# Patient Record
Sex: Female | Born: 1970 | Race: White | Hispanic: No | Marital: Married | Smoking: Former smoker
Health system: Southern US, Community
[De-identification: ages and names within clinical notes are randomized; demographics above are authoritative.]

## PROBLEM LIST (undated history)

## (undated) DIAGNOSIS — G119 Hereditary ataxia, unspecified: Secondary | ICD-10-CM

## (undated) DIAGNOSIS — F909 Attention-deficit hyperactivity disorder, unspecified type: Secondary | ICD-10-CM

## (undated) DIAGNOSIS — B009 Herpesviral infection, unspecified: Secondary | ICD-10-CM

## (undated) DIAGNOSIS — N920 Excessive and frequent menstruation with regular cycle: Secondary | ICD-10-CM

## (undated) DIAGNOSIS — C8599 Non-Hodgkin lymphoma, unspecified, extranodal and solid organ sites: Secondary | ICD-10-CM

## (undated) DIAGNOSIS — F329 Major depressive disorder, single episode, unspecified: Secondary | ICD-10-CM

## (undated) DIAGNOSIS — F17201 Nicotine dependence, unspecified, in remission: Secondary | ICD-10-CM

## (undated) DIAGNOSIS — F419 Anxiety disorder, unspecified: Secondary | ICD-10-CM

## (undated) HISTORY — PX: COLONOSCOPY: SHX174

## (undated) HISTORY — DX: Excessive and frequent menstruation with regular cycle: N92.0

## (undated) HISTORY — DX: Herpesviral infection, unspecified: B00.9

## (undated) HISTORY — DX: Non-Hodgkin lymphoma, unspecified, extranodal and solid organ sites: C85.99

## (undated) HISTORY — DX: Anxiety disorder, unspecified: F41.9

## (undated) HISTORY — DX: Nicotine dependence, unspecified, in remission: F17.201

## (undated) HISTORY — DX: Major depressive disorder, single episode, unspecified: F32.9

## (undated) HISTORY — PX: BASAL CELL CARCINOMA EXCISION: SHX1214

## (undated) HISTORY — PX: TUBAL LIGATION: SHX77

## (undated) HISTORY — DX: Attention-deficit hyperactivity disorder, unspecified type: F90.9

## (undated) HISTORY — DX: Hereditary ataxia, unspecified: G11.9

---

## 1997-12-25 ENCOUNTER — Inpatient Hospital Stay (HOSPITAL_COMMUNITY): Admission: AD | Admit: 1997-12-25 | Discharge: 1997-12-27 | Payer: Self-pay | Admitting: Obstetrics & Gynecology

## 1998-06-22 ENCOUNTER — Emergency Department (HOSPITAL_COMMUNITY): Admission: EM | Admit: 1998-06-22 | Discharge: 1998-06-22 | Payer: Self-pay | Admitting: Emergency Medicine

## 1999-02-15 ENCOUNTER — Other Ambulatory Visit: Admission: RE | Admit: 1999-02-15 | Discharge: 1999-02-15 | Payer: Self-pay | Admitting: Obstetrics and Gynecology

## 1999-09-08 ENCOUNTER — Encounter (INDEPENDENT_AMBULATORY_CARE_PROVIDER_SITE_OTHER): Payer: Self-pay

## 1999-09-08 ENCOUNTER — Ambulatory Visit (HOSPITAL_COMMUNITY): Admission: AD | Admit: 1999-09-08 | Discharge: 1999-09-08 | Payer: Self-pay | Admitting: Obstetrics and Gynecology

## 1999-09-08 ENCOUNTER — Encounter: Payer: Self-pay | Admitting: Obstetrics and Gynecology

## 2000-05-07 ENCOUNTER — Other Ambulatory Visit: Admission: RE | Admit: 2000-05-07 | Discharge: 2000-05-07 | Payer: Self-pay | Admitting: Obstetrics and Gynecology

## 2001-09-09 ENCOUNTER — Inpatient Hospital Stay (HOSPITAL_COMMUNITY): Admission: AD | Admit: 2001-09-09 | Discharge: 2001-09-12 | Payer: Self-pay | Admitting: Obstetrics and Gynecology

## 2001-10-18 ENCOUNTER — Other Ambulatory Visit: Admission: RE | Admit: 2001-10-18 | Discharge: 2001-10-18 | Payer: Self-pay | Admitting: Obstetrics and Gynecology

## 2002-04-28 ENCOUNTER — Ambulatory Visit: Admission: RE | Admit: 2002-04-28 | Discharge: 2002-05-12 | Payer: Self-pay | Admitting: Radiation Oncology

## 2002-09-01 ENCOUNTER — Encounter: Admission: RE | Admit: 2002-09-01 | Discharge: 2002-09-01 | Payer: Self-pay | Admitting: Oncology

## 2002-09-01 ENCOUNTER — Encounter: Payer: Self-pay | Admitting: Oncology

## 2002-12-23 ENCOUNTER — Other Ambulatory Visit: Admission: RE | Admit: 2002-12-23 | Discharge: 2002-12-23 | Payer: Self-pay | Admitting: Obstetrics and Gynecology

## 2004-08-16 ENCOUNTER — Other Ambulatory Visit: Admission: RE | Admit: 2004-08-16 | Discharge: 2004-08-16 | Payer: Self-pay | Admitting: Obstetrics and Gynecology

## 2004-09-08 ENCOUNTER — Ambulatory Visit (HOSPITAL_COMMUNITY): Admission: RE | Admit: 2004-09-08 | Discharge: 2004-09-08 | Payer: Self-pay | Admitting: Obstetrics and Gynecology

## 2005-03-16 ENCOUNTER — Ambulatory Visit: Payer: Self-pay | Admitting: Oncology

## 2005-09-08 ENCOUNTER — Ambulatory Visit: Payer: Self-pay | Admitting: Oncology

## 2006-06-25 ENCOUNTER — Other Ambulatory Visit: Admission: RE | Admit: 2006-06-25 | Discharge: 2006-06-25 | Payer: Self-pay | Admitting: Gynecology

## 2007-07-22 ENCOUNTER — Other Ambulatory Visit: Admission: RE | Admit: 2007-07-22 | Discharge: 2007-07-22 | Payer: Self-pay | Admitting: Gynecology

## 2007-08-28 HISTORY — PX: HYSTEROSCOPY: SHX211

## 2007-09-12 ENCOUNTER — Ambulatory Visit (HOSPITAL_BASED_OUTPATIENT_CLINIC_OR_DEPARTMENT_OTHER): Admission: RE | Admit: 2007-09-12 | Discharge: 2007-09-12 | Payer: Self-pay | Admitting: Gynecology

## 2007-09-12 ENCOUNTER — Encounter: Payer: Self-pay | Admitting: Gynecology

## 2008-03-10 ENCOUNTER — Ambulatory Visit: Payer: Self-pay | Admitting: Gynecology

## 2008-06-04 ENCOUNTER — Ambulatory Visit: Payer: Self-pay | Admitting: Gynecology

## 2008-06-29 HISTORY — PX: ENDOMETRIAL ABLATION: SHX621

## 2008-07-14 ENCOUNTER — Ambulatory Visit: Payer: Self-pay | Admitting: Gynecology

## 2008-07-17 ENCOUNTER — Ambulatory Visit: Payer: Self-pay | Admitting: Gynecology

## 2008-08-06 ENCOUNTER — Ambulatory Visit: Payer: Self-pay | Admitting: Gynecology

## 2008-11-11 ENCOUNTER — Other Ambulatory Visit: Admission: RE | Admit: 2008-11-11 | Discharge: 2008-11-11 | Payer: Self-pay | Admitting: Gynecology

## 2008-11-11 ENCOUNTER — Ambulatory Visit: Payer: Self-pay | Admitting: Gynecology

## 2008-11-11 ENCOUNTER — Encounter: Payer: Self-pay | Admitting: Gynecology

## 2010-07-07 ENCOUNTER — Other Ambulatory Visit (HOSPITAL_COMMUNITY)
Admission: RE | Admit: 2010-07-07 | Discharge: 2010-07-07 | Disposition: A | Discharge status: Home / Self Care | Payer: BC Managed Care – PPO | Source: Ambulatory Visit | Attending: Gynecology | Admitting: Gynecology

## 2010-07-07 ENCOUNTER — Other Ambulatory Visit: Payer: Self-pay | Admitting: Gynecology

## 2010-07-07 ENCOUNTER — Encounter: Payer: BC Managed Care – PPO | Admitting: Gynecology

## 2010-07-07 DIAGNOSIS — Z124 Encounter for screening for malignant neoplasm of cervix: Secondary | ICD-10-CM | POA: Insufficient documentation

## 2010-07-07 DIAGNOSIS — Z833 Family history of diabetes mellitus: Secondary | ICD-10-CM

## 2010-07-07 DIAGNOSIS — R82998 Other abnormal findings in urine: Secondary | ICD-10-CM

## 2010-07-07 DIAGNOSIS — Z01419 Encounter for gynecological examination (general) (routine) without abnormal findings: Secondary | ICD-10-CM

## 2010-07-07 DIAGNOSIS — Z13220 Encounter for screening for lipoid disorders: Secondary | ICD-10-CM

## 2010-07-11 ENCOUNTER — Other Ambulatory Visit: Payer: Self-pay | Admitting: Dermatology

## 2010-10-11 NOTE — Op Note (Signed)
NAMEBRADLEE, [name]               ACCOUNT NO.:  [id number]   MEDICAL RECORD NO.:  [id number]          PATIENT TYPE:  AMB   LOCATION:  [facility]                         FACILITY:  [facility]   PHYSICIAN:  Timothy P. Fontaine, M.D.DATE OF BIRTH:  15-Oct-1970   DATE OF PROCEDURE:  09/12/2007  DATE OF DISCHARGE:                               OPERATIVE REPORT   PREOPERATIVE DIAGNOSES:  1. Post coital bleeding.  2. Menorrhagia.  3. Endometrial polyp.   POSTOPERATIVE DIAGNOSES:  1. Post coital bleeding.  2. Menorrhagia.  3. Endometrial polyp.   PROCEDURE:  1. Hysteroscopic resection of endometrial polyp.  2. D&C.   SURGEON:  Timothy P. Fontaine, M.D.   ANESTHETIC:  General with 1% lidocaine paracervical block.   ESTIMATED BLOOD LOSS:  Minimal sorbitol discrepancy, 35 mL.   COMPLICATIONS:  None.   SPECIMEN:  1. Endometrial curetting.  2. Endometrial polyp.   FINDINGS:  EUA, external BUS and vagina normal.  Cervix normal.  Bimanual uterus normal size, midline mobile.  Adnexa without masses.  Hysteroscopic with a classic endometrial polyp, left upper lateral  cavity; resected in its entirety.  Hysteroscopy otherwise was normal.  Adequate; noting fundus anterior-posterior uterine surfaces, lower  uterine segment, endocervical canal, right and left tubal ostia all  visualized.   PROCEDURE:  The patient was taken to the operating room, underwent  general anesthesia and was placed in the low dorsal lithotomy position.  She received a perineal vaginal preparation with Betadine solution.  The  bladder was emptied with in-and-out Foley catheterization.  EUA  performed.  The patient was draped in the usual fashion.  The cervix was  visualized with a surgical speculum.  The anterior lip was grasped with  a single-tooth tenaculum and a paracervical block using 1% lidocaine  total of 8 mL was placed.  Cervix was then gently and gradually dilated  to admit the operative hysteroscope.   Hysteroscopy was performed with  findings noted above.  Using the right-angle resectoscopic loop, the  endometrial polyp was resected at its base.  Subsequently, a sharp  curettage was performed  without difficulty.  Re-hysteroscopy showed an empty cavity, good  distention, no evidence of perforation.  The instruments were removed.  Hemostasis visualized.  Speculum removed.  The patient placed in the  supine position and awakened without difficulty; taken to the recovery  room in good condition, having tolerated the procedure well.      Timothy P. Fontaine, M.D.  Electronically Signed     TPF/MEDQ  D:  09/12/2007  T:  09/12/2007  Job:  [id number]

## 2010-10-11 NOTE — H&P (Signed)
[name], [name]               ACCOUNT NO.:  [id number]   MEDICAL RECORD NO.:  [id number]          PATIENT TYPE:  AMB   LOCATION:  [facility]                         FACILITY:  [facility]   PHYSICIAN:  Timothy P. Fontaine, M.D.DATE OF BIRTH:  01-Oct-1970   DATE OF ADMISSION:  09/12/2007  DATE OF DISCHARGE:                              HISTORY & PHYSICAL   CHIEF COMPLAINT:  Menorrhagia, postcoital bleeding.   HISTORY OF PRESENT ILLNESS:  A 40 year old G46, P2, AB two female status  post tubal sterilization presents complaining of heavier menses and  postcoital bleeding.  Outpatient evaluation included a sonohysterogram  which showed an endometrial defect consistent with polyp versus small  myomas. She is admitted for hysteroscopy, resection of intracavitary  abnormalities, dilatation and curettage.   PAST MEDICAL HISTORY:  Significant for non-Hodgkin's lymphoma of the  skin.   PAST SURGICAL HISTORY:  Includes laparoscopic bilateral tubal ligation.   ALLERGIES:  No medications.   CURRENT MEDICATIONS:  None.   REVIEW OF SYSTEMS:  Noncontributory.   FAMILY HISTORY:  Noncontributory.   SOCIAL HISTORY:  Noncontributory.   PHYSICAL EXAMINATION:  VITAL SIGNS:  Afebrile.  Vital signs stable.  HEENT: Normal.  LUNGS:  Clear.  CARDIAC:  Regular rate.  No rubs, murmurs or gallops.  ABDOMEN:  Exam benign.  PELVIC:  External BUS, vagina normal.  Cervix normal.  Uterus normal  size, midline mobile, nontender.  Adnexa without masses or tenderness.   ASSESSMENT:  A 40 year old with increasing menorrhagia, postcoital  bleeding.  Sonohysterogram shows an endometrial defect consistent with a  small polyp versus myoma.  The patient is admitted for hysteroscopy,  resection, dilatation and curettage.  I reviewed with the patient the  proposed surgery, expected intraoperative, postoperative courses,  instrumentation, use of the resectoscope and dilatation and curettage.  She understands there are no  guarantees as far as menorrhagia or  postcoital bleeding, that her bleeding may continue irregular heavy.  The risks of bleeding, transfusion, infection, uterine perforation,  damage to internal organs including bowel, bladder, ureters,  vessels and nerves necessitating major exploratory reparative surgeries,  future reparative surgeries, bowel resection, ostomy formation all  discussed, understood and accepted.  The patient's questions were  answered to her satisfaction.  She is ready to proceed with surgery.      Timothy P. Fontaine, M.D.  Electronically Signed     TPF/MEDQ  D:  09/09/2007  T:  09/09/2007  Job:  [id number]

## 2010-10-14 NOTE — Op Note (Signed)
[facility]  Patient:    [name], [name]                      MRN: [id number] Proc. Date: 09/09/99 Adm. Date:  [id number] Attending:  Conley Simmonds A                           Operative Report  PREOPERATIVE DIAGNOSIS:           Missed abortion at [redacted] weeks gestation.  POSTOPERATIVE DIAGNOSIS:          Missed abortion at [redacted] weeks gestation.  OPERATION:                        Examination under anesthesia, dilatation and evacuation.  SURGEON:                          Brook A. Edward Jolly, M.D.  ANESTHESIA:                       MAC, paracervical block with 1% lidocaine.  INTRAVENOUS FLUIDS:               850 cc Ringers lactate.  ESTIMATED BLOOD LOSS:             50 cc.  URINE OUTPUT:                     75 cc prior to the procedure.  COMPLICATIONS:                    None.  INDICATIONS:                      The patient is a 40 year old gravida 3, para 1-0-1-1 Caucasian female at [redacted] weeks gestation by a six-week ultrasound, who presented to the emergency department complaining of brown vaginal spotting, which became red in nature and was accompanied by pressure the evening prior.  The patient had an ultrasound in the office approximately 3 weeks prior, which documented an intrauterine pregnancy with positive fetal cardiac activity. Examination in the emergency department demonstrated clot in the vagina and a closed cervix.  The uterus was noted to be approximately 9 weeks size and was retroverted and nontender.  There was fullness appreciated in the right adnexa region.  The patient had a pelvic ultrasound, which documented an 8+ [redacted] weeks gestation with no fetal cardiac activity appreciated.  No adnexal masses were noted.  The patients hematocrit was 37.8% and her blood type was confirmed to be A+.  The patient was educated to her diagnosis, and a discussion was held with the patient regarding expectant management for ______ dilation and evacuation.   The  patient chose to proceed with a dilation and evacuation after the risks and benefits were reviewed.  FINDINGS:                         Examination under anesthesia revealed an 8-9 eek size retroverted uterus.  No adnexal masses were appreciated.  A moderate amount of products of conception were obtained.  SPECIMENS:                        Products of conception were sent to pathology.  PROCEDURE:  With an IV in place, the patient was taken to the operating room, after she was properly identified.  The patient received ceftazidime 1 g intravenously prior to her surgery.  The patient received MAC anesthesia, and she was then placed in the dorsal lithotomy position.  Her perineum and vagina were sterilely prepped and draped, and the in and out catheterization of the bladder was performed.  Examination under  anesthesia was then performed.  The findings were as noted above.  A speculum was placed inside the vagina, and a single-tooth tenaculum was placed on the anterior cervical lip.  The paracervical block was performed with 10 cc of % lidocaine in standard fashion.  The cervix was serially dilated to a #25 Pratt dilator.  A suction tip curet was then placed into the uterine cavity to the level of the fundus and was slowly withdrawn in a clockwise fashion after suction was  applied.  This was repeated a second time.  The cervix was then sharply curetted and the remaining products of conception were removed.  At the end of this curettage, the endometrium had a characteristically gritty texture to it.  The suction tip curet was placed one final time to remove any remaining clot inside the uterine cavity.  Hemostasis was noted to be excellent at the end of the procedure. The single-tooth tenaculum was removed from the cervix, and the speculum was taken out of the vagina.  The patient was taken out of the dorsal lithotomy position.  She was  escorted to the recovery room in stable and awake condition.  There were no complications to the procedure.  All needle, instrument, and lap counts were correct. DD:  09/09/99 TD:  09/09/99 Job: [id number]

## 2010-10-14 NOTE — Op Note (Signed)
NAMEKRYSTEN, [name]               ACCOUNT NO.:  [id number]   MEDICAL RECORD NO.:  [id number]          PATIENT TYPE:  AMB   LOCATION:  [facility]                           FACILITY:  [facility]   PHYSICIAN:  Malva Limes, M.D.    DATE OF BIRTH:  [birth year]   DATE OF PROCEDURE:  09/08/2004  DATE OF DISCHARGE:                                 OPERATIVE REPORT   PREOPERATIVE DIAGNOSIS:  The patient desires permanent sterilization.   POSTOPERATIVE DIAGNOSIS:  The patient desires permanent sterilization.   PROCEDURE:  Laparoscopic bilateral tubal ligation, application of Hulka  clips.   SURGEON:  Dr. Dareen Piano   ANESTHESIA:  General with local.   SPECIMENS:  None.   ANTIBIOTICS:  Ancef 1 g.   DRAINS:  Red rubber catheter to bladder.   COMPLICATIONS:  None.   ESTIMATED BLOOD LOSS:  Minimal.   DESCRIPTION OF PROCEDURE:  The patient was taken to the operating room,  where a general anesthetic was administered without complications.  She was  then placed in dorsolithotomy position.  She was prepped with Hibiclens then  draped in the usual fashion for this procedure.  A Hulka tenaculum was  applied to the anterior cervical lip.  The umbilicus was injected with 1%  lidocaine.  A vertical skin incision was made.  The Veress needle was placed  in the peritoneal cavity.  Carbon dioxide, 3 L, was then insufflated.  A  trocar was then placed into the peritoneal cavity.  The scope was placed.  The liver and gallbladder appeared to be normal.  The appendix was not  visualized.  The patient had no evidence of any adhesions, endometriosis.  The fallopian tubes and ovaries were normal.  At this point, the Hulka clip  applicator was set up.  A Hulka clip was placed on the left fallopian tube  in the isthmic portion.  The clip was applied perpendicular to the tube.  The entire tube appeared to be within the clasp.  The clasp appeared to be  tightly closed.  A similar procedure was performed on the opposite  side.  At  this point, the procedure was concluded.  The instruments were removed;  pneumoperitoneum was released, and the fascia was  closed with interrupted 0 Vicryl suture and the skin with 4-0 Vicryl suture.  The patient tolerated the procedure well.  She was taken to the recovery  room in stable condition.  Instrument and lap counts correct x 1.  The  patient will be discharged to home with Tylox to take p.r.n.  She will be  instructed to follow up in the office in 4 weeks.      MA/MEDQ  D:  09/08/2004  T:  09/08/2004  Job:  [id number]

## 2011-10-09 ENCOUNTER — Encounter: Payer: Self-pay | Admitting: *Deleted

## 2011-10-12 ENCOUNTER — Encounter: Payer: BC Managed Care – PPO | Admitting: Gynecology

## 2013-01-17 ENCOUNTER — Ambulatory Visit (INDEPENDENT_AMBULATORY_CARE_PROVIDER_SITE_OTHER): Payer: PRIVATE HEALTH INSURANCE | Admitting: Family Medicine

## 2013-01-17 ENCOUNTER — Encounter: Payer: Self-pay | Admitting: Family Medicine

## 2013-01-17 VITALS — BP 116/69 | HR 65 | Temp 99.2°F | Resp 16 | Ht 62.5 in | Wt 133.0 lb

## 2013-01-17 DIAGNOSIS — F330 Major depressive disorder, recurrent, mild: Secondary | ICD-10-CM

## 2013-01-17 DIAGNOSIS — F320 Major depressive disorder, single episode, mild: Secondary | ICD-10-CM | POA: Insufficient documentation

## 2013-01-17 DIAGNOSIS — R5381 Other malaise: Secondary | ICD-10-CM

## 2013-01-17 LAB — CBC WITH DIFFERENTIAL/PLATELET
Basophils Absolute: 0 10*3/uL (ref 0.0–0.1)
Basophils Relative: 0.5 % (ref 0.0–3.0)
Eosinophils Absolute: 0.1 10*3/uL (ref 0.0–0.7)
Eosinophils Relative: 1.7 % (ref 0.0–5.0)
HCT: 38.7 % (ref 36.0–46.0)
Hemoglobin: 13.1 g/dL (ref 12.0–15.0)
Lymphocytes Relative: 29.2 % (ref 12.0–46.0)
Lymphs Abs: 2.1 10*3/uL (ref 0.7–4.0)
MCHC: 33.8 g/dL (ref 30.0–36.0)
MCV: 88.1 fl (ref 78.0–100.0)
Monocytes Absolute: 0.5 10*3/uL (ref 0.1–1.0)
Monocytes Relative: 7.6 % (ref 3.0–12.0)
Neutro Abs: 4.4 10*3/uL (ref 1.4–7.7)
Neutrophils Relative %: 61 % (ref 43.0–77.0)
Platelets: 297 10*3/uL (ref 150.0–400.0)
RBC: 4.39 Mil/uL (ref 3.87–5.11)
RDW: 14.1 % (ref 11.5–14.6)
WBC: 7.2 10*3/uL (ref 4.5–10.5)

## 2013-01-17 LAB — COMPREHENSIVE METABOLIC PANEL
ALT: 16 U/L (ref 0–35)
Alkaline Phosphatase: 89 U/L (ref 39–117)
Creatinine, Ser: 0.6 mg/dL (ref 0.4–1.2)
Glucose, Bld: 85 mg/dL (ref 70–99)
Sodium: 137 mEq/L (ref 135–145)
Total Bilirubin: 0.6 mg/dL (ref 0.3–1.2)
Total Protein: 7 g/dL (ref 6.0–8.3)

## 2013-01-17 LAB — TSH: TSH: 1.34 u[IU]/mL (ref 0.35–5.50)

## 2013-01-17 MED ORDER — CITALOPRAM HYDROBROMIDE 20 MG PO TABS: 20.0000 mg | ORAL_TABLET | Freq: Every day | ORAL | Status: DC

## 2013-01-17 NOTE — Assessment & Plan Note (Addendum)
Start citalopram 20mg  qd trial.  Therapeutic expectations and side effect profile of medication discussed today.  Patient's questions answered. I mentioned the possibility of counseling as add on therapy but will bring this up more at next f/u visit. Other dx to consider: GAD and adult ADHD.

## 2013-01-17 NOTE — Progress Notes (Signed)
Office Note 01/17/2013  CC:  Chief Complaint  Patient presents with  . Establish Care  . Fatigue    HPI:  [name] is a 42 y.o. White female who is here to establish care. Patient's most recent primary MD:  [facility] and Dr. Gita Kudo (GYN)--but has not had medical care for a couple of years per her estimate. Old records in EPIC/HL were reviewed prior to or during today's visit.  Pt reports approx 18 mo hx of feeling sad and depressed.  Says the statement "I don't care" sums up her attitude. Irritable to the point of yelling at husband and children at times, can't concentrate, gets very frustrated b/c can't finish tasks, feels overwhelmed at the end of each day b/c nothing is done, isolates herself and just wants to sleep, appetite is down some, energy level down.  Denies crying spells, SI or HI, or FH of depression. No situation caused her to feel this way.  However, these feelings did lead her to quit a good paying job--"over a small argument/reason".  She has since started working again.   Says she has never sought treatment for this type of thing in the past.  Past Medical History  Diagnosis Date  . HSV-2 (herpes simplex virus 2) infection   . Non-Hodgkin's lymphoma of skin     Initial dx age 80--treated with chemo.  Recurrence x 2 after the birth of each of her children--different chemo regimen used.  No recurrence since age 12 or so.  . Menorrhagia     Regular: 7 days of bleeding (2 heavy and 5 light).    Past Surgical History  Procedure Laterality Date  . Tubal ligation    . Hysteroscopy  08/2007    polyp  . Endometrial ablation  06/2008  . Colonoscopy  appox age [age] yrs    Normal    Family History  Problem Relation Age of Onset  . Cancer Paternal Uncle     colon  . Alcohol abuse Maternal Grandfather   . Cancer Paternal Grandmother   . Heart disease Paternal Grandfather     History   Social History  . Marital Status: Married    Spouse Name:  N/A    Number of Children: N/A  . Years of Education: N/A   Occupational History  . Not on file.   Social History Main Topics  . Smoking status: Former Games developer  . Smokeless tobacco: Never Used  . Alcohol Use: Yes     Comment: social  . Drug Use: No  . Sexual Activity: Yes    Birth Control/ Protection: Surgical   Other Topics Concern  . Not on file   Social History Narrative   Married, 2 children.   Orig from [place] area of [place].   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   Tob 15 pack yr hx--current as of 12/2012.    Alcohol: a few drinks on weekend.   Drugs: none   MEDS: none  No Known Allergies  ROS Review of Systems  Constitutional: Positive for fatigue. Negative for fever.  HENT: Negative for congestion and sore throat.   Eyes: Negative for visual disturbance.  Respiratory: Negative for cough.   Cardiovascular: Negative for chest pain.  Gastrointestinal: Negative for nausea and abdominal pain.  Genitourinary: Negative for dysuria.  Musculoskeletal: Negative for back pain and joint swelling.       Intermittent LBP and shoulder pains  Skin: Negative for rash.  Neurological: Negative for weakness and headaches.  Hematological:  Negative for adenopathy.  Psychiatric/Behavioral:       See HPI     PE; Blood pressure 116/69, pulse 65, temperature 99.2 F (37.3 C), temperature source Temporal, resp. rate 16, height 5' 2.5" (1.588 m), weight 133 lb (60.328 kg), last menstrual period 01/02/2013, SpO2 98.00%. Gen: Alert, well appearing.  Patient is oriented to person, place, time, and situation. AFFECT: pleasant, lucid thought and speech. ENT: Ears: EACs clear, normal epithelium.  TMs with good light reflex and landmarks bilaterally.  Eyes: no injection, icteris, swelling, or exudate.  EOMI, PERRLA. Nose: no drainage or turbinate edema/swelling.  No injection or focal lesion.  Mouth: lips without lesion/swelling.  Oral mucosa pink and moist.  Dentition intact and without obvious caries or  gingival swelling.  Oropharynx without erythema, exudate, or swelling.  Neck - No masses or thyromegaly or limitation in range of motion CV: RRR, no m/r/g.   LUNGS: CTA bilat, nonlabored resps, good aeration in all lung fields. EXT: no clubbing, cyanosis, or edema.  Neuro: CN 2-12 intact bilaterally, strength 5/5 in proximal and distal upper extremities and lower extremities bilaterally.    No tremor.  No ataxia.  Upper extremity and lower extremity DTRs symmetric.  No pronator drift.  Pertinent labs:  None today  ASSESSMENT AND PLAN:   New pt: obtain old records.  Major depressive disorder, single episode, mild Start citalopram 20mg  qd trial.  Therapeutic expectations and side effect profile of medication discussed today.  Patient's questions answered. I mentioned the possibility of counseling as add on therapy but will bring this up more at next f/u visit. Other dx to consider: GAD and adult ADHD.  Also, check CBC, CMET, TSH (fatigue and menorrhagia).  Return in about 1 month (around 02/17/2013) for f/u depression.

## 2013-02-17 ENCOUNTER — Ambulatory Visit (INDEPENDENT_AMBULATORY_CARE_PROVIDER_SITE_OTHER): Payer: PRIVATE HEALTH INSURANCE | Admitting: Family Medicine

## 2013-02-17 ENCOUNTER — Encounter: Payer: Self-pay | Admitting: Family Medicine

## 2013-02-17 VITALS — BP 117/73 | HR 96 | Temp 99.7°F | Resp 16 | Ht 62.0 in | Wt 133.0 lb

## 2013-02-17 DIAGNOSIS — J069 Acute upper respiratory infection, unspecified: Secondary | ICD-10-CM | POA: Insufficient documentation

## 2013-02-17 DIAGNOSIS — Z23 Encounter for immunization: Secondary | ICD-10-CM

## 2013-02-17 DIAGNOSIS — F909 Attention-deficit hyperactivity disorder, unspecified type: Secondary | ICD-10-CM | POA: Insufficient documentation

## 2013-02-17 MED ORDER — METHYLPHENIDATE HCL ER (OSM) 18 MG PO TBCR: 18.0000 mg | EXTENDED_RELEASE_TABLET | ORAL | Status: DC

## 2013-02-17 NOTE — Addendum Note (Signed)
Addended by: Eulah Pont on: 02/17/2013 04:08 PM   Modules accepted: Orders

## 2013-02-17 NOTE — Progress Notes (Signed)
OFFICE NOTE  02/17/2013  CC:  Chief Complaint  Patient presents with  . Depression    stopped med, it made her sleepy  . Cough    x Friday  . Nasal Congestion     HPI: Patient is a 42 y.o. Caucasian female who is here for 1 mo f/u depression. Trial of citalopram 20mg  qd one month ago but she d/c'd this med b/c it made her too sleepy, out of touch and loopy.  Took it for about 2 days total.  Regarding depression sx's, nothing has changed.  Wellbutrin trial in the past didn't work and upset her stomach. She did take her son's concerta 18mg  x 1 day and says this alleviated many of her sx's--less malaise/fatigue, less anhedonia, more focused.  No adverse side effects noted.  Has had some nasal congestion, dry cough x 2-3 d.  No fevers.  No achiness.  No ST.  Mucous is clear.  +HA.  Pertinent PMH:  Past Medical History  Diagnosis Date  . HSV-2 (herpes simplex virus 2) infection   . Non-Hodgkin's lymphoma of skin     Initial dx age 80--treated with chemo.  Recurrence x 2 after the birth of each of her children--different chemo regimen used.  No recurrence since age 22 or so.  . Menorrhagia     Regular: 7 days of bleeding (2 heavy and 5 light).    MEDS:  Mucinex for recent URI sx's --helping  PE: Blood pressure 117/73, pulse 96, temperature 99.7 F (37.6 C), temperature source Temporal, resp. rate 16, height 5\' 2"  (1.575 m), weight 133 lb (60.328 kg), SpO2 100.00%. VS: noted--normal. Gen: alert, NAD, NONTOXIC APPEARING. HEENT: eyes without injection, drainage, or swelling.  Ears: EACs clear, TMs with normal light reflex and landmarks.  Nose: Clear rhinorrhea, with some dried, crusty exudate adherent to mildly injected mucosa.  No purulent d/c.  No paranasal sinus TTP.  No facial swelling.  Throat and mouth without focal lesion.  No pharyngial swelling, erythema, or exudate.   Neck: supple, no LAD.   LUNGS: CTA bilat, nonlabored resps.   CV: RRR, no m/r/g. EXT: no c/c/e SKIN: no  rash  IMPRESSION AND PLAN:  1) Depression + Adult ADD features: improved with concerta 18mg  x 1 day trial. Oversedation/cognitive impairment on citalopram trial. Will do concerta 18mg  qd x 40mo, add antidepressant in future if leftover depression sx's remain. Therapeutic expectations and side effect profile of medication discussed today.  Patient's questions answered.  2) Viral URI. Trial of mucinex DM or robitussin DM otc as directed on the box. May use OTC nasal saline spray or irrigation solution bid. OTC nonsedating antihistamines prn discussed.  Decongestant use discussed--ok if tolerated in the past w/out side effect and if pt has no hx of HTN.  Flu vaccine IM today.   FOLLOW UP: 1 mo

## 2013-03-21 ENCOUNTER — Ambulatory Visit: Payer: PRIVATE HEALTH INSURANCE | Admitting: Family Medicine

## 2013-03-25 ENCOUNTER — Telehealth: Payer: Self-pay | Admitting: Family Medicine

## 2013-03-25 MED ORDER — METHYLPHENIDATE HCL ER (OSM) 18 MG PO TBCR: 18.0000 mg | EXTENDED_RELEASE_TABLET | ORAL | Status: DC

## 2013-03-25 NOTE — Telephone Encounter (Signed)
Yes, ok to reschedule. I printed rx for 1 mo supply, concerta 18mg  caps.

## 2013-03-25 NOTE — Telephone Encounter (Signed)
Patient was last seen on 02/17/13.  Please advise.

## 2013-03-25 NOTE — Telephone Encounter (Signed)
Patient has lost her insurance. She needs a refill. Can she get a refill & reschedule her appt for when she gets insurance?

## 2013-03-26 ENCOUNTER — Ambulatory Visit: Payer: PRIVATE HEALTH INSURANCE | Admitting: Family Medicine

## 2013-08-01 ENCOUNTER — Ambulatory Visit (INDEPENDENT_AMBULATORY_CARE_PROVIDER_SITE_OTHER): Payer: 59 | Admitting: Family Medicine

## 2013-08-01 ENCOUNTER — Encounter: Payer: Self-pay | Admitting: Family Medicine

## 2013-08-01 VITALS — BP 107/71 | HR 85 | Temp 99.0°F | Resp 16 | Ht 62.0 in | Wt 143.0 lb

## 2013-08-01 DIAGNOSIS — F329 Major depressive disorder, single episode, unspecified: Secondary | ICD-10-CM

## 2013-08-01 DIAGNOSIS — F341 Dysthymic disorder: Secondary | ICD-10-CM

## 2013-08-01 DIAGNOSIS — F419 Anxiety disorder, unspecified: Secondary | ICD-10-CM

## 2013-08-01 DIAGNOSIS — F909 Attention-deficit hyperactivity disorder, unspecified type: Secondary | ICD-10-CM

## 2013-08-01 MED ORDER — METHYLPHENIDATE HCL ER (OSM) 18 MG PO TBCR: EXTENDED_RELEASE_TABLET | ORAL | Status: DC

## 2013-08-01 MED ORDER — METHYLPHENIDATE HCL ER (OSM) 36 MG PO TBCR: 36.0000 mg | EXTENDED_RELEASE_TABLET | Freq: Every day | ORAL | Status: DC

## 2013-08-01 NOTE — Progress Notes (Signed)
Pre visit review using our clinic review tool, if applicable. No additional management support is needed unless otherwise documented below in the visit note. 

## 2013-08-01 NOTE — Progress Notes (Signed)
OFFICE NOTE  08/01/2013  CC:  Chief Complaint  Patient presents with  . Medication Refill     HPI: Patient is a 43 y.o. Caucasian female who is here for f/u hx of adult ADD, depression. Did well on concerta 18mg  in the past (01/2013) but we never got to 36mg  dosing b/c pt lost insurance. She notes return of same sx's: poor focus, short term memory issues, distracted very easily, easily overwhelmed with complex tasks, frustrated easily, feels "worked up" on the inside much of the time. Still some sadness/depression features when on 18mg  dosing but this had improved compared to being on no concerta.  No SI or HI.  Feeling well physically.   Pertinent PMH:  Past Medical History  Diagnosis Date  . HSV-2 (herpes simplex virus 2) infection   . Non-Hodgkin's lymphoma of skin     Initial dx age 68--treated with chemo.  Recurrence x 2 after the birth of each of her children--different chemo regimen used.  No recurrence since age 76 or so.  . Menorrhagia     Regular: 7 days of bleeding (2 heavy and 5 light).  Tobacco dependence  MEDS:  NONE  PE: Blood pressure 107/71, pulse 85, temperature 99 F (37.2 C), temperature source Temporal, resp. rate 16, height 5\' 2"  (1.575 m), weight 143 lb (64.864 kg), SpO2 100.00%. Wt Readings from Last 2 Encounters:  08/01/13 143 lb (64.864 kg)  02/17/13 133 lb (60.328 kg)    Gen: alert, oriented x 4, affect pleasant.  Lucid thinking and conversation noted. HEENT: PERRLA, EOMI.   Neck: no LAD, mass, or thyromegaly. CV: RRR, no m/r/g LUNGS: CTA bilat, nonlabored. NEURO: no tremor or tics noted on observation.  Coordination intact. CN 2-12 grossly intact bilaterally, strength 5/5 in all extremeties.  No ataxia.   IMPRESSION AND PLAN:  ADD, some depression features. Will restart concerta at 18 mg qd x 10G, titrate to 36 mg qd at that time. Additional rx given today for 36mg  caps, #30, to fill in 20d when 18mg  caps are finished. If ADD sx's fully  controlled and still with depression/sadness then will consider trial of wellbutrin to help with this and with focus AND with smoking cessation attempt.  FOLLOW UP: 6 wks

## 2013-09-10 ENCOUNTER — Encounter: Payer: Self-pay | Admitting: Family Medicine

## 2013-09-10 ENCOUNTER — Ambulatory Visit (INDEPENDENT_AMBULATORY_CARE_PROVIDER_SITE_OTHER): Payer: 59 | Admitting: Family Medicine

## 2013-09-10 VITALS — BP 111/65 | HR 89 | Temp 98.6°F | Resp 18 | Ht 62.0 in | Wt 149.0 lb

## 2013-09-10 DIAGNOSIS — F341 Dysthymic disorder: Secondary | ICD-10-CM

## 2013-09-10 DIAGNOSIS — F419 Anxiety disorder, unspecified: Secondary | ICD-10-CM

## 2013-09-10 DIAGNOSIS — F909 Attention-deficit hyperactivity disorder, unspecified type: Secondary | ICD-10-CM

## 2013-09-10 DIAGNOSIS — F17200 Nicotine dependence, unspecified, uncomplicated: Secondary | ICD-10-CM | POA: Insufficient documentation

## 2013-09-10 DIAGNOSIS — F329 Major depressive disorder, single episode, unspecified: Secondary | ICD-10-CM

## 2013-09-10 DIAGNOSIS — F32A Depression, unspecified: Secondary | ICD-10-CM

## 2013-09-10 MED ORDER — BUPROPION HCL ER (XL) 150 MG PO TB24: 150.0000 mg | ORAL_TABLET | Freq: Every day | ORAL | Status: DC

## 2013-09-10 NOTE — Progress Notes (Signed)
Pre visit review using our clinic review tool, if applicable. No additional management support is needed unless otherwise documented below in the visit note. 

## 2013-09-10 NOTE — Progress Notes (Signed)
OFFICE NOTE  09/10/2013  CC:  Chief Complaint  Patient presents with  . Follow-up  . Medication Problem    causing GI issues     HPI: Patient is a 43 y.o. Caucasian female who is here for discussion of medication problem. Restarted concerta 18mg  qd about 5 wks ago, not much help.  Titrated to 36mg  dosing and felt "sleepy, tired, and irritable, bloating and constipation".  As med wears off at night she gets emotional.  Got to sleep ok.  Appetite was actually increased on this med. Still feels mild chronic depressed mood but no hopelessness, no excessive guilt or anger, no SI or HI.  ROS: no headache, no n/v/d, no palpitations, no tremor/tics  Smoking cigs about the same.  Pertinent PMH:  Past medical, surgical hx reviewed.  MEDS:  Outpatient Prescriptions Prior to Visit  Medication Sig Dispense Refill  . methylphenidate (CONCERTA) 36 MG CR tablet Take 1 tablet (36 mg total) by mouth daily.  30 tablet  0   No facility-administered medications prior to visit.    PE: Blood pressure 111/65, pulse 89, temperature 98.6 F (37 C), temperature source Temporal, resp. rate 18, height 5\' 2"  (1.575 m), weight 149 lb (67.586 kg), SpO2 100.00%. Wt Readings from Last 2 Encounters:  09/10/13 149 lb (67.586 kg)  08/01/13 143 lb (64.864 kg)    Gen: alert, oriented x 4, affect pleasant.  Lucid thinking and conversation noted. HEENT: PERRLA, EOMI.   Neck: no LAD, mass, or thyromegaly. CV: RRR, no m/r/g LUNGS: CTA bilat, nonlabored. NEURO: no tremor or tics noted on observation.  Coordination intact. CN 2-12 grossly intact bilaterally, strength 5/5 in all extremeties.  No ataxia.   IMPRESSION AND PLAN:  Adult ADD with depression features as well. Failed concerta trial. Will do trial of wellbutrin for ADD and antidepressant effect, also for smoking cessation aid. Wellbutrin XL generic 150mg , 1 qd, #30, RF x 1.  An After Visit Summary was printed and given to the patient.  FOLLOW  UP: 4-6 wks

## 2013-10-03 ENCOUNTER — Telehealth: Payer: Self-pay | Admitting: Family Medicine

## 2013-10-03 NOTE — Telephone Encounter (Signed)
Patient lmom stating that the Wellbutrin doesn't seem to be helping her.  She wanted to know if you could write another Rx for concerta or vyvanse.  Please advise.

## 2013-10-06 MED ORDER — METHYLPHENIDATE HCL ER (OSM) 18 MG PO TBCR: 18.0000 mg | EXTENDED_RELEASE_TABLET | Freq: Every day | ORAL | Status: DC

## 2013-10-06 NOTE — Telephone Encounter (Signed)
Needs to give wellbutrin more time, so I don't want her to stop it. However, I will add back the 18mg  daily dose of concerta for her to take along with the wellbutrin. Concerta rx printed.-thx

## 2013-10-06 NOTE — Telephone Encounter (Signed)
Pt aware.  Rx up front for her to pick up.

## 2013-10-24 ENCOUNTER — Encounter: Payer: Self-pay | Admitting: Family Medicine

## 2013-10-24 ENCOUNTER — Ambulatory Visit (INDEPENDENT_AMBULATORY_CARE_PROVIDER_SITE_OTHER): Payer: BC Managed Care – PPO | Admitting: Family Medicine

## 2013-10-24 VITALS — BP 106/67 | HR 82 | Temp 97.9°F | Resp 18 | Ht 62.0 in | Wt 148.0 lb

## 2013-10-24 DIAGNOSIS — F17200 Nicotine dependence, unspecified, uncomplicated: Secondary | ICD-10-CM

## 2013-10-24 DIAGNOSIS — F909 Attention-deficit hyperactivity disorder, unspecified type: Secondary | ICD-10-CM

## 2013-10-24 MED ORDER — LISDEXAMFETAMINE DIMESYLATE 20 MG PO CAPS: ORAL_CAPSULE | ORAL | Status: DC

## 2013-10-24 NOTE — Progress Notes (Signed)
OFFICE NOTE  10/24/2013  CC:  Chief Complaint  Patient presents with  . Follow-up     HPI: Patient is a 43 y.o. Caucasian female who is here for 6 wk f/u for adult ADD.  Hx of mild depressive sx's/dysthymia. Wellbutrin caused fatigue and worse depression so she eventually stopped it about 1 wk ago and feels much improved. Concerta 18mg  qd restart has helped minimally.  She has had hx of responding well to this med in its generic form. She has not been on adderall or vyvanse and wants to do trial of vyvanse. No concerta taken today.  She is still smoking.   Pertinent PMH:  Past Medical History  Diagnosis Date  . HSV-2 (herpes simplex virus 2) infection   . Non-Hodgkin's lymphoma of skin     Initial dx age 43--treated with chemo.  Recurrence x 2 after the birth of each of her children--different chemo regimen used.  No recurrence since age 57 or so.  . Menorrhagia     Regular: 7 days of bleeding (2 heavy and 5 light).  . Adult ADHD   . Tobacco dependence   . Anxiety and depression    PSH: reviewed.  No changes.  History   Social History Narrative   Married, 2 children.   Orig from [place] area of [place].   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   Tob 15 pack yr hx--current as of 12/2012.    Alcohol: a few drinks on weekend.   Drugs: none    MEDS:  Outpatient Prescriptions Prior to Visit  Medication Sig Dispense Refill  . methylphenidate (CONCERTA) 18 MG PO CR tablet Take 1 tablet (18 mg total) by mouth daily.  30 tablet  0  . buPROPion (WELLBUTRIN XL) 150 MG 24 hr tablet Take 1 tablet (150 mg total) by mouth daily.  30 tablet  1   No facility-administered medications prior to visit.    PE: Blood pressure 106/67, pulse 82, temperature 97.9 F (36.6 C), temperature source Oral, resp. rate 18, height 5\' 2"  (1.575 m), weight 148 lb (67.132 kg), SpO2 96.00%. Wt Readings from Last 2 Encounters:  10/24/13 148 lb (67.132 kg)  09/10/13 149 lb (67.586 kg)    Gen: alert, oriented x 4, affect pleasant.  Lucid thinking and conversation noted. HEENT: PERRLA, EOMI.   Neck: no LAD, mass, or thyromegaly. CV: RRR, no m/r/g LUNGS: CTA bilat, nonlabored. NEURO: no tremor or tics noted on observation.  Coordination intact. CN 2-12 grossly intact bilaterally, strength 5/5 in all extremeties.  No ataxia.   IMPRESSION AND PLAN:  Adult ADD with mild dysthymia. Inconsistent response to concerta--? Generic better than brand (brand name required by insurer). Failed wellbutrin (made her worse). Will do trial of vyvanse 20mg  qd x 5d, then 40mg  qd. Therapeutic expectations and side effect profile of medication discussed today.  Patient's questions answered. F/u 1 mo.  Tob dependence: for now, she'll continue to work on cutting back.  Once ADD med stable, will possibly do trial of chantix.  An After Visit Summary was printed and given to the patient.

## 2013-10-24 NOTE — Progress Notes (Signed)
Pre visit review using our clinic review tool, if applicable. No additional management support is needed unless otherwise documented below in the visit note. 

## 2013-10-24 NOTE — Addendum Note (Signed)
Addended by: Tammi Sou on: 10/24/2013 08:26 AM   Modules accepted: Orders, Medications

## 2013-11-26 ENCOUNTER — Encounter: Payer: Self-pay | Admitting: Family Medicine

## 2013-11-26 ENCOUNTER — Ambulatory Visit (INDEPENDENT_AMBULATORY_CARE_PROVIDER_SITE_OTHER): Payer: BC Managed Care – PPO | Admitting: Family Medicine

## 2013-11-26 VITALS — BP 102/68 | HR 82 | Temp 99.0°F | Resp 18 | Ht 62.0 in | Wt 149.0 lb

## 2013-11-26 DIAGNOSIS — F17200 Nicotine dependence, unspecified, uncomplicated: Secondary | ICD-10-CM

## 2013-11-26 DIAGNOSIS — F909 Attention-deficit hyperactivity disorder, unspecified type: Secondary | ICD-10-CM

## 2013-11-26 MED ORDER — LISDEXAMFETAMINE DIMESYLATE 40 MG PO CAPS: 40.0000 mg | ORAL_CAPSULE | ORAL | Status: DC

## 2013-11-26 NOTE — Progress Notes (Signed)
Pre visit review using our clinic review tool, if applicable. No additional management support is needed unless otherwise documented below in the visit note. 

## 2013-11-26 NOTE — Progress Notes (Signed)
OFFICE NOTE  11/26/2013  CC:  Chief Complaint  Patient presents with  . Follow-up   HPI: Patient is a 43 y.o. Caucasian female who is here for 1 mo f/u adult ADD with dysthymic features. Started vyvanse 20mg  qd (and titrated to 40 mg qd) last visit.  Much improved, about 75% improved! Dry mouth is only side effect. We've been discussing tobacco cessation: failed trial of wellbutrin--made her depression worse. Still smoking about 1/2 pack per day.  Pertinent PMH:  Past medical, surgical, social, and family history reviewed and no changes are noted since last office visit.  MEDS:  Outpatient Prescriptions Prior to Visit  Medication Sig Dispense Refill  . lisdexamfetamine (VYVANSE) 20 MG capsule 2 caps po qAM  60 capsule  0   No facility-administered medications prior to visit.    PE: Blood pressure 102/68, pulse 82, temperature 99 F (37.2 C), temperature source Temporal, resp. rate 18, height 5\' 2"  (1.575 m), weight 149 lb (67.586 kg), last menstrual period 11/10/2013, SpO2 97.00%. Wt Readings from Last 2 Encounters:  11/26/13 149 lb (67.586 kg)  10/24/13 148 lb (67.132 kg)    Gen: alert, oriented x 4, affect pleasant.  Lucid thinking and conversation noted. HEENT: PERRLA, EOMI.   Neck: no LAD, mass, or thyromegaly. CV: RRR, no m/r/g LUNGS: CTA bilat, nonlabored. NEURO: no tremor or tics noted on observation.  Coordination intact. CN 2-12 grossly intact bilaterally, strength 5/5 in all extremeties.  No ataxia.   IMPRESSION AND PLAN:  Adult ADD; doing great on vyvanse 40mg  qd. I printed rx's for vyvanse 40mg  caps, 1 cap po qd, #30- today for this month, August, and September 2015.  Appropriate fill on/after date was noted on each rx.  Tob dependence: continue to try to cut back.  At next f/u if still doing well from psychADD standpoint then we'll do a trial of chantix.  An After Visit Summary was printed and given to the patient.  FOLLOW UP: 78mo

## 2014-03-06 ENCOUNTER — Encounter: Payer: Self-pay | Admitting: Family Medicine

## 2014-03-06 ENCOUNTER — Ambulatory Visit (INDEPENDENT_AMBULATORY_CARE_PROVIDER_SITE_OTHER): Payer: BC Managed Care – PPO | Admitting: Family Medicine

## 2014-03-06 VITALS — BP 107/69 | HR 81 | Temp 99.1°F | Resp 16 | Ht 62.0 in | Wt 146.0 lb

## 2014-03-06 DIAGNOSIS — F909 Attention-deficit hyperactivity disorder, unspecified type: Secondary | ICD-10-CM

## 2014-03-06 DIAGNOSIS — F17200 Nicotine dependence, unspecified, uncomplicated: Secondary | ICD-10-CM

## 2014-03-06 DIAGNOSIS — F900 Attention-deficit hyperactivity disorder, predominantly inattentive type: Secondary | ICD-10-CM

## 2014-03-06 MED ORDER — VARENICLINE TARTRATE 1 MG PO TABS: 1.0000 mg | ORAL_TABLET | Freq: Two times a day (BID) | ORAL | Status: DC

## 2014-03-06 MED ORDER — LISDEXAMFETAMINE DIMESYLATE 40 MG PO CAPS: 40.0000 mg | ORAL_CAPSULE | ORAL | Status: DC

## 2014-03-06 MED ORDER — VARENICLINE TARTRATE 0.5 MG X 11 & 1 MG X 42 PO MISC: ORAL | Status: DC

## 2014-03-06 NOTE — Progress Notes (Signed)
OFFICE NOTE  03/06/2014  CC:  Chief Complaint  Patient presents with  . Follow-up   HPI: Patient is a 43 y.o. Caucasian female who is here for 3 mo f/u adult ADD. Doing well still, no side effects.  Still smoking about 1 ppd, wants to try chantix now.  Failed wellbutrin in recent past (worsened depression). Chantix helped in the past but did cause some dreaming and/or nausea.   She is at the point of wanting to try this med again.  Pertinent PMH:  Past medical, surgical, social, and family history reviewed and no changes are noted since last office visit.  MEDS:  Outpatient Prescriptions Prior to Visit  Medication Sig Dispense Refill  . lisdexamfetamine (VYVANSE) 40 MG capsule Take 1 capsule (40 mg total) by mouth every morning.  30 capsule  0   No facility-administered medications prior to visit.    PE: Blood pressure 107/69, pulse 81, temperature 99.1 F (37.3 C), temperature source Temporal, resp. rate 16, height 5\' 2"  (1.575 m), weight 146 lb (66.225 kg), SpO2 99.00%. Wt Readings from Last 2 Encounters:  03/06/14 146 lb (66.225 kg)  11/26/13 149 lb (67.586 kg)    Gen: alert, oriented x 4, affect pleasant.  Lucid thinking and conversation noted. HEENT: PERRLA, EOMI.   Neck: no LAD, mass, or thyromegaly. CV: RRR, no m/r/g LUNGS: CTA bilat, nonlabored. NEURO: no tremor or tics noted on observation.  Coordination intact. CN 2-12 grossly intact bilaterally, strength 5/5 in all extremeties.  No ataxia.   IMPRESSION AND PLAN:  1) Adult ADD: The current medical regimen is effective;  continue present plan and medications. I printed rx's for vyvanse 40mg  qd, #30 today for this month, November, December 2015.  Appropriate fill on/after date was noted on each rx.  2) Tobacco dependence: trial of chantix.  Starter pack rx handed to pt.  Therapeutic expectations and side effect profile of medication discussed today.  Patient's questions answered. Continuation pack for the  following 2 months sent via eRx sent to pharmacy.  Pt declined flu vaccine today.  An After Visit Summary was printed and given to the patient.  FOLLOW UP: 3-4 mo adult ADD

## 2014-03-06 NOTE — Progress Notes (Signed)
Pre visit review using our clinic review tool, if applicable. No additional management support is needed unless otherwise documented below in the visit note. 

## 2014-03-30 ENCOUNTER — Encounter: Payer: Self-pay | Admitting: Family Medicine

## 2014-06-08 ENCOUNTER — Ambulatory Visit (INDEPENDENT_AMBULATORY_CARE_PROVIDER_SITE_OTHER): Payer: BLUE CROSS/BLUE SHIELD | Admitting: Family Medicine

## 2014-06-08 ENCOUNTER — Encounter: Payer: Self-pay | Admitting: Family Medicine

## 2014-06-08 VITALS — BP 99/64 | HR 74 | Temp 99.0°F | Resp 16 | Ht 62.0 in | Wt 149.0 lb

## 2014-06-08 DIAGNOSIS — F900 Attention-deficit hyperactivity disorder, predominantly inattentive type: Secondary | ICD-10-CM

## 2014-06-08 DIAGNOSIS — F418 Other specified anxiety disorders: Secondary | ICD-10-CM

## 2014-06-08 DIAGNOSIS — F329 Major depressive disorder, single episode, unspecified: Secondary | ICD-10-CM

## 2014-06-08 DIAGNOSIS — F909 Attention-deficit hyperactivity disorder, unspecified type: Secondary | ICD-10-CM

## 2014-06-08 DIAGNOSIS — Z716 Tobacco abuse counseling: Secondary | ICD-10-CM

## 2014-06-08 DIAGNOSIS — F419 Anxiety disorder, unspecified: Secondary | ICD-10-CM

## 2014-06-08 MED ORDER — LISDEXAMFETAMINE DIMESYLATE 50 MG PO CAPS: 50.0000 mg | ORAL_CAPSULE | Freq: Every day | ORAL | Status: DC

## 2014-06-08 NOTE — Progress Notes (Signed)
OFFICE NOTE  06/08/2014  CC:  Chief Complaint  Patient presents with  . Follow-up   HPI: Patient is a 44 y.o.  female who is here for 3 mo f/u adult ADD, also with hx of anxiety/dep. Tob cess: tried chantix last visit: she has completely quit smoking!  No side effects from the vyvanse, but she does feel that concentration and focus on many days is inadequate on the 40mg  qd dosing.   Pertinent PMH:  Past medical, surgical, social, and family history reviewed and no changes are noted since last office visit.  MEDS:  Outpatient Prescriptions Prior to Visit  Medication Sig Dispense Refill  . lisdexamfetamine (VYVANSE) 40 MG capsule Take 1 capsule (40 mg total) by mouth every morning. 30 capsule 0  . varenicline (CHANTIX CONTINUING MONTH PAK) 1 MG tablet Take 1 tablet (1 mg total) by mouth 2 (two) times daily. (Patient not taking: Reported on 06/08/2014) 60 tablet 1  . varenicline (CHANTIX STARTING MONTH PAK) 0.5 MG X 11 & 1 MG X 42 tablet Take one 0.5 mg tablet by mouth once daily for 3 days, then increase to one 0.5 mg tablet twice daily for 4 days, then increase to one 1 mg tablet twice daily. (Patient not taking: Reported on 06/08/2014) 53 tablet 0   No facility-administered medications prior to visit.    PE: Blood pressure 99/64, pulse 74, temperature 99 F (37.2 C), temperature source Temporal, resp. rate 16, height 5\' 2"  (1.575 m), weight 149 lb (67.586 kg), SpO2 100 %. Wt Readings from Last 2 Encounters:  06/08/14 149 lb (67.586 kg)  03/06/14 146 lb (66.225 kg)    Gen: alert, oriented x 4, affect pleasant.  Lucid thinking and conversation noted. HEENT: PERRLA, EOMI.   Neck: no LAD, mass, or thyromegaly. CV: RRR, no m/r/g LUNGS: CTA bilat, nonlabored. NEURO: no tremor or tics noted on observation.  Coordination intact. CN 2-12 grossly intact bilaterally, strength 5/5 in all extremeties.  No ataxia.   IMPRESSION AND PLAN:  1) Adult ADHD; we'll increase her vyvanse to  50mg  qd dosing.  Therapeutic expectations and side effect profile of medication discussed today.  Patient's questions answered. I printed rx's for Vyvanse 50mg , 1 qd, #30, today for this month, Feb, and March 2016.  Appropriate fill on/after date was noted on each rx.  2) Tob cessation; successful on chantix!  3) Anxiety/depression: in remission.  An After Visit Summary was printed and given to the patient.  FOLLOW UP: 40mo

## 2014-06-08 NOTE — Progress Notes (Signed)
Pre visit review using our clinic review tool, if applicable. No additional management support is needed unless otherwise documented below in the visit note. 

## 2014-09-11 ENCOUNTER — Telehealth: Payer: Self-pay | Admitting: Family Medicine

## 2014-09-11 MED ORDER — LISDEXAMFETAMINE DIMESYLATE 50 MG PO CAPS: 50.0000 mg | ORAL_CAPSULE | Freq: Every day | ORAL | Status: DC

## 2014-09-11 NOTE — Telephone Encounter (Signed)
Pt called and needs a refill on her Zyzanse. She is scheduled to be seen April 20 @ 8:00am.

## 2014-09-11 NOTE — Telephone Encounter (Signed)
Left detailed message on pt's vm. Per dpr.

## 2014-09-11 NOTE — Telephone Encounter (Signed)
Vyvanse rx printed. 

## 2014-09-11 NOTE — Telephone Encounter (Signed)
Refill request for Vyvanse Last filled by MD on- 06/08/14 #30 x0 Last Appt: 06/08/14 Next Appt: 09/16/14 Please advise refill?

## 2014-09-16 ENCOUNTER — Encounter: Payer: Self-pay | Admitting: Family Medicine

## 2014-09-16 ENCOUNTER — Ambulatory Visit (INDEPENDENT_AMBULATORY_CARE_PROVIDER_SITE_OTHER): Payer: BLUE CROSS/BLUE SHIELD | Admitting: Family Medicine

## 2014-09-16 VITALS — BP 104/64 | HR 71 | Temp 98.3°F | Resp 18 | Ht 62.0 in | Wt 154.0 lb

## 2014-09-16 DIAGNOSIS — L989 Disorder of the skin and subcutaneous tissue, unspecified: Secondary | ICD-10-CM | POA: Diagnosis not present

## 2014-09-16 DIAGNOSIS — F909 Attention-deficit hyperactivity disorder, unspecified type: Secondary | ICD-10-CM

## 2014-09-16 DIAGNOSIS — F900 Attention-deficit hyperactivity disorder, predominantly inattentive type: Secondary | ICD-10-CM

## 2014-09-16 DIAGNOSIS — Z8572 Personal history of non-Hodgkin lymphomas: Secondary | ICD-10-CM | POA: Diagnosis not present

## 2014-09-16 MED ORDER — LISDEXAMFETAMINE DIMESYLATE 50 MG PO CAPS: 50.0000 mg | ORAL_CAPSULE | Freq: Every day | ORAL | Status: DC

## 2014-09-16 NOTE — Progress Notes (Signed)
OFFICE NOTE  09/16/2014  CC:  Chief Complaint  Patient presents with  . Follow-up   HPI: Patient is a 44 y.o. Caucasian female who is here for 3 mo f/u adult ADHD. Responding well to med, feels like dose is approp. No adverse side effects. Feels like her anxiety and depression are not an issue right now.  Has a couple spots on her back she wants me to look at, has hx of NHL of skin, most recently treated about 12 yrs ago--at [facility] at [facility].  She has noted these spots on her back, noted in the last month or so, without pain or itching.  She says they look like her past NHL skin lesions.  Pertinent PMH:  Past medical, surgical, social, and family history reviewed and no changes are noted since last office visit.  MEDS:  Outpatient Prescriptions Prior to Visit  Medication Sig Dispense Refill  . lisdexamfetamine (VYVANSE) 50 MG capsule Take 1 capsule (50 mg total) by mouth daily. 30 capsule 0   No facility-administered medications prior to visit.    PE: Blood pressure 104/64, pulse 71, temperature 98.3 F (36.8 C), temperature source Temporal, resp. rate 18, height 5\' 2"  (1.575 m), weight 154 lb (69.854 kg), SpO2 98 %. Wt Readings from Last 2 Encounters:  09/16/14 154 lb (69.854 kg)  06/08/14 149 lb (67.586 kg)    Gen: alert, oriented x 4, affect pleasant.  Lucid thinking and conversation noted. HEENT: PERRLA, EOMI.   Neck: no LAD, mass, or thyromegaly. CV: RRR, no m/r/g LUNGS: CTA bilat, nonlabored. NEURO: no tremor or tics noted on observation.  Coordination intact. CN 2-12 grossly intact bilaterally, strength 5/5 in all extremeties.  No ataxia. SKIN: in midline T spine area and also T/L spine intersection she has a couple of pink, blanchable, raised areas of skin that are nontender and are soft.    IMPRESSION AND PLAN:  1) Adult ADHD; The current medical regimen is effective;  continue present plan and medications. I printed rx's for Vyvanse 50mg , 1 tab po  qd, #30 today for this month, May 2016, and June 2016.  Appropriate fill on/after date was noted on each rx.  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  If appt is going to be >2 wks away then I'll have her return and we'll do a skin biopsy of one of the lesions here.  An After Visit Summary was printed and given to the patient.  FOLLOW UP: 4 mo

## 2014-09-21 ENCOUNTER — Telehealth: Payer: Self-pay | Admitting: Oncology

## 2014-09-21 NOTE — Telephone Encounter (Signed)
NEW PATIENT REFERRAL-LEFT MESSAGE FOR PATIENT TO RETURN CALL TO SCHEDULE NP APPT °

## 2014-09-22 ENCOUNTER — Telehealth: Payer: Self-pay | Admitting: Oncology

## 2014-09-22 NOTE — Telephone Encounter (Signed)
new patient referral-s/w patient and gve appt for 05/23 @ 4 w.Dr. Jana Hakim

## 2014-10-19 ENCOUNTER — Ambulatory Visit: Payer: BLUE CROSS/BLUE SHIELD

## 2014-10-19 ENCOUNTER — Ambulatory Visit (HOSPITAL_BASED_OUTPATIENT_CLINIC_OR_DEPARTMENT_OTHER): Payer: BLUE CROSS/BLUE SHIELD

## 2014-10-19 ENCOUNTER — Encounter: Payer: Self-pay | Admitting: Oncology

## 2014-10-19 ENCOUNTER — Ambulatory Visit (HOSPITAL_BASED_OUTPATIENT_CLINIC_OR_DEPARTMENT_OTHER): Payer: BLUE CROSS/BLUE SHIELD | Admitting: Oncology

## 2014-10-19 VITALS — BP 116/48 | HR 82 | Temp 98.9°F | Resp 20 | Ht 62.0 in | Wt 151.5 lb

## 2014-10-19 DIAGNOSIS — C8590 Non-Hodgkin lymphoma, unspecified, unspecified site: Secondary | ICD-10-CM

## 2014-10-19 DIAGNOSIS — C8519 Unspecified B-cell lymphoma, extranodal and solid organ sites: Secondary | ICD-10-CM | POA: Insufficient documentation

## 2014-10-19 DIAGNOSIS — L989 Disorder of the skin and subcutaneous tissue, unspecified: Secondary | ICD-10-CM

## 2014-10-19 DIAGNOSIS — R61 Generalized hyperhidrosis: Secondary | ICD-10-CM

## 2014-10-19 DIAGNOSIS — Z8572 Personal history of non-Hodgkin lymphomas: Secondary | ICD-10-CM

## 2014-10-19 LAB — CBC WITH DIFFERENTIAL/PLATELET
BASO%: 0.3 % (ref 0.0–2.0)
Basophils Absolute: 0 10*3/uL (ref 0.0–0.1)
EOS%: 2.5 % (ref 0.0–7.0)
Eosinophils Absolute: 0.3 10*3/uL (ref 0.0–0.5)
HCT: 40.2 % (ref 34.8–46.6)
HGB: 13.3 g/dL (ref 11.6–15.9)
LYMPH%: 9.2 % — ABNORMAL LOW (ref 14.0–49.7)
MCH: 29.8 pg (ref 25.1–34.0)
MCHC: 33.2 g/dL (ref 31.5–36.0)
MCV: 89.9 fL (ref 79.5–101.0)
MONO#: 0.8 10*3/uL (ref 0.1–0.9)
MONO%: 6.3 % (ref 0.0–14.0)
NEUT#: 10.1 10*3/uL — ABNORMAL HIGH (ref 1.5–6.5)
NEUT%: 81.7 % — ABNORMAL HIGH (ref 38.4–76.8)
Platelets: 302 10*3/uL (ref 145–400)
RBC: 4.47 10*6/uL (ref 3.70–5.45)
RDW: 13.9 % (ref 11.2–14.5)
WBC: 12.4 10*3/uL — ABNORMAL HIGH (ref 3.9–10.3)
lymph#: 1.1 10*3/uL (ref 0.9–3.3)

## 2014-10-19 LAB — COMPREHENSIVE METABOLIC PANEL (CC13)
ALT: 15 U/L (ref 0–55)
AST: 14 U/L (ref 5–34)
Albumin: 3.8 g/dL (ref 3.5–5.0)
Alkaline Phosphatase: 134 U/L (ref 40–150)
Anion Gap: 12 mEq/L — ABNORMAL HIGH (ref 3–11)
BUN: 9.8 mg/dL (ref 7.0–26.0)
CALCIUM: 8.8 mg/dL (ref 8.4–10.4)
CO2: 23 meq/L (ref 22–29)
Chloride: 105 mEq/L (ref 98–109)
Creatinine: 0.7 mg/dL (ref 0.6–1.1)
EGFR: >90 mL/min/{1.73_m2} (ref >90)
GLUCOSE: 91 mg/dL (ref 70–140)
Potassium: 3.7 mEq/L (ref 3.5–5.1)
SODIUM: 140 meq/L (ref 136–145)
TOTAL PROTEIN: 6.7 g/dL (ref 6.4–8.3)
Total Bilirubin: 0.3 mg/dL (ref 0.20–1.20)

## 2014-10-19 LAB — LACTATE DEHYDROGENASE (CC13): LDH: 143 U/L (ref 125–245)

## 2014-10-19 NOTE — Progress Notes (Signed)
Checked in new pt with no financial concerns.  Pt has my card for any billing questions or concerns. ° °

## 2014-10-19 NOTE — Progress Notes (Signed)
[place]  [phone] [phone]     ID: [name] DOB: 05-Apr-1971  MR#: [id number]  CSN#:[id number]  Patient Care Team: Tammi Sou, MD as PCP - General (Family Medicine) Chauncey Cruel, MD as Consulting Physician (Oncology) PCP: Tammi Sou, MD GYN: SU: Fanny Skates M.D. OTHER MD: Lavonna Monarch MD, Earle Gell M.D.  CHIEF COMPLAINT: Recurrent non-Hodgkin's lymphoma  CURRENT TREATMENT: Undergone evaluation   HISTORY OF PRESENT ILLNESS: [name] first noted some skin "bumps" in 1994. Sometime around a year later they seem to her to have grown some so she presented to [facility] where a right shoulder skin lesion was biopsied. This showed a B-cell cutaneous non-Hodgkin's lymphoma, with immunoglobulin heavy chain gene rearrangement showing clonal bands in 2 of 3 endonuclease lanes. She was treated with CHOP 6 completed in March 1996, with complete remission  However she had disease recurrence in 2000. She was treated briefly with chlorambucil/rituximab , then rituximab maintenance. Rituximab maintenance was continued until February 2005. The patient then was lost to follow-up.  Sometime in the spring of 2015 she noted some lesions on her back that were strongly suggestive of recurrence of the tumor. She really did not want to seek further medical care for this, but one of the lesions in particular, in the upper mid back, seems to her harder and like it is growing faster. Accordingly she presented for further evaluation and treatment.  Her subsequent history is as detailed below  INTERVAL HISTORY: [name] was evaluated in the hematology clinic 10/19/2014 accompanied by her husband Sherren Mocha.  REVIEW OF SYSTEMS: Aside from the back lesions, she has been having some drenching sweats at night. These are very intermittent. She has had no weight loss, no unexplained fatigue, no pruritus, and in short no other "B" symptoms. A detailed review  of systems today was otherwise noncontributory  PAST MEDICAL HISTORY: Past Medical History  Diagnosis Date  . HSV-2 (herpes simplex virus 2) infection   . Non-Hodgkin's lymphoma of skin     Initial dx age 63--treated with chemo.  Recurrence x 2 after the birth of each of her children--different chemo regimen used.  No recurrence since age 22 or so.  . Menorrhagia     Regular: 7 days of bleeding (2 heavy and 5 light).  . Adult ADHD   . Tobacco dependence in remission   . Anxiety and depression     PAST SURGICAL HISTORY: Past Surgical History  Procedure Laterality Date  . Tubal ligation    . Hysteroscopy  08/2007    polyp  . Endometrial ablation  06/2008  . Colonoscopy  appox age [age] yrs    Normal  . Basal cell carcinoma excision      FAMILY HISTORY Family History  Problem Relation Age of Onset  . Cancer Paternal Uncle     colon  . Alcohol abuse Maternal Grandfather   . Cancer Paternal Grandmother   . Heart disease Paternal Grandfather    the patient's parents are still living. She has 2 brothers, one half-sister. There is no history of lymphoma, leukemia, or other cancer in the family to her knowledge  GYNECOLOGIC HISTORY:  No LMP recorded. Menarche age 76, first live birth age 27, she is [place] P2. She still has regular menstrual periods.  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 Her husband Sherren Mocha is [employer] of an Insurance underwriter. Their children are Gracie, 16, and Sawyer, 62. The patient is not a  church attender    ADVANCED DIRECTIVES: Not in place   HEALTH MAINTENANCE: History  Substance Use Topics  . Smoking status: Former Research scientist (life sciences)  . Smokeless tobacco: Never Used  . Alcohol Use: Yes     Comment: social     Colonoscopy: 2006/[facility]  PAP:  Bone density:  Lipid panel:  No Known Allergies  Current Outpatient Prescriptions  Medication Sig Dispense Refill  . lisdexamfetamine (VYVANSE) 50 MG capsule Take 1 capsule (50 mg total)  by mouth daily. 30 capsule 0   No current facility-administered medications for this visit.    OBJECTIVE: Young white woman who appears well Filed Vitals:   10/19/14 1622  BP: 116/48  Pulse: 82  Temp: 98.9 F (37.2 C)  Resp: 20     Body mass index is 27.7 kg/(m^2).    ECOG FS:0 - Asymptomatic  Ocular: Sclerae unicteric, pupils equal, round and equal Ear-nose-throat: Oropharynx clear, slightly dry Lymphatic: No cervical or supraclavicular adenopathy; no axillary or inguinal adenopathy Lungs no rales or rhonchi, good excursion bilaterally Heart regular rate and rhythm, no murmur appreciated Abd soft, nontender, positive bowel sounds MSK no focal spinal tenderness, no joint edema Neuro: non-focal, well-oriented, appropriate affect Breasts: Deferred Skin: She has at least 4 areas on the skin of her back, which are erythematous, palpable, and soft. This superior 1 however is hard. These are imaged below  Photo 10/19/2014     LAB RESULTS: Results for CHARIAH, BAILEY (MRN [id number]) as of 10/19/2014 19:40  Ref. Range 10/19/2014 16:07  LDH Latest Ref Range: 125-245 U/L 143   CMP     Component Value Date/Time   NA 140 10/19/2014 1608   NA 137 01/17/2013 1111   K 3.7 10/19/2014 1608   K 4.1 01/17/2013 1111   CL 106 01/17/2013 1111   CO2 23 10/19/2014 1608   CO2 26 01/17/2013 1111   GLUCOSE 91 10/19/2014 1608   GLUCOSE 85 01/17/2013 1111   BUN 9.8 10/19/2014 1608   BUN 13 01/17/2013 1111   CREATININE 0.7 10/19/2014 1608   CREATININE 0.6 01/17/2013 1111   CALCIUM 8.8 10/19/2014 1608   CALCIUM 9.3 01/17/2013 1111   PROT 6.7 10/19/2014 1608   PROT 7.0 01/17/2013 1111   ALBUMIN 3.8 10/19/2014 1608   ALBUMIN 4.2 01/17/2013 1111   AST 14 10/19/2014 1608   AST 11 01/17/2013 1111   ALT 15 10/19/2014 1608   ALT 16 01/17/2013 1111   ALKPHOS 134 10/19/2014 1608   ALKPHOS 89 01/17/2013 1111   BILITOT 0.30 10/19/2014 1608   BILITOT 0.6 01/17/2013 1111    INo results found  for: SPEP, UPEP  Lab Results  Component Value Date   WBC 12.4* 10/19/2014   NEUTROABS 10.1* 10/19/2014   HGB 13.3 10/19/2014   HCT 40.2 10/19/2014   MCV 89.9 10/19/2014   PLT 302 10/19/2014      Chemistry      Component Value Date/Time   NA 140 10/19/2014 1608   NA 137 01/17/2013 1111   K 3.7 10/19/2014 1608   K 4.1 01/17/2013 1111   CL 106 01/17/2013 1111   CO2 23 10/19/2014 1608   CO2 26 01/17/2013 1111   BUN 9.8 10/19/2014 1608   BUN 13 01/17/2013 1111   CREATININE 0.7 10/19/2014 1608   CREATININE 0.6 01/17/2013 1111      Component Value Date/Time   CALCIUM 8.8 10/19/2014 1608   CALCIUM 9.3 01/17/2013 1111   ALKPHOS 134 10/19/2014 1608   ALKPHOS 89 01/17/2013  1111   AST 14 10/19/2014 1608   AST 11 01/17/2013 1111   ALT 15 10/19/2014 1608   ALT 16 01/17/2013 1111   BILITOT 0.30 10/19/2014 1608   BILITOT 0.6 01/17/2013 1111       No results found for: LABCA2  No components found for: [id number]  No results for input(s): INR in the last 168 hours.  Urinalysis No results found for: COLORURINE, APPEARANCEUR, LABSPEC, PHURINE, GLUCOSEU, HGBUR, BILIRUBINUR, KETONESUR, PROTEINUR, UROBILINOGEN, NITRITE, LEUKOCYTESUR  STUDIES: No results found.  ASSESSMENT: 44 y.o. [place] woman with a history of B-cell cutaneous non-Hodgkin's lymphoma initially diagnosed in 1995 through biopsy of a right shoulder lesion at [facility], with immunoglobulin heavy chain gene rearrangement studies at that time showing clonal bands in 2 of 3 internuclear ACE lanes  (1) received cyclophosphamide, vincristine, doxorubicin and prednisone (CHOP) 6 cycles completed March 1996  (2) recurrence in 2000 treated with chlorambucil, then rituximab maintenance, last dose February 2004  (3) recurrence noted clinically May 2016 with several lesions on the back, as well as night sweats, but no other "B" symptoms  PLAN: I spent approximately 60 minutes with [name] and her husband going over the  situation. Most likely she has the same B-cell cutaneous lymphoma she had 20 years ago. On the other hand one lesion, in the upper mid back, she feels has been growing a little bit faster. It is also harder than the other lesions. I think this needs to be biopsied and I am referring her to Gen. surgery for excision.  Given her night sweats, in the absence of any symptoms of menopause, we are obtaining a PET scan with chest CT to make sure we are not dealing with visceral disease.  She will then return to see me in about 3 weeks. At that time we should have the available data and I am hoping there has been no transformation in which case I would probably proceed with antibodies alone and if we get a good response, continue maintenance for an indefinite period. Of course if we do have disease outside the skin we would proceed to bone marrow biopsy as per routine non-Hodgkin's lymphoma staging.  [name] has a good understanding of the overall plan. She agrees with it. She will call with any problems that may develop before her next visit here.  Chauncey Cruel, MD   10/19/2014 7:35 PM Medical Oncology and Hematology [facility] [address] [place], [place] [address] Tel. [phone]    Fax. [phone]

## 2014-10-20 ENCOUNTER — Telehealth: Payer: Self-pay | Admitting: Oncology

## 2014-10-20 NOTE — Telephone Encounter (Signed)
s.w pt and advised on June appt....pt sched with Dr. Dalbert Batman on 6.8 @ 3:15pm

## 2014-10-21 ENCOUNTER — Encounter: Payer: Self-pay | Admitting: Family Medicine

## 2014-10-21 LAB — BETA 2 MICROGLOBULIN, SERUM: BETA 2 MICROGLOBULIN: 1.61 mg/L (ref <=2.51)

## 2014-11-02 ENCOUNTER — Encounter (HOSPITAL_COMMUNITY)
Admission: RE | Admit: 2014-11-02 | Discharge: 2014-11-02 | Disposition: A | Discharge status: Home / Self Care | Payer: BLUE CROSS/BLUE SHIELD | Source: Ambulatory Visit | Attending: Oncology | Admitting: Oncology

## 2014-11-02 ENCOUNTER — Other Ambulatory Visit: Payer: Self-pay | Admitting: General Surgery

## 2014-11-02 DIAGNOSIS — C8590 Non-Hodgkin lymphoma, unspecified, unspecified site: Secondary | ICD-10-CM | POA: Diagnosis present

## 2014-11-02 LAB — GLUCOSE, CAPILLARY: Glucose-Capillary: 99 mg/dL (ref 65–99)

## 2014-11-02 MED ORDER — FLUDEOXYGLUCOSE F - 18 (FDG) INJECTION
7.4900 | Freq: Once | INTRAVENOUS | Status: AC | PRN
  Administered 2014-11-02: 7.49 via INTRAVENOUS

## 2014-11-03 ENCOUNTER — Other Ambulatory Visit: Payer: Self-pay | Admitting: Oncology

## 2014-11-04 ENCOUNTER — Other Ambulatory Visit: Payer: Self-pay | Admitting: General Surgery

## 2014-11-13 ENCOUNTER — Ambulatory Visit: Payer: BLUE CROSS/BLUE SHIELD | Admitting: Oncology

## 2014-11-16 ENCOUNTER — Other Ambulatory Visit: Payer: Self-pay | Admitting: Oncology

## 2014-11-20 ENCOUNTER — Other Ambulatory Visit: Payer: Self-pay | Admitting: General Surgery

## 2014-11-25 ENCOUNTER — Telehealth: Payer: Self-pay | Admitting: *Deleted

## 2014-11-25 NOTE — Telephone Encounter (Signed)
"  I need to figure out how to get an appointment with Dr. Jana Hakim.  I also would like to know if Dr. Jana Hakim is willing to see my husband as a patient.  He received some abnormal lab results."  Informed her of appointment on December 01, 2014 at 11:00 am.  Also informed her of referral process and husbands PCP can request Dr. Jana Hakim as a preference.

## 2014-11-30 NOTE — Progress Notes (Signed)
[place]  [phone]) [phone] Fax:(336) [phone]     ID: [name] DOB: 1970-09-15  MR#: [id number]  XNA#:[id number]  Patient Care Team: Tammi Sou, MD as PCP - General (Family Medicine) Chauncey Cruel, MD as Consulting Physician (Oncology) PCP: Tammi Sou, MD GYN: SU: Fanny Skates M.D. OTHER MD: Lavonna Monarch MD, Earle Gell M.D.  CHIEF COMPLAINT: Recurrent non-Hodgkin's lymphoma  CURRENT TREATMENT: rituximab to start 12/11/2014   HISTORY OF PRESENT ILLNESS: [name] first noted some skin "bumps" in 1994. Sometime around a year later they seem to her to have grown some so she presented to [facility] where a right shoulder skin lesion was biopsied. This showed a B-cell cutaneous non-Hodgkin's lymphoma, with immunoglobulin heavy chain gene rearrangement showing clonal bands in 2 of 3 endonuclease lanes. She was treated with CHOP 6 completed in March 1996, with complete remission  However she had disease recurrence in 2000. She was treated briefly with chlorambucil/rituximab , then rituximab maintenance. Rituximab maintenance was continued until February 2005. The patient then was lost to follow-up.  Sometime in the spring of 2015 she noted some lesions on her back that were strongly suggestive of recurrence of the tumor. She really did not want to seek further medical care for this, but one of the lesions in particular, in the upper mid back, seems to her harder and like it is growing faster. Accordingly she presented for further evaluation and treatment.  Her subsequent history is as detailed below  INTERVAL HISTORY: Kila returns today to review results of her recent studies accompanied by her husband [name]... On 11/02/2014 she had a PET scan which showed 2 areas of skin thickening overlying the spine at approximately the T4 level measuring 1.4 cm and at the T10 level just to the right of the spinous processes, measuring 1.3 cm. SUV max for  these lesions was between 1.7 and 2.1` the other lesions noted clinically were not seen on CT or PET and there was no hypermetabolic mediastinal, hilar, or axillary adenopathy, no hypermetabolic pulmonary nodules, and no effusions. The abdomen was similarly negative.  On 09/20/2014 [name] underwent biopsy of an upper mid back lesion and this showed ([id number]) a dermal lymphoid infiltrate with many neoplastic follicles and a mixture of large and small cleaved cells. The cells were CD20 and CD79a positive. The cells expressed BCL-6 but are negative for BCL-2. Flow cytometry shows a monoclonal, kappa restricted population of B cells with no CD10 or CD5 expression. This is a primary cutaneous non-Hodgkin's follicular center B-cell lymphoma.   The bad news though is that [name] has been found to have metastatic cancer! He was having back pain and an MRI showed multiple bone lesions, suggestive of prostate cancer. He has had other scans, and is scheduled for liver biopsy. He is working with Dr. Marin Olp on this.  REVIEW OF SYSTEMS: She did fine from the biopsy except she is a little bit itchy around the scar. She has been using cortisone cream around that. She has no "B" symptoms. A detailed review of systems today was otherwise negative except for anxiety related to Todd's diagnosis.   PAST MEDICAL HISTORY: Past Medical History  Diagnosis Date  . HSV-2 (herpes simplex virus 2) infection   . Non-Hodgkin's lymphoma of skin     Initial dx age 53--treated with chemo.  Recurrence x 2 after the birth of each of her children--different chemo regimen used.  Recurrence again spring 2016-in the midst of oncology re-eval as of  09/2014  . Menorrhagia     Regular: 7 days of bleeding (2 heavy and 5 light).  . Adult ADHD   . Tobacco dependence in remission   . Anxiety and depression     PAST SURGICAL HISTORY: Past Surgical History  Procedure Laterality Date  . Tubal ligation    . Hysteroscopy  08/2007     polyp  . Endometrial ablation  06/2008  . Colonoscopy  appox age [age] yrs    Normal  . Basal cell carcinoma excision      FAMILY HISTORY Family History  Problem Relation Age of Onset  . Cancer Paternal Uncle     colon  . Alcohol abuse Maternal Grandfather   . Cancer Paternal Grandmother   . Heart disease Paternal Grandfather    the patient's parents are still living. She has 2 brothers, one half-sister. There is no history of lymphoma, leukemia, or other cancer in the family to her knowledge  GYNECOLOGIC HISTORY:  Patient's last menstrual period was 10/29/2014. Menarche age 40, first live birth age 28, she is [place] P2. She still has regular menstrual periods.  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 Their children are [name], 16, and [name], 69. The patient is not a church attender    ADVANCED DIRECTIVES: Not in place   HEALTH MAINTENANCE: History  Substance Use Topics  . Smoking status: Former Research scientist (life sciences)  . Smokeless tobacco: Never Used  . Alcohol Use: Yes     Comment: social     Colonoscopy: 2006/[facility]  PAP:  Bone density:  Lipid panel:  No Known Allergies  Current Outpatient Prescriptions  Medication Sig Dispense Refill  . lisdexamfetamine (VYVANSE) 50 MG capsule Take 1 capsule (50 mg total) by mouth daily. 30 capsule 0   No current facility-administered medications for this visit.    OBJECTIVE: Young white woman who appears well  Filed Vitals:   12/01/14 1100  BP: 121/61  Pulse: 94  Temp: 98.2 F (36.8 C)  Resp: 18     Body mass index is 27.32 kg/(m^2).    ECOG FS:0 - Asymptomatic  Sclerae unicteric, pupils round and equal Oropharynx clear and moist-- no thrush or other lesions No cervical or supraclavicular adenopathy Lungs no rales or rhonchi Heart regular rate and rhythm Abd soft, nontender, positive bowel sounds MSK no focal spinal tenderness, no upper extremity  lymphedema Neuro: nonfocal, well oriented, appropriate affect Breasts: Deferred Skin: The top middle back lesion is healing well with 8 stitches in place. The other lesions are unchanged. I have not repeated her photo.    LAB RESULTS: Results for TIFFANNI, SCARFO (MRN [id number]) as of 10/19/2014 19:40  Ref. Range 10/19/2014 16:07  LDH Latest Ref Range: 125-245 U/L 143   CMP     Component Value Date/Time   NA 140 10/19/2014 1608   NA 137 01/17/2013 1111   K 3.7 10/19/2014 1608   K 4.1 01/17/2013 1111   CL 106 01/17/2013 1111   CO2 23 10/19/2014 1608   CO2 26 01/17/2013 1111   GLUCOSE 91 10/19/2014 1608   GLUCOSE 85 01/17/2013 1111   BUN 9.8 10/19/2014 1608   BUN 13 01/17/2013 1111   CREATININE 0.7 10/19/2014 1608   CREATININE 0.6 01/17/2013 1111   CALCIUM 8.8 10/19/2014 1608   CALCIUM 9.3 01/17/2013 1111   PROT 6.7 10/19/2014 1608   PROT 7.0 01/17/2013 1111   ALBUMIN 3.8 10/19/2014 1608  ALBUMIN 4.2 01/17/2013 1111   AST 14 10/19/2014 1608   AST 11 01/17/2013 1111   ALT 15 10/19/2014 1608   ALT 16 01/17/2013 1111   ALKPHOS 134 10/19/2014 1608   ALKPHOS 89 01/17/2013 1111   BILITOT 0.30 10/19/2014 1608   BILITOT 0.6 01/17/2013 1111    INo results found for: SPEP, UPEP  Lab Results  Component Value Date   WBC 12.4* 10/19/2014   NEUTROABS 10.1* 10/19/2014   HGB 13.3 10/19/2014   HCT 40.2 10/19/2014   MCV 89.9 10/19/2014   PLT 302 10/19/2014      Chemistry      Component Value Date/Time   NA 140 10/19/2014 1608   NA 137 01/17/2013 1111   K 3.7 10/19/2014 1608   K 4.1 01/17/2013 1111   CL 106 01/17/2013 1111   CO2 23 10/19/2014 1608   CO2 26 01/17/2013 1111   BUN 9.8 10/19/2014 1608   BUN 13 01/17/2013 1111   CREATININE 0.7 10/19/2014 1608   CREATININE 0.6 01/17/2013 1111      Component Value Date/Time   CALCIUM 8.8 10/19/2014 1608   CALCIUM 9.3 01/17/2013 1111   ALKPHOS 134 10/19/2014 1608   ALKPHOS 89 01/17/2013 1111   AST 14 10/19/2014 1608    AST 11 01/17/2013 1111   ALT 15 10/19/2014 1608   ALT 16 01/17/2013 1111   BILITOT 0.30 10/19/2014 1608   BILITOT 0.6 01/17/2013 1111       No results found for: LABCA2  No components found for: LABCA125  No results for input(s): INR in the last 168 hours.  Urinalysis No results found for: COLORURINE, APPEARANCEUR, LABSPEC, PHURINE, GLUCOSEU, HGBUR, BILIRUBINUR, KETONESUR, PROTEINUR, UROBILINOGEN, NITRITE, LEUKOCYTESUR  STUDIES: Nm Pet Image Initial (pi) Skull Base To Thigh  11/02/2014   CLINICAL DATA:  Initial treatment strategy for non Hodgkin lymphoma.  EXAM: NUCLEAR MEDICINE PET SKULL BASE TO THIGH  TECHNIQUE: 7.5 mCi F-18 FDG was injected intravenously. Full-ring PET imaging was performed from the skull base to thigh after the radiotracer. CT data was obtained and used for attenuation correction and anatomic localization.  FASTING BLOOD GLUCOSE:  Value: 99 mg/dl  COMPARISON:  None.  FINDINGS: NECK  No hypermetabolic lymph nodes in the neck. CT images show no acute findings.  CHEST  There are 2 focal areas of skin thickening overlying the spine at approximately T4 level, measuring 1.4 cm (series 4, image 48) with an SUV max of 1.7, and at approximately the T10-11 level, just to the right of the spinous process, measuring approximately 1.3 cm (series 4, image 85), with a corresponding SUV max of 2.1. Additional skin lesions seen on the patient's back on physical exam, per office visit clinic note on 10/19/2014, are not readily visualized on CT or PET.  No hypermetabolic mediastinal, hilar or axillary lymph nodes. No hypermetabolic pulmonary nodules. CT images show no pericardial or pleural effusion. A few scattered tiny pulmonary nodules measure 3 mm or less in size and are nonspecific as well as too small for PET resolution.  ABDOMEN/PELVIS  No abnormal hypermetabolism in the liver, adrenal glands, spleen or pancreas. No hypermetabolic lymph nodes. CT images show the liver, gallbladder,  adrenal glands, kidneys, spleen, pancreas, stomach and bowel to be grossly unremarkable. Uterus and ovaries are visualized. Tubal ligation clips are in place.  SKELETON  No abnormal osseous hypermetabolism. Small sclerotic lesion in the right iliac wing is likely a bone island.  IMPRESSION: Minimal FDG avidity within 2 skin lesions overlying  the thoracic spine. No evidence of nodal or visceral involvement.   Electronically Signed   By: Lorin Picket M.D.   On: 11/02/2014 14:28    ASSESSMENT: 44 y.o. [place] woman with a history of B-cell cutaneous non-Hodgkin's lymphoma initially diagnosed in 1995 through biopsy of a right shoulder lesion at [facility], with immunoglobulin heavy chain gene rearrangement studies at that time showing clonal bands in 2 of 3 internuclear ACE lanes  (1) received cyclophosphamide, vincristine, doxorubicin and prednisone (CHOP) 6 cycles completed March 1996  (2) recurrence in 2000 treated with chlorambucil, then rituximab maintenance, last dose February 2004  (3) recurrence noted clinically May 2016 with several lesions on the back, as well as night sweats, but no other "B" symptoms  (a) PET scanning in 11/01/2024 shows 2 skin lesions overlying the thoracic spine but no nodal or visceral involvement  (b) biopsy of an upper mid back skin lesion 11/20/2014 shows primary cutaneous follicle center B-cell non-Hodgkin's lymphoma  (4) rituximab to start 12/11/2014 or at dr Dicie Beam discretion  (a) hepatitis serologies to be repeated  PLAN: I reviewed crystals lesions with Dr. Arloa Koh since local radiation can be very effective in controlling this disease. He feels the lesions are sufficiently diffuse that systemic therapy is a better choice, and I agree.  Kayleanna and I then discussed rituximab. She had this in the past, and it was very effective. She did develop hives with each treatment, and she likely will require premedication with Benadryl plus or minus Solu-Medrol.  She also will need to have her hepatitis serologies rechecked assay has been several years since she has had these treatments.  After discussing all that however she expressed an interest in being treated in [facility]. Her husband is under the care of Dr. Marin Olp there, and she anticipates going there frequently. It certainly makes sense to me that both of them could be treated in the same office, and also the [facility] office is closer to them.  Accordingly I have asked Dr. Marin Olp to arrange to see her and take over a treatment. I have made a return appointment here for Montina in 2 or 3 months but that is optional and she does not need to keep that appointment.  Today I did write her for lorazepam at her request because of the anxiety issues. She has been on Wellbutrin in the past but found that that made her irritable. She understands concerns regarding tolerance with benzodiazepines. I also gave her a copy of the living well and healthcare power of attorney documents for them to start completing and notarizing them, at their discretion. I also gave them a copy of the [facility] pamphlet and suggested they make use of this service as they discuss their diagnoses with her children.  Chauncey Cruel, MD   12/01/2014 11:35 AM Medical Oncology and Hematology [facility] [address] [place], [place] [address] Tel. [phone]    Fax. [phone]

## 2014-12-01 ENCOUNTER — Telehealth: Payer: Self-pay | Admitting: Oncology

## 2014-12-01 ENCOUNTER — Ambulatory Visit (HOSPITAL_BASED_OUTPATIENT_CLINIC_OR_DEPARTMENT_OTHER): Payer: BLUE CROSS/BLUE SHIELD | Admitting: Oncology

## 2014-12-01 VITALS — BP 121/61 | HR 94 | Temp 98.2°F | Resp 18 | Ht 62.0 in | Wt 149.4 lb

## 2014-12-01 DIAGNOSIS — C8590 Non-Hodgkin lymphoma, unspecified, unspecified site: Secondary | ICD-10-CM

## 2014-12-01 DIAGNOSIS — C8519 Unspecified B-cell lymphoma, extranodal and solid organ sites: Secondary | ICD-10-CM

## 2014-12-01 MED ORDER — LORAZEPAM 0.5 MG PO TABS: 0.5000 mg | ORAL_TABLET | Freq: Three times a day (TID) | ORAL | Status: DC

## 2014-12-01 NOTE — Telephone Encounter (Signed)
Appointments made and avs pritned  anne

## 2014-12-02 ENCOUNTER — Encounter: Payer: Self-pay | Admitting: Family Medicine

## 2014-12-08 ENCOUNTER — Other Ambulatory Visit: Payer: Self-pay | Admitting: Hematology & Oncology

## 2014-12-08 DIAGNOSIS — C8519 Unspecified B-cell lymphoma, extranodal and solid organ sites: Secondary | ICD-10-CM

## 2014-12-09 ENCOUNTER — Ambulatory Visit (HOSPITAL_BASED_OUTPATIENT_CLINIC_OR_DEPARTMENT_OTHER): Payer: BLUE CROSS/BLUE SHIELD | Admitting: Hematology & Oncology

## 2014-12-09 ENCOUNTER — Encounter: Payer: Self-pay | Admitting: Hematology & Oncology

## 2014-12-09 VITALS — BP 102/54 | HR 87 | Temp 98.0°F | Resp 14 | Ht 62.0 in | Wt 147.0 lb

## 2014-12-09 DIAGNOSIS — C8519 Unspecified B-cell lymphoma, extranodal and solid organ sites: Secondary | ICD-10-CM

## 2014-12-09 DIAGNOSIS — C8290 Follicular lymphoma, unspecified, unspecified site: Secondary | ICD-10-CM

## 2014-12-10 ENCOUNTER — Telehealth: Payer: Self-pay | Admitting: Hematology & Oncology

## 2014-12-10 NOTE — Progress Notes (Signed)
Hematology and Oncology Follow Up Visit  [name] [id number] Oct 13, 1970 44 y.o. 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 I saw her husband at the same time.  She has recurrent cutaneous follicular non-Hodgkin lymphoma. This apparently is her third recurrence. She initially presented back close to 20 years ago.  The last recurrence was probably 7-8 years ago.  She recently noted a growing  Subcutaneous nodule on her back. This is typically howshe presents with recurrence.  She was seen by Dr. Dalbert Batman. He's done past surgery on her. He went ahead and did an excision. The  Pathology report ([id number]) showed follicular mixed non-Hodgkin's lymphoma.  In the past, she has been treated with serial agent Rituxan.  She feels pretty well. She's had no fever. She's had no nausea or vomiting. Patient did have a PET scan done. The PET scan just showed activity  Where the recurrence was. There is no obvious metabolic lymph nodes. The bones looked okay.   She is having no pain. She's having no bleeding. She is having no leg swelling. There's been no rashes.  Overall, her performance status is ECOG 0.  Medications:  Current outpatient prescriptions:  .  lisdexamfetamine (VYVANSE) 50 MG capsule, Take 1 capsule (50 mg total) by mouth daily., Disp: 30 capsule, Rfl: 0 .  LORazepam (ATIVAN) 0.5 MG tablet, Take 1 tablet (0.5 mg total) by mouth every 8 (eight) hours., Disp: 30 tablet, Rfl: 0  Allergies: No Known Allergies  Past Medical History, Surgical history, Social history, and Family History were reviewed and updated.  Review of Systems: aas above  Physical Exam:  height is 5\' 2"  (1.575 m) and weight is 147 lb (66.679 kg). Her oral temperature is 98 F (36.7 C). Her blood pressure is 102/54  and her pulse is 87. Her respiration is 14.   Wt Readings from Last 3 Encounters:  12/09/14 147 lb (66.679 kg)  12/01/14 149 lb 6.4 oz (67.767 kg)  10/19/14 151 lb 8 oz (68.72 kg)     Well-developed well-nourished white female in no obvious distress. Head and neck exam shows no ocular or oral lesion.  She has no adenopathy in the neck. She has no scleral icterus. Lungs are clear. Cardiac exam regular rate and rhythm with no murmurs, rubs or bruits. Axillary exam shows possible left axillary lymph node area did this might be a centimeter in size and mobile and non-tender. There is no right axillary adenopathy. Abdomen is soft. She has good bowel sounds. There is no fluid wave. There is no palpable liver or spleen tip. Back exam shows the incisional scar in the mid//upperthoracic spine. This is healing. Extremities shows no clubbing, cyanosis or edema.sskin exam shows some hyperpigmented lesions. I see nothing suspicious.  Neurological exam is nonfocal.  Lab Results  Component Value Date   WBC 12.4* 10/19/2014   HGB 13.3 10/19/2014   HCT 40.2 10/19/2014   MCV 89.9 10/19/2014   PLT 302 10/19/2014     Chemistry      Component Value Date/Time   NA 140 10/19/2014 1608   NA 137 01/17/2013 1111   K 3.7 10/19/2014 1608   K 4.1 01/17/2013 1111   CL 106 01/17/2013 1111   CO2 23 10/19/2014 1608   CO2 26 01/17/2013 1111   BUN  9.8 10/19/2014 1608   BUN 13 01/17/2013 1111   CREATININE 0.7 10/19/2014 1608   CREATININE 0.6 01/17/2013 1111      Component Value Date/Time   CALCIUM 8.8 10/19/2014 1608   CALCIUM 9.3 01/17/2013 1111   ALKPHOS 134 10/19/2014 1608   ALKPHOS 89 01/17/2013 1111   AST 14 10/19/2014 1608   AST 11 01/17/2013 1111   ALT 15 10/19/2014 1608   ALT 16 01/17/2013 1111   BILITOT 0.30 10/19/2014 1608   BILITOT 0.6 01/17/2013 1111         Impression and Plan: [name] is 44 year old white female. This is her third recurrence of cutaneous lymphoma. I think she is  clearly following the progression of disease with recurrence of follicular lymphoma. She now appears to have a more mixed histology.. I think this is an indicator of her disease becoming more aggressive.  I believe that she really would benefit from systemic chemotherapy.  She would be a great candidate for Gazyva/Treanda. The recently published GADOLIN trial clearly showed a survival benefit for Gazyva/Treanda in patients who've had recurrence despite past week Rituxan therapy. I spoke to her for about an hour. Again she comes in with her husband. It is incredibly distressing that her husband has metastatic adenocarcinoma of unknown primary site. She definitely will do much better than he will do.   I explained to her the nature of therapy. I don't believe she will lose her hair. I believe this will be very tolerable. I response rate will be 90%.Again, we don't have any the obvious that we can follow. However, I would still consider PET scans on her.   I'm not sure that she would need any type of maintenance therapy.   I would think that 6 cycles of treatment would be adequate for her.  I went over the side effects of low blood counts, fatigue, nausea, rashes, diarrhea/constipation. She understands all this. She agrees to proceed.  We will start next week.  I'll see her back the date of her second cycle of treatment.  Volanda Napoleon, MD 7/14/20167:35 AM

## 2014-12-10 NOTE — Telephone Encounter (Signed)
CONTACTED PT REGARDING APPT ON 7/20. PT CONFIRMED APPT

## 2014-12-14 ENCOUNTER — Telehealth: Payer: Self-pay | Admitting: Hematology & Oncology

## 2014-12-14 NOTE — Telephone Encounter (Signed)
[facility] [place]  [place]

## 2014-12-16 ENCOUNTER — Telehealth: Payer: Self-pay | Admitting: Nurse Practitioner

## 2014-12-16 ENCOUNTER — Other Ambulatory Visit: Payer: Self-pay | Admitting: Nurse Practitioner

## 2014-12-16 ENCOUNTER — Ambulatory Visit (HOSPITAL_BASED_OUTPATIENT_CLINIC_OR_DEPARTMENT_OTHER): Payer: BLUE CROSS/BLUE SHIELD

## 2014-12-16 ENCOUNTER — Other Ambulatory Visit: Payer: BLUE CROSS/BLUE SHIELD

## 2014-12-16 ENCOUNTER — Other Ambulatory Visit: Payer: Self-pay

## 2014-12-16 VITALS — BP 100/50 | HR 83 | Temp 98.1°F | Resp 18

## 2014-12-16 DIAGNOSIS — C8519 Unspecified B-cell lymphoma, extranodal and solid organ sites: Secondary | ICD-10-CM

## 2014-12-16 DIAGNOSIS — Z5112 Encounter for antineoplastic immunotherapy: Secondary | ICD-10-CM

## 2014-12-16 DIAGNOSIS — C8290 Follicular lymphoma, unspecified, unspecified site: Secondary | ICD-10-CM

## 2014-12-16 DIAGNOSIS — Z5111 Encounter for antineoplastic chemotherapy: Secondary | ICD-10-CM

## 2014-12-16 LAB — CBC WITH DIFFERENTIAL (CANCER CENTER ONLY)
BASO#: 0 10*3/uL (ref 0.0–0.2)
BASO%: 0.1 % (ref 0.0–2.0)
EOS%: 2.6 % (ref 0.0–7.0)
Eosinophils Absolute: 0.2 10*3/uL (ref 0.0–0.5)
HEMATOCRIT: 37.2 % (ref 34.8–46.6)
HEMOGLOBIN: 12.3 g/dL (ref 11.6–15.9)
LYMPH#: 1.7 10*3/uL (ref 0.9–3.3)
LYMPH%: 22.8 % (ref 14.0–48.0)
MCH: 30.7 pg (ref 26.0–34.0)
MCHC: 33.1 g/dL (ref 32.0–36.0)
MCV: 93 fL (ref 81–101)
MONO#: 0.4 10*3/uL (ref 0.1–0.9)
MONO%: 5.1 % (ref 0.0–13.0)
NEUT#: 5.2 10*3/uL (ref 1.5–6.5)
NEUT%: 69.4 % (ref 39.6–80.0)
Platelets: 258 10*3/uL (ref 145–400)
RBC: 4.01 10*6/uL (ref 3.70–5.32)
RDW: 13.9 % (ref 11.1–15.7)
WBC: 7.4 10*3/uL (ref 3.9–10.0)

## 2014-12-16 LAB — CMP (CANCER CENTER ONLY)
ALK PHOS: 97 U/L — AB (ref 26–84)
ALT: 14 U/L (ref 10–47)
AST: 16 U/L (ref 11–38)
Albumin: 3.4 g/dL (ref 3.3–5.5)
BUN, Bld: 10 mg/dL (ref 7–22)
CHLORIDE: 108 meq/L (ref 98–108)
CO2: 23 meq/L (ref 18–33)
CREATININE: 0.7 mg/dL (ref 0.6–1.2)
Calcium: 9.1 mg/dL (ref 8.0–10.3)
Glucose, Bld: 125 mg/dL — ABNORMAL HIGH (ref 73–118)
Potassium: 3.1 mEq/L — ABNORMAL LOW (ref 3.3–4.7)
SODIUM: 142 meq/L (ref 128–145)
Total Bilirubin: 0.7 mg/dl (ref 0.20–1.60)
Total Protein: 6.2 g/dL — ABNORMAL LOW (ref 6.4–8.1)

## 2014-12-16 LAB — HEPATITIS PANEL, ACUTE
HCV AB: NEGATIVE
HEP A IGM: NONREACTIVE
Hep B C IgM: NONREACTIVE
Hepatitis B Surface Ag: NEGATIVE

## 2014-12-16 MED ORDER — SODIUM CHLORIDE 0.9 % IV SOLN
100.0000 mg | Freq: Once | INTRAVENOUS | Status: AC
  Administered 2014-12-16: 100 mg via INTRAVENOUS
  Filled 2014-12-16: qty 4

## 2014-12-16 MED ORDER — ONDANSETRON HCL 8 MG PO TABS: 8.0000 mg | ORAL_TABLET | Freq: Two times a day (BID) | ORAL | Status: DC

## 2014-12-16 MED ORDER — PROCHLORPERAZINE MALEATE 10 MG PO TABS: 10.0000 mg | ORAL_TABLET | Freq: Four times a day (QID) | ORAL | Status: DC | PRN

## 2014-12-16 MED ORDER — DEXAMETHASONE 4 MG PO TABS: 8.0000 mg | ORAL_TABLET | Freq: Two times a day (BID) | ORAL | Status: DC

## 2014-12-16 MED ORDER — ACETAMINOPHEN 325 MG PO TABS
650.0000 mg | ORAL_TABLET | Freq: Once | ORAL | Status: AC
  Administered 2014-12-16: 650 mg via ORAL

## 2014-12-16 MED ORDER — SODIUM CHLORIDE 0.9 % IV SOLN
Freq: Once | INTRAVENOUS | Status: AC
  Administered 2014-12-16: 10:00:00 via INTRAVENOUS
  Filled 2014-12-16: qty 4

## 2014-12-16 MED ORDER — DIPHENHYDRAMINE HCL 25 MG PO CAPS
ORAL_CAPSULE | ORAL | Status: AC
  Filled 2014-12-16: qty 2

## 2014-12-16 MED ORDER — SODIUM CHLORIDE 0.9 % IV SOLN
Freq: Once | INTRAVENOUS | Status: AC
  Administered 2014-12-16: 10:00:00 via INTRAVENOUS

## 2014-12-16 MED ORDER — DIPHENHYDRAMINE HCL 25 MG PO CAPS
50.0000 mg | ORAL_CAPSULE | Freq: Once | ORAL | Status: AC
  Administered 2014-12-16: 50 mg via ORAL

## 2014-12-16 MED ORDER — SODIUM CHLORIDE 0.9 % IV SOLN
90.0000 mg/m2 | Freq: Once | INTRAVENOUS | Status: AC
  Administered 2014-12-16: 150 mg via INTRAVENOUS
  Filled 2014-12-16: qty 6

## 2014-12-16 MED ORDER — ACETAMINOPHEN 325 MG PO TABS
ORAL_TABLET | ORAL | Status: AC
  Filled 2014-12-16: qty 2

## 2014-12-16 MED ORDER — ACYCLOVIR 400 MG PO TABS: 400.0000 mg | ORAL_TABLET | Freq: Every day | ORAL | Status: DC

## 2014-12-16 NOTE — Telephone Encounter (Signed)
Completed forms given to patient and copy also filed.

## 2014-12-16 NOTE — Patient Instructions (Addendum)
[facility] Discharge Instructions for Patients Receiving Chemotherapy  Today you received the following chemotherapy agents Bendamustine and gazyva  To help prevent nausea and vomiting after your treatment, we encourage you to take your nausea medication   1) Zofran (Ondansetron) 8 mg twice a day. Start the day after chemo for 2 days. Then take as needed for nausea or vomiting.   2) Decadron (Dexamethasone) Take 8 mg one time daily for 3 days beginning morning after chemotherapy. take with food   BELOW ARE SYMPTOMS THAT SHOULD BE REPORTED IMMEDIATELY:  *FEVER GREATER THAN 100.5 F  *CHILLS WITH OR WITHOUT FEVER  NAUSEA AND VOMITING THAT IS NOT CONTROLLED WITH YOUR NAUSEA MEDICATION  *UNUSUAL SHORTNESS OF BREATH  *UNUSUAL BRUISING OR BLEEDING  TENDERNESS IN MOUTH AND THROAT WITH OR WITHOUT PRESENCE OF ULCERS  *URINARY PROBLEMS  *BOWEL PROBLEMS  UNUSUAL RASH Items with * indicate a potential emergency and should be followed up as soon as possible.  One of the nurses will contact you 24 hours after your treatment. Please let the nurse know about any problems that you may have experienced. Feel free to call the clinic [phone]   I have been informed and understand all the instructions given to me. I know to contact the clinic, my physician, or go to the Emergency Department if any problems should occur. I do not have any questions at this time, but understand that I may call the clinic during office hours   should I have any questions or need assistance in obtaining follow up care.    __________________________________________  _____________  __________ Signature of Patient or Authorized Representative            Date                   Time    __________________________________________ Nurse's Signature   Bendamustine Injection What is this medicine? BENDAMUSTINE (BEN da MUS teen) is a chemotherapy drug. It is used to treat chronic lymphocytic leukemia  and non-Hodgkin lymphoma. This medicine may be used for other purposes; ask your health care provider or pharmacist if you have questions. COMMON BRAND NAME(S): Treanda What should I tell my health care provider before I take this medicine? They need to know if you have any of these conditions: -kidney disease -liver disease -an unusual or allergic reaction to bendamustine, mannitol, other medicines, foods, dyes, or preservatives -pregnant or trying to get pregnant -breast-feeding How should I use this medicine? This medicine is for infusion into a vein. It is given by a health care professional in a hospital or clinic setting. Talk to your pediatrician regarding the use of this medicine in children. Special care may be needed. Overdosage: If you think you have taken too much of this medicine contact a poison control center or emergency room at once. NOTE: This medicine is only for you. Do not share this medicine with others. What if I miss a dose? It is important not to miss your dose. Call your doctor or health care professional if you are unable to keep an appointment. What may interact with this medicine? Do not take this medicine with any of the following medications: -clozapine This medicine may also interact with the following medications: -atazanavir -cimetidine -ciprofloxacin -enoxacin -fluvoxamine -medicines for seizures like carbamazepine and phenobarbital -mexiletine -rifampin -tacrine -thiabendazole -zileuton This list may not describe all possible interactions. Give your health care provider a list of all the medicines, herbs, non-prescription drugs, or dietary supplements you  use. Also tell them if you smoke, drink alcohol, or use illegal drugs. Some items may interact with your medicine. What should I watch for while using this medicine? Your condition will be monitored carefully while you are receiving this medicine. This drug may make you feel generally unwell.  This is not uncommon, as chemotherapy can affect healthy cells as well as cancer cells. Report any side effects. Continue your course of treatment even though you feel ill unless your doctor tells you to stop. Call your doctor or health care professional for advice if you get a fever, chills or sore throat, or other symptoms of a cold or flu. Do not treat yourself. This drug decreases your body's ability to fight infections. Try to avoid being around people who are sick. This medicine may increase your risk to bruise or bleed. Call your doctor or health care professional if you notice any unusual bleeding. Be careful brushing and flossing your teeth or using a toothpick because you may get an infection or bleed more easily. If you have any dental work done, tell your dentist you are receiving this medicine. Avoid taking products that contain aspirin, acetaminophen, ibuprofen, naproxen, or ketoprofen unless instructed by your doctor. These medicines may hide a fever. Do not become pregnant while taking this medicine. Women should inform their doctor if they wish to become pregnant or think they might be pregnant. There is a potential for serious side effects to an unborn child. Men should inform their doctors if they wish to father a child. This medicine may lower sperm counts. Talk to your health care professional or pharmacist for more information. Do not breast-feed an infant while taking this medicine. What side effects may I notice from receiving this medicine? Side effects that you should report to your doctor or health care professional as soon as possible: -allergic reactions like skin rash, itching or hives, swelling of the face, lips, or tongue -low blood counts - this medicine may decrease the number of white blood cells, red blood cells and platelets. You may be at increased risk for infections and bleeding. -signs of infection - fever or chills, cough, sore throat, pain or difficulty passing  urine -signs of decreased platelets or bleeding - bruising, pinpoint red spots on the skin, black, tarry stools, blood in the urine -signs of decreased red blood cells - unusually weak or tired, fainting spells, lightheadedness -trouble passing urine or change in the amount of urine Side effects that usually do not require medical attention (report to your doctor or health care professional if they continue or are bothersome): -diarrhea This list may not describe all possible side effects. Call your doctor for medical advice about side effects. You may report side effects to FDA at 1-800-FDA-1088. Where should I keep my medicine? This drug is given in a hospital or clinic and will not be stored at home. NOTE: This sheet is a summary. It may not cover all possible information. If you have questions about this medicine, talk to your doctor, pharmacist, or health care provider.  2015, Elsevier/Gold Standard. (2011-08-11 14:15:47)  Obinutuzumab injection What is this medicine? OBINUTUZUMAB (OH bi nue TOOZ ue mab) is a monoclonal antibody. It is used to treat chronic lymphocytic leukemia. This drug targets a specific protein on cancer cells and stops them from growing. This medicine may be used for other purposes; ask your health care provider or pharmacist if you have questions. COMMON BRAND NAME(S): GAZYVA What should I tell my health care  provider before I take this medicine? They need to know if you have any of these conditions: -hepatitis -low blood counts, like low white cell, platelet, or red cell counts -lung or breathing disease -heart disease -an unusual or allergic reaction to ofatumumab, other medicines, foods, dyes, or preservatives -pregnant or trying to get pregnant -breast-feeding How should I use this medicine? This medicine is for infusion into a vein. It is given by a health care professional in a hospital or clinic setting. Talk to your pediatrician regarding the use of  this medicine in children. Special care may be needed. Overdosage: If you think you've taken too much of this medicine contact a poison control center or emergency room at once. Overdosage: If you think you have taken too much of this medicine contact a poison control center or emergency room at once. NOTE: This medicine is only for you. Do not share this medicine with others. What if I miss a dose? Keep appointments for follow-up doses as directed. It is important not to miss your dose. Call your doctor or health care professional if you are unable to keep an appointment. What may interact with this medicine? -live virus vaccines This list may not describe all possible interactions. Give your health care provider a list of all the medicines, herbs, non-prescription drugs, or dietary supplements you use. Also tell them if you smoke, drink alcohol, or use illegal drugs. Some items may interact with your medicine. What should I watch for while using this medicine? Report any side effects that you notice during your treatment right away, such as changes in your breathing, fever, chills, dizziness or lightheadedness. These effects are more common with the first dose. Visit your prescriber or health care professional for checks on your progress. You will need to have regular blood work. Report any other side effects. The side effects of this medicine can continue after you finish your treatment. Continue your course of treatment even though you feel ill unless your doctor tells you to stop. Call your doctor or health care professional for advice if you get a fever, chills or sore throat, or other symptoms of a cold or flu. Do not treat yourself. This drug decreases your body's ability to fight infections. Try to avoid being around people who are sick. This medicine may increase your risk to bruise or bleed. Call your doctor or health care professional if you notice any unusual bleeding. Be careful brushing  and flossing your teeth or using a toothpick because you may get an infection or bleed more easily. If you have any dental work done, tell your dentist you are receiving this medicine. Avoid taking products that contain aspirin, acetaminophen, ibuprofen, naproxen, or ketoprofen unless instructed by your doctor. These medicines may hide a fever. Do not become pregnant while taking this medicine. Women should inform their doctor if they wish to become pregnant or think they might be pregnant. There is a potential for serious side effects to an unborn child. Talk to your health care professional or pharmacist for more information. Do not breast-feed an infant while taking this medicine. What side effects may I notice from receiving this medicine? Side effects that you should report to your doctor or health care professional as soon as possible: -allergic reactions like skin rash, itching or hives, swelling of the face, lips, or tongue -breathing problems -changes in vision -chest pain or chest tightness -chills -confusion, trouble speaking or understanding -cough -diarrhea -dizziness -fainting spells -fever -general ill feeling  or flu-like symptoms -lightheadedness -loss of balance or coordination -nausea, vomiting -right upper belly pain -trouble walking -unusual bleeding or bruising -unusually weak or tired -yellowing of the eyes or skin Side effects that usually do not require medical attention (Report these to your doctor or health care professional if they continue or are bothersome.): -muscle pain -muscle cramps This list may not describe all possible side effects. Call your doctor for medical advice about side effects. You may report side effects to FDA at 1-800-FDA-1088. Where should I keep my medicine? This drug is only given in a hospital or clinic and will not be stored at home. NOTE: This sheet is a summary. It may not cover all possible information. If you have questions  about this medicine, talk to your doctor, pharmacist, or health care provider.  2015, Elsevier/Gold Standard. (2012-04-04 18:24:29)

## 2014-12-17 ENCOUNTER — Encounter: Payer: Self-pay | Admitting: Pharmacist

## 2014-12-17 ENCOUNTER — Ambulatory Visit (HOSPITAL_BASED_OUTPATIENT_CLINIC_OR_DEPARTMENT_OTHER): Payer: BLUE CROSS/BLUE SHIELD

## 2014-12-17 VITALS — BP 101/46 | HR 72 | Temp 98.1°F | Resp 20

## 2014-12-17 DIAGNOSIS — C8290 Follicular lymphoma, unspecified, unspecified site: Secondary | ICD-10-CM | POA: Diagnosis not present

## 2014-12-17 DIAGNOSIS — Z5112 Encounter for antineoplastic immunotherapy: Secondary | ICD-10-CM | POA: Diagnosis not present

## 2014-12-17 DIAGNOSIS — Z5111 Encounter for antineoplastic chemotherapy: Secondary | ICD-10-CM

## 2014-12-17 DIAGNOSIS — C8519 Unspecified B-cell lymphoma, extranodal and solid organ sites: Secondary | ICD-10-CM

## 2014-12-17 MED ORDER — SODIUM CHLORIDE 0.9 % IV SOLN
Freq: Once | INTRAVENOUS | Status: AC
  Administered 2014-12-17: 12:00:00 via INTRAVENOUS
  Filled 2014-12-17: qty 4

## 2014-12-17 MED ORDER — SODIUM CHLORIDE 0.9 % IV SOLN
Freq: Once | INTRAVENOUS | Status: AC
  Administered 2014-12-17: 12:00:00 via INTRAVENOUS

## 2014-12-17 MED ORDER — ACETAMINOPHEN 325 MG PO TABS
650.0000 mg | ORAL_TABLET | Freq: Once | ORAL | Status: AC
  Administered 2014-12-17: 650 mg via ORAL

## 2014-12-17 MED ORDER — DIPHENHYDRAMINE HCL 25 MG PO CAPS
50.0000 mg | ORAL_CAPSULE | Freq: Once | ORAL | Status: AC
  Administered 2014-12-17: 50 mg via ORAL

## 2014-12-17 MED ORDER — SODIUM CHLORIDE 0.9 % IV SOLN
90.0000 mg/m2 | Freq: Once | INTRAVENOUS | Status: AC
  Administered 2014-12-17: 150 mg via INTRAVENOUS
  Filled 2014-12-17: qty 6

## 2014-12-17 MED ORDER — SODIUM CHLORIDE 0.9 % IV SOLN
900.0000 mg | Freq: Once | INTRAVENOUS | Status: AC
  Administered 2014-12-17: 900 mg via INTRAVENOUS
  Filled 2014-12-17: qty 36

## 2014-12-17 MED ORDER — DIPHENHYDRAMINE HCL 50 MG/ML IJ SOLN: 50.0000 mg | Freq: Once | INTRAMUSCULAR | Status: DC

## 2014-12-17 NOTE — Patient Instructions (Signed)
[facility] Discharge Instructions for Patients Receiving Chemotherapy  Today you received the following chemotherapy agents Bendamustine and Gazyva.  To help prevent nausea and vomiting after your treatment, we encourage you to take your nausea medication.   If you develop nausea and vomiting that is not controlled by your nausea medication, call the clinic.   BELOW ARE SYMPTOMS THAT SHOULD BE REPORTED IMMEDIATELY:  *FEVER GREATER THAN 100.5 F  *CHILLS WITH OR WITHOUT FEVER  NAUSEA AND VOMITING THAT IS NOT CONTROLLED WITH YOUR NAUSEA MEDICATION  *UNUSUAL SHORTNESS OF BREATH  *UNUSUAL BRUISING OR BLEEDING  TENDERNESS IN MOUTH AND THROAT WITH OR WITHOUT PRESENCE OF ULCERS  *URINARY PROBLEMS  *BOWEL PROBLEMS  UNUSUAL RASH Items with * indicate a potential emergency and should be followed up as soon as possible.  Feel free to call the clinic you have any questions or concerns. The clinic phone number is [phone].  Please show the [facility] at check-in to the Emergency Department and triage nurse.

## 2014-12-23 ENCOUNTER — Other Ambulatory Visit: Payer: Self-pay | Admitting: Emergency Medicine

## 2014-12-23 ENCOUNTER — Other Ambulatory Visit (HOSPITAL_BASED_OUTPATIENT_CLINIC_OR_DEPARTMENT_OTHER): Payer: BLUE CROSS/BLUE SHIELD

## 2014-12-23 ENCOUNTER — Ambulatory Visit (HOSPITAL_BASED_OUTPATIENT_CLINIC_OR_DEPARTMENT_OTHER): Payer: BLUE CROSS/BLUE SHIELD

## 2014-12-23 VITALS — BP 102/67 | HR 54 | Temp 98.4°F | Resp 18

## 2014-12-23 DIAGNOSIS — C8519 Unspecified B-cell lymphoma, extranodal and solid organ sites: Secondary | ICD-10-CM

## 2014-12-23 DIAGNOSIS — C8290 Follicular lymphoma, unspecified, unspecified site: Secondary | ICD-10-CM | POA: Diagnosis not present

## 2014-12-23 DIAGNOSIS — Z5112 Encounter for antineoplastic immunotherapy: Secondary | ICD-10-CM | POA: Diagnosis not present

## 2014-12-23 LAB — CBC WITH DIFFERENTIAL (CANCER CENTER ONLY)
BASO#: 0 10*3/uL (ref 0.0–0.2)
BASO%: 0.1 % (ref 0.0–2.0)
EOS%: 0.1 % (ref 0.0–7.0)
Eosinophils Absolute: 0 10*3/uL (ref 0.0–0.5)
HEMATOCRIT: 37.1 % (ref 34.8–46.6)
HEMOGLOBIN: 12.7 g/dL (ref 11.6–15.9)
LYMPH#: 0.2 10*3/uL — ABNORMAL LOW (ref 0.9–3.3)
LYMPH%: 1.5 % — AB (ref 14.0–48.0)
MCH: 31.1 pg (ref 26.0–34.0)
MCHC: 34.2 g/dL (ref 32.0–36.0)
MCV: 91 fL (ref 81–101)
MONO#: 1.4 10*3/uL — ABNORMAL HIGH (ref 0.1–0.9)
MONO%: 9.8 % (ref 0.0–13.0)
NEUT%: 88.5 % — ABNORMAL HIGH (ref 39.6–80.0)
NEUTROS ABS: 12.7 10*3/uL — AB (ref 1.5–6.5)
PLATELETS: 360 10*3/uL (ref 145–400)
RBC: 4.08 10*6/uL (ref 3.70–5.32)
RDW: 13.9 % (ref 11.1–15.7)
WBC: 14.3 10*3/uL — ABNORMAL HIGH (ref 3.9–10.0)

## 2014-12-23 LAB — CMP (CANCER CENTER ONLY)
ALBUMIN: 3.6 g/dL (ref 3.3–5.5)
ALT(SGPT): 25 U/L (ref 10–47)
AST: 22 U/L (ref 11–38)
Alkaline Phosphatase: 74 U/L (ref 26–84)
BUN, Bld: 20 mg/dL (ref 7–22)
CALCIUM: 9 mg/dL (ref 8.0–10.3)
CHLORIDE: 106 meq/L (ref 98–108)
CO2: 25 meq/L (ref 18–33)
CREATININE: 0.7 mg/dL (ref 0.6–1.2)
GLUCOSE: 109 mg/dL (ref 73–118)
POTASSIUM: 3.6 meq/L (ref 3.3–4.7)
Sodium: 138 mEq/L (ref 128–145)
TOTAL PROTEIN: 6.4 g/dL (ref 6.4–8.1)
Total Bilirubin: 0.8 mg/dl (ref 0.20–1.60)

## 2014-12-23 MED ORDER — LORAZEPAM 0.5 MG PO TABS: 0.5000 mg | ORAL_TABLET | Freq: Three times a day (TID) | ORAL | Status: DC

## 2014-12-23 MED ORDER — SODIUM CHLORIDE 0.9 % IV SOLN
1000.0000 mg | Freq: Once | INTRAVENOUS | Status: AC
  Administered 2014-12-23: 1000 mg via INTRAVENOUS
  Filled 2014-12-23: qty 40

## 2014-12-23 MED ORDER — DIPHENHYDRAMINE HCL 25 MG PO CAPS
50.0000 mg | ORAL_CAPSULE | Freq: Once | ORAL | Status: AC
  Administered 2014-12-23: 50 mg via ORAL

## 2014-12-23 MED ORDER — SODIUM CHLORIDE 0.9 % IV SOLN
20.0000 mg | Freq: Once | INTRAVENOUS | Status: AC
  Administered 2014-12-23: 20 mg via INTRAVENOUS
  Filled 2014-12-23: qty 2

## 2014-12-23 MED ORDER — ACETAMINOPHEN 325 MG PO TABS
ORAL_TABLET | ORAL | Status: AC
  Filled 2014-12-23: qty 2

## 2014-12-23 MED ORDER — DIPHENHYDRAMINE HCL 25 MG PO CAPS
ORAL_CAPSULE | ORAL | Status: AC
  Filled 2014-12-23: qty 2

## 2014-12-23 MED ORDER — SODIUM CHLORIDE 0.9 % IV SOLN
Freq: Once | INTRAVENOUS | Status: AC
  Administered 2014-12-23: 12:00:00 via INTRAVENOUS

## 2014-12-23 MED ORDER — ACETAMINOPHEN 325 MG PO TABS
650.0000 mg | ORAL_TABLET | Freq: Once | ORAL | Status: AC
  Administered 2014-12-23: 650 mg via ORAL

## 2014-12-23 NOTE — Patient Instructions (Signed)
Obinutuzumab injection What is this medicine? OBINUTUZUMAB (OH bi nue TOOZ ue mab) is a monoclonal antibody. It is used to treat chronic lymphocytic leukemia. This drug targets a specific protein on cancer cells and stops them from growing. This medicine may be used for other purposes; ask your health care provider or pharmacist if you have questions. COMMON BRAND NAME(S): GAZYVA What should I tell my health care provider before I take this medicine? They need to know if you have any of these conditions: -hepatitis -low blood counts, like low white cell, platelet, or red cell counts -lung or breathing disease -heart disease -an unusual or allergic reaction to ofatumumab, other medicines, foods, dyes, or preservatives -pregnant or trying to get pregnant -breast-feeding How should I use this medicine? This medicine is for infusion into a vein. It is given by a health care professional in a hospital or clinic setting. Talk to your pediatrician regarding the use of this medicine in children. Special care may be needed. Overdosage: If you think you've taken too much of this medicine contact a poison control center or emergency room at once. Overdosage: If you think you have taken too much of this medicine contact a poison control center or emergency room at once. NOTE: This medicine is only for you. Do not share this medicine with others. What if I miss a dose? Keep appointments for follow-up doses as directed. It is important not to miss your dose. Call your doctor or health care professional if you are unable to keep an appointment. What may interact with this medicine? -live virus vaccines This list may not describe all possible interactions. Give your health care provider a list of all the medicines, herbs, non-prescription drugs, or dietary supplements you use. Also tell them if you smoke, drink alcohol, or use illegal drugs. Some items may interact with your medicine. What should I watch  for while using this medicine? Report any side effects that you notice during your treatment right away, such as changes in your breathing, fever, chills, dizziness or lightheadedness. These effects are more common with the first dose. Visit your prescriber or health care professional for checks on your progress. You will need to have regular blood work. Report any other side effects. The side effects of this medicine can continue after you finish your treatment. Continue your course of treatment even though you feel ill unless your doctor tells you to stop. Call your doctor or health care professional for advice if you get a fever, chills or sore throat, or other symptoms of a cold or flu. Do not treat yourself. This drug decreases your body's ability to fight infections. Try to avoid being around people who are sick. This medicine may increase your risk to bruise or bleed. Call your doctor or health care professional if you notice any unusual bleeding. Be careful brushing and flossing your teeth or using a toothpick because you may get an infection or bleed more easily. If you have any dental work done, tell your dentist you are receiving this medicine. Avoid taking products that contain aspirin, acetaminophen, ibuprofen, naproxen, or ketoprofen unless instructed by your doctor. These medicines may hide a fever. Do not become pregnant while taking this medicine. Women should inform their doctor if they wish to become pregnant or think they might be pregnant. There is a potential for serious side effects to an unborn child. Talk to your health care professional or pharmacist for more information. Do not breast-feed an infant while taking this   medicine. What side effects may I notice from receiving this medicine? Side effects that you should report to your doctor or health care professional as soon as possible: -allergic reactions like skin rash, itching or hives, swelling of the face, lips, or  tongue -breathing problems -changes in vision -chest pain or chest tightness -chills -confusion, trouble speaking or understanding -cough -diarrhea -dizziness -fainting spells -fever -general ill feeling or flu-like symptoms -lightheadedness -loss of balance or coordination -nausea, vomiting -right upper belly pain -trouble walking -unusual bleeding or bruising -unusually weak or tired -yellowing of the eyes or skin Side effects that usually do not require medical attention (Report these to your doctor or health care professional if they continue or are bothersome.): -muscle pain -muscle cramps This list may not describe all possible side effects. Call your doctor for medical advice about side effects. You may report side effects to FDA at 1-800-FDA-1088. Where should I keep my medicine? This drug is only given in a hospital or clinic and will not be stored at home. NOTE: This sheet is a summary. It may not cover all possible information. If you have questions about this medicine, talk to your doctor, pharmacist, or health care provider.  2015, Elsevier/Gold Standard. (2012-04-04 18:24:29)  

## 2014-12-29 ENCOUNTER — Telehealth: Payer: Self-pay | Admitting: *Deleted

## 2014-12-29 DIAGNOSIS — C8519 Unspecified B-cell lymphoma, extranodal and solid organ sites: Secondary | ICD-10-CM

## 2014-12-29 MED ORDER — MAGIC MOUTHWASH W/LIDOCAINE: 5.0000 mL | Freq: Four times a day (QID) | ORAL | Status: DC | PRN

## 2014-12-29 NOTE — Telephone Encounter (Signed)
Patient states her mouth is burning. She states it feels like she has been scalded with hot water. She denies any sores, redness or swelling. The pain extends into her throat. Spoke with Dr Marin Olp who is prescribing magic mouthwash. Will call it into her pharmacy. Patient aware of plan.

## 2014-12-30 ENCOUNTER — Ambulatory Visit (HOSPITAL_BASED_OUTPATIENT_CLINIC_OR_DEPARTMENT_OTHER): Payer: BLUE CROSS/BLUE SHIELD

## 2014-12-30 ENCOUNTER — Other Ambulatory Visit (HOSPITAL_BASED_OUTPATIENT_CLINIC_OR_DEPARTMENT_OTHER): Payer: BLUE CROSS/BLUE SHIELD

## 2014-12-30 VITALS — BP 102/48 | HR 83 | Temp 98.2°F | Resp 18

## 2014-12-30 DIAGNOSIS — C8290 Follicular lymphoma, unspecified, unspecified site: Secondary | ICD-10-CM

## 2014-12-30 DIAGNOSIS — F320 Major depressive disorder, single episode, mild: Secondary | ICD-10-CM

## 2014-12-30 DIAGNOSIS — Z5112 Encounter for antineoplastic immunotherapy: Secondary | ICD-10-CM | POA: Diagnosis not present

## 2014-12-30 DIAGNOSIS — F329 Major depressive disorder, single episode, unspecified: Secondary | ICD-10-CM

## 2014-12-30 DIAGNOSIS — C8519 Unspecified B-cell lymphoma, extranodal and solid organ sites: Secondary | ICD-10-CM

## 2014-12-30 DIAGNOSIS — F419 Anxiety disorder, unspecified: Secondary | ICD-10-CM

## 2014-12-30 DIAGNOSIS — F909 Attention-deficit hyperactivity disorder, unspecified type: Secondary | ICD-10-CM

## 2014-12-30 DIAGNOSIS — F17200 Nicotine dependence, unspecified, uncomplicated: Secondary | ICD-10-CM

## 2014-12-30 LAB — CMP (CANCER CENTER ONLY)
ALT(SGPT): 29 U/L (ref 10–47)
AST: 22 U/L (ref 11–38)
Albumin: 3.7 g/dL (ref 3.3–5.5)
Alkaline Phosphatase: 90 U/L — ABNORMAL HIGH (ref 26–84)
BILIRUBIN TOTAL: 0.8 mg/dL (ref 0.20–1.60)
BUN, Bld: 15 mg/dL (ref 7–22)
CALCIUM: 9.4 mg/dL (ref 8.0–10.3)
CO2: 23 meq/L (ref 18–33)
CREATININE: 0.7 mg/dL (ref 0.6–1.2)
Chloride: 107 mEq/L (ref 98–108)
GLUCOSE: 101 mg/dL (ref 73–118)
POTASSIUM: 3.8 meq/L (ref 3.3–4.7)
Sodium: 138 mEq/L (ref 128–145)
Total Protein: 7.1 g/dL (ref 6.4–8.1)

## 2014-12-30 LAB — CBC WITH DIFFERENTIAL (CANCER CENTER ONLY)
BASO#: 0 10*3/uL (ref 0.0–0.2)
BASO%: 0.1 % (ref 0.0–2.0)
EOS ABS: 0.1 10*3/uL (ref 0.0–0.5)
EOS%: 0.9 % (ref 0.0–7.0)
HEMATOCRIT: 39.6 % (ref 34.8–46.6)
HGB: 13.3 g/dL (ref 11.6–15.9)
LYMPH#: 0.4 10*3/uL — ABNORMAL LOW (ref 0.9–3.3)
LYMPH%: 4.1 % — ABNORMAL LOW (ref 14.0–48.0)
MCH: 31.1 pg (ref 26.0–34.0)
MCHC: 33.6 g/dL (ref 32.0–36.0)
MCV: 93 fL (ref 81–101)
MONO#: 0.9 10*3/uL (ref 0.1–0.9)
MONO%: 10 % (ref 0.0–13.0)
NEUT#: 7.5 10*3/uL — ABNORMAL HIGH (ref 1.5–6.5)
NEUT%: 84.9 % — AB (ref 39.6–80.0)
Platelets: 232 10*3/uL (ref 145–400)
RBC: 4.28 10*6/uL (ref 3.70–5.32)
RDW: 14.7 % (ref 11.1–15.7)
WBC: 8.8 10*3/uL (ref 3.9–10.0)

## 2014-12-30 LAB — CHCC SATELLITE - SMEAR

## 2014-12-30 MED ORDER — SODIUM CHLORIDE 0.9 % IV SOLN
Freq: Once | INTRAVENOUS | Status: AC
  Administered 2014-12-30: 11:00:00 via INTRAVENOUS

## 2014-12-30 MED ORDER — DIPHENHYDRAMINE HCL 25 MG PO CAPS
50.0000 mg | ORAL_CAPSULE | Freq: Once | ORAL | Status: AC
  Administered 2014-12-30: 50 mg via ORAL

## 2014-12-30 MED ORDER — SODIUM CHLORIDE 0.9 % IV SOLN
1000.0000 mg | Freq: Once | INTRAVENOUS | Status: AC
  Administered 2014-12-30: 1000 mg via INTRAVENOUS
  Filled 2014-12-30: qty 40

## 2014-12-30 MED ORDER — ACETAMINOPHEN 325 MG PO TABS
ORAL_TABLET | ORAL | Status: AC
  Filled 2014-12-30: qty 2

## 2014-12-30 MED ORDER — DIPHENHYDRAMINE HCL 25 MG PO CAPS
ORAL_CAPSULE | ORAL | Status: AC
  Filled 2014-12-30: qty 2

## 2014-12-30 MED ORDER — DEXAMETHASONE SODIUM PHOSPHATE 100 MG/10ML IJ SOLN
20.0000 mg | Freq: Once | INTRAMUSCULAR | Status: AC
  Administered 2014-12-30: 20 mg via INTRAVENOUS
  Filled 2014-12-30: qty 2

## 2014-12-30 MED ORDER — HEPARIN SOD (PORK) LOCK FLUSH 100 UNIT/ML IV SOLN
500.0000 [IU] | Freq: Once | INTRAVENOUS | Status: DC | PRN
  Filled 2014-12-30: qty 5

## 2014-12-30 MED ORDER — ACETAMINOPHEN 325 MG PO TABS
650.0000 mg | ORAL_TABLET | Freq: Once | ORAL | Status: AC
  Administered 2014-12-30: 650 mg via ORAL

## 2014-12-30 MED ORDER — SODIUM CHLORIDE 0.9 % IJ SOLN
10.0000 mL | INTRAMUSCULAR | Status: DC | PRN
  Filled 2014-12-30: qty 10

## 2014-12-30 NOTE — Patient Instructions (Signed)
[facility] Discharge Instructions for Patients Receiving Chemotherapy  Today you received the following chemotherapy agents Gazyva  To help prevent nausea and vomiting after your treatment, we encourage you to take your nausea medication    If you develop nausea and vomiting that is not controlled by your nausea medication, call the clinic.   BELOW ARE SYMPTOMS THAT SHOULD BE REPORTED IMMEDIATELY:  *FEVER GREATER THAN 100.5 F  *CHILLS WITH OR WITHOUT FEVER  NAUSEA AND VOMITING THAT IS NOT CONTROLLED WITH YOUR NAUSEA MEDICATION  *UNUSUAL SHORTNESS OF BREATH  *UNUSUAL BRUISING OR BLEEDING  TENDERNESS IN MOUTH AND THROAT WITH OR WITHOUT PRESENCE OF ULCERS  *URINARY PROBLEMS  *BOWEL PROBLEMS  UNUSUAL RASH Items with * indicate a potential emergency and should be followed up as soon as possible.  Feel free to call the clinic you have any questions or concerns. The clinic phone number is [phone].  Please show the CHEMO ALERT CARD at check-in to the Emergency Department and triage nurse.   

## 2015-01-01 LAB — BETA 2 MICROGLOBULIN, SERUM: BETA 2 MICROGLOBULIN: 1.64 mg/L (ref <=2.51)

## 2015-01-01 LAB — LACTATE DEHYDROGENASE: LDH: 162 U/L (ref 94–250)

## 2015-01-11 ENCOUNTER — Other Ambulatory Visit: Payer: Self-pay

## 2015-01-11 ENCOUNTER — Telehealth: Payer: Self-pay

## 2015-01-11 DIAGNOSIS — C8519 Unspecified B-cell lymphoma, extranodal and solid organ sites: Secondary | ICD-10-CM

## 2015-01-11 MED ORDER — MAGIC MOUTHWASH W/LIDOCAINE: 15.0000 mL | Freq: Four times a day (QID) | ORAL | Status: DC | PRN

## 2015-01-11 NOTE — Telephone Encounter (Signed)
Received call from pt requesting an early refill for Magic Mouthwash. Pt reports she is using more frequently than four times daily. Also states that her insurance is not covering this medication due to there being an OTC alternative & is costing over $100 to be filled.  Per [facility] pharmacist Zilactin gel is considered an alternative. Pam, [facility] pharmacist estimates that pt can get Magic Mouthwash filled here for ~$40. Jenea aware of these options and requests script be called to [place]. Per Dr Ginette Pitman, dose can be increased to 37mL qid from 87mL. New prescription faxed. dph

## 2015-01-14 ENCOUNTER — Encounter: Payer: Self-pay | Admitting: Hematology & Oncology

## 2015-01-14 ENCOUNTER — Ambulatory Visit (HOSPITAL_BASED_OUTPATIENT_CLINIC_OR_DEPARTMENT_OTHER): Payer: BLUE CROSS/BLUE SHIELD | Admitting: Hematology & Oncology

## 2015-01-14 ENCOUNTER — Ambulatory Visit (HOSPITAL_BASED_OUTPATIENT_CLINIC_OR_DEPARTMENT_OTHER): Payer: BLUE CROSS/BLUE SHIELD

## 2015-01-14 ENCOUNTER — Other Ambulatory Visit (HOSPITAL_BASED_OUTPATIENT_CLINIC_OR_DEPARTMENT_OTHER): Payer: BLUE CROSS/BLUE SHIELD

## 2015-01-14 ENCOUNTER — Other Ambulatory Visit: Payer: Self-pay | Admitting: *Deleted

## 2015-01-14 VITALS — BP 120/48 | HR 86 | Temp 98.0°F | Resp 20 | Ht 62.0 in | Wt 147.0 lb

## 2015-01-14 VITALS — BP 99/54 | HR 82 | Temp 97.9°F | Resp 16

## 2015-01-14 DIAGNOSIS — C8290 Follicular lymphoma, unspecified, unspecified site: Secondary | ICD-10-CM

## 2015-01-14 DIAGNOSIS — C8519 Unspecified B-cell lymphoma, extranodal and solid organ sites: Secondary | ICD-10-CM

## 2015-01-14 DIAGNOSIS — Z5112 Encounter for antineoplastic immunotherapy: Secondary | ICD-10-CM | POA: Diagnosis not present

## 2015-01-14 DIAGNOSIS — Z5111 Encounter for antineoplastic chemotherapy: Secondary | ICD-10-CM | POA: Diagnosis not present

## 2015-01-14 LAB — CBC WITH DIFFERENTIAL (CANCER CENTER ONLY)
BASO#: 0 10*3/uL (ref 0.0–0.2)
BASO%: 0.2 % (ref 0.0–2.0)
EOS%: 1.5 % (ref 0.0–7.0)
Eosinophils Absolute: 0.1 10*3/uL (ref 0.0–0.5)
HCT: 36.1 % (ref 34.8–46.6)
HGB: 12.2 g/dL (ref 11.6–15.9)
LYMPH#: 0.8 10*3/uL — ABNORMAL LOW (ref 0.9–3.3)
LYMPH%: 8.2 % — AB (ref 14.0–48.0)
MCH: 31.3 pg (ref 26.0–34.0)
MCHC: 33.8 g/dL (ref 32.0–36.0)
MCV: 93 fL (ref 81–101)
MONO#: 1.1 10*3/uL — ABNORMAL HIGH (ref 0.1–0.9)
MONO%: 11.3 % (ref 0.0–13.0)
NEUT#: 7.4 10*3/uL — ABNORMAL HIGH (ref 1.5–6.5)
NEUT%: 78.8 % (ref 39.6–80.0)
PLATELETS: 300 10*3/uL (ref 145–400)
RBC: 3.9 10*6/uL (ref 3.70–5.32)
RDW: 14.3 % (ref 11.1–15.7)
WBC: 9.4 10*3/uL (ref 3.9–10.0)

## 2015-01-14 LAB — CMP (CANCER CENTER ONLY)
ALK PHOS: 77 U/L (ref 26–84)
ALT(SGPT): 29 U/L (ref 10–47)
AST: 20 U/L (ref 11–38)
Albumin: 3.7 g/dL (ref 3.3–5.5)
BILIRUBIN TOTAL: 0.6 mg/dL (ref 0.20–1.60)
BUN: 16 mg/dL (ref 7–22)
CO2: 25 meq/L (ref 18–33)
Calcium: 9.7 mg/dL (ref 8.0–10.3)
Chloride: 100 mEq/L (ref 98–108)
Creat: 0.5 mg/dl — ABNORMAL LOW (ref 0.6–1.2)
GLUCOSE: 93 mg/dL (ref 73–118)
POTASSIUM: 3.5 meq/L (ref 3.3–4.7)
Sodium: 138 mEq/L (ref 128–145)
Total Protein: 6.8 g/dL (ref 6.4–8.1)

## 2015-01-14 LAB — LACTATE DEHYDROGENASE: LDH: 166 U/L (ref 94–250)

## 2015-01-14 MED ORDER — SODIUM CHLORIDE 0.9 % IV SOLN
1000.0000 mg | Freq: Once | INTRAVENOUS | Status: AC
  Administered 2015-01-14: 1000 mg via INTRAVENOUS
  Filled 2015-01-14: qty 40

## 2015-01-14 MED ORDER — DIPHENHYDRAMINE HCL 25 MG PO CAPS
ORAL_CAPSULE | ORAL | Status: AC
  Filled 2015-01-14: qty 2

## 2015-01-14 MED ORDER — DIPHENHYDRAMINE HCL 25 MG PO CAPS
50.0000 mg | ORAL_CAPSULE | Freq: Once | ORAL | Status: AC
  Administered 2015-01-14: 50 mg via ORAL

## 2015-01-14 MED ORDER — ACETAMINOPHEN 325 MG PO TABS
650.0000 mg | ORAL_TABLET | Freq: Once | ORAL | Status: AC
  Administered 2015-01-14: 650 mg via ORAL

## 2015-01-14 MED ORDER — SODIUM CHLORIDE 0.9 % IV SOLN
81.0000 mg/m2 | Freq: Once | INTRAVENOUS | Status: AC
  Administered 2015-01-14: 150 mg via INTRAVENOUS
  Filled 2015-01-14: qty 6

## 2015-01-14 MED ORDER — SODIUM CHLORIDE 0.9 % IV SOLN
Freq: Once | INTRAVENOUS | Status: AC
  Administered 2015-01-14: 09:00:00 via INTRAVENOUS

## 2015-01-14 MED ORDER — LORAZEPAM 1 MG PO TABS: 1.0000 mg | ORAL_TABLET | Freq: Three times a day (TID) | ORAL | Status: DC | PRN

## 2015-01-14 MED ORDER — DEXAMETHASONE 4 MG PO TABS: 8.0000 mg | ORAL_TABLET | Freq: Two times a day (BID) | ORAL | Status: DC

## 2015-01-14 MED ORDER — SODIUM CHLORIDE 0.9 % IV SOLN
Freq: Once | INTRAVENOUS | Status: AC
  Administered 2015-01-14: 09:00:00 via INTRAVENOUS
  Filled 2015-01-14: qty 4

## 2015-01-14 MED ORDER — ACETAMINOPHEN 325 MG PO TABS
ORAL_TABLET | ORAL | Status: AC
  Filled 2015-01-14: qty 2

## 2015-01-14 NOTE — Patient Instructions (Signed)
[place] Discharge Instructions for Patients Receiving Chemotherapy  Today you received the following chemotherapy agents Gazyva and Bendeka.  To help prevent nausea and vomiting after your treatment, we encourage you to take your nausea medication.   If you develop nausea and vomiting that is not controlled by your nausea medication, call the clinic.   BELOW ARE SYMPTOMS THAT SHOULD BE REPORTED IMMEDIATELY:  *FEVER GREATER THAN 100.5 F  *CHILLS WITH OR WITHOUT FEVER  NAUSEA AND VOMITING THAT IS NOT CONTROLLED WITH YOUR NAUSEA MEDICATION  *UNUSUAL SHORTNESS OF BREATH  *UNUSUAL BRUISING OR BLEEDING  TENDERNESS IN MOUTH AND THROAT WITH OR WITHOUT PRESENCE OF ULCERS  *URINARY PROBLEMS  *BOWEL PROBLEMS  UNUSUAL RASH Items with * indicate a potential emergency and should be followed up as soon as possible.  Feel free to call the clinic you have any questions or concerns. The clinic phone number is [phone].  Please show the [place] at check-in to the Emergency Department and triage nurse.

## 2015-01-14 NOTE — Progress Notes (Signed)
Hematology and Oncology Follow Up Visit  [name] [id number] 03-Mar-1971 44 y.o. 01/14/2015   Principle Diagnosis:   Recurrent cutaneous follicular B-cell non-Hodgkin's lymphoma  Current Therapy:    Status post cycle 1 of Gazyva/Treanda     Interim History:  [name] is back for follow-up. She is doing quite well. She is already noticed that the nodularity on her back from the lymphoma has resolved.  She's tolerated her first cycle of chemotherapy quite well. She did have some mouth soreness. I may cut the dose of the Treanda back a little bit.  She had no reactions to the [place].  The real problem with her is her husband. He, unfortunately, has metastatic GE junction adenocarcinoma. We're trying to get him on therapy but his insurance company will not approve it. This is a very sad situation.  She has had no problems going to the bathroom. She's had no problems with nausea or vomiting.  She's had no bleeding. There's been no leg swelling.  Medications:  Current outpatient prescriptions:  .  acyclovir (ZOVIRAX) 400 MG tablet, Take 1 tablet (400 mg total) by mouth daily., Disp: 30 tablet, Rfl: 5 .  Alum & Mag Hydroxide-Simeth (MAGIC MOUTHWASH W/LIDOCAINE) SOLN, Take 15 mLs by mouth 4 (four) times daily as needed for mouth pain., Disp: 480 mL, Rfl: 3 .  dexamethasone (DECADRON) 4 MG tablet, Take 2 tablets (8 mg total) by mouth 2 (two) times daily with a meal. Start the day after chemotherapy for 2 days. Take with food., Disp: 30 tablet, Rfl: 1 .  DPH-Lido-AlHydr-MgHydr-Simeth (FIRST-MOUTHWASH BLM) SUSP, SWISH/SWALLOW 5MLS FOUR TIMES A DAY AS NEEDED FOR PAIN, Disp: , Rfl: 3 .  lisdexamfetamine (VYVANSE) 50 MG capsule, Take 1 capsule (50 mg total) by mouth daily., Disp: 30 capsule, Rfl: 0 .  LORazepam (ATIVAN) 0.5 MG tablet, Take 1 tablet (0.5 mg total) by mouth every 8 (eight) hours., Disp: 30 tablet, Rfl: 1 .  ondansetron (ZOFRAN) 8 MG tablet, Take 1 tablet (8 mg total) by  mouth 2 (two) times daily. Start the day after chemo for 2 days. Then take as needed for nausea or vomiting., Disp: 30 tablet, Rfl: 1 .  prochlorperazine (COMPAZINE) 10 MG tablet, Take 1 tablet (10 mg total) by mouth every 6 (six) hours as needed (Nausea or vomiting)., Disp: 30 tablet, Rfl: 1  Allergies: No Known Allergies  Past Medical History, Surgical history, Social history, and Family History were reviewed and updated.  Review of Systems: As above  Physical Exam:  height is 5\' 2"  (1.575 m) and weight is 147 lb (66.679 kg). Her oral temperature is 98 F (36.7 C). Her blood pressure is 120/48 and her pulse is 86. Her respiration is 20.   Wt Readings from Last 3 Encounters:  01/14/15 147 lb (66.679 kg)  12/09/14 147 lb (66.679 kg)  12/01/14 149 lb 6.4 oz (67.767 kg)     Well-developed well nourished white female in no obvious distress. Head and neck exam shows no ocular or oral lesions. She has no palpable cervical or supraclavicular lymph nodes. Lungs are clear. Cardiac exam regular rate and rhythm with no murmurs, rubs or bruits. Abdomen is soft. She has good bowel sounds. There is no fluid wave. There is no palpable liver or spleen tip. Back exam shows the wide local excision scar in the mid upper back. No nodularity is noted under the skin. Extremities shows no clubbing, cyanosis or edema. She has good range motion of her joints. Neurological  exam shows no focal neurological deficits. Skin exam shows no rashes, ecchymoses or petechia.  Lab Results  Component Value Date   WBC 9.4 01/14/2015   HGB 12.2 01/14/2015   HCT 36.1 01/14/2015   MCV 93 01/14/2015   PLT 300 01/14/2015     Chemistry      Component Value Date/Time   NA 138 01/14/2015 0814   NA 140 10/19/2014 1608   NA 137 01/17/2013 1111   K 3.5 01/14/2015 0814   K 3.7 10/19/2014 1608   K 4.1 01/17/2013 1111   CL 100 01/14/2015 0814   CL 106 01/17/2013 1111   CO2 25 01/14/2015 0814   CO2 23 10/19/2014 1608   CO2  26 01/17/2013 1111   BUN 16 01/14/2015 0814   BUN 9.8 10/19/2014 1608   BUN 13 01/17/2013 1111   CREATININE 0.5* 01/14/2015 0814   CREATININE 0.7 10/19/2014 1608   CREATININE 0.6 01/17/2013 1111      Component Value Date/Time   CALCIUM 9.7 01/14/2015 0814   CALCIUM 8.8 10/19/2014 1608   CALCIUM 9.3 01/17/2013 1111   ALKPHOS 77 01/14/2015 0814   ALKPHOS 134 10/19/2014 1608   ALKPHOS 89 01/17/2013 1111   AST 20 01/14/2015 0814   AST 14 10/19/2014 1608   AST 11 01/17/2013 1111   ALT 29 01/14/2015 0814   ALT 15 10/19/2014 1608   ALT 16 01/17/2013 1111   BILITOT 0.60 01/14/2015 0814   BILITOT 0.30 10/19/2014 1608   BILITOT 0.6 01/17/2013 1111         Impression and Plan: [name] is 44 year old white female. She has recurrence of her cutaneous B-cell lymphoma. This is her third recurrence. We are treating her with, with what I hope to be, curative therapy with Gazyva/Treanda.  We will go ahead with the second cycle treatment.  I don't think we have to do scans on her right now.  Hopefully, we will be able to get her husband treated real soon.  I will get her back in one month for her third cycle of treatment.   Volanda Napoleon, MD 8/18/20163:28 PM

## 2015-01-15 ENCOUNTER — Ambulatory Visit (HOSPITAL_BASED_OUTPATIENT_CLINIC_OR_DEPARTMENT_OTHER): Payer: BLUE CROSS/BLUE SHIELD

## 2015-01-15 VITALS — BP 104/58 | HR 84 | Temp 98.8°F | Resp 18

## 2015-01-15 DIAGNOSIS — C8290 Follicular lymphoma, unspecified, unspecified site: Secondary | ICD-10-CM | POA: Diagnosis not present

## 2015-01-15 DIAGNOSIS — C8519 Unspecified B-cell lymphoma, extranodal and solid organ sites: Secondary | ICD-10-CM

## 2015-01-15 DIAGNOSIS — Z5111 Encounter for antineoplastic chemotherapy: Secondary | ICD-10-CM

## 2015-01-15 MED ORDER — SODIUM CHLORIDE 0.9 % IV SOLN
Freq: Once | INTRAVENOUS | Status: AC
  Administered 2015-01-15: 12:00:00 via INTRAVENOUS

## 2015-01-15 MED ORDER — SODIUM CHLORIDE 0.9 % IV SOLN
81.0000 mg/m2 | Freq: Once | INTRAVENOUS | Status: AC
  Administered 2015-01-15: 150 mg via INTRAVENOUS
  Filled 2015-01-15: qty 6

## 2015-01-15 MED ORDER — SODIUM CHLORIDE 0.9 % IV SOLN
Freq: Once | INTRAVENOUS | Status: AC
  Administered 2015-01-15: 12:00:00 via INTRAVENOUS
  Filled 2015-01-15: qty 4

## 2015-01-15 NOTE — Patient Instructions (Signed)
[name] Discharge Instructions for Patients Receiving Chemotherapy  Today you received the following chemotherapy agents Bendeka.  To help prevent nausea and vomiting after your treatment, we encourage you to take your nausea medication.   If you develop nausea and vomiting that is not controlled by your nausea medication, call the clinic.   BELOW ARE SYMPTOMS THAT SHOULD BE REPORTED IMMEDIATELY:  *FEVER GREATER THAN 100.5 F  *CHILLS WITH OR WITHOUT FEVER  NAUSEA AND VOMITING THAT IS NOT CONTROLLED WITH YOUR NAUSEA MEDICATION  *UNUSUAL SHORTNESS OF BREATH  *UNUSUAL BRUISING OR BLEEDING  TENDERNESS IN MOUTH AND THROAT WITH OR WITHOUT PRESENCE OF ULCERS  *URINARY PROBLEMS  *BOWEL PROBLEMS  UNUSUAL RASH Items with * indicate a potential emergency and should be followed up as soon as possible.  Feel free to call the clinic you have any questions or concerns. The clinic phone number is [phone].  Please show the [place] at check-in to the Emergency Department and triage nurse.

## 2015-01-22 ENCOUNTER — Telehealth: Payer: Self-pay | Admitting: Hematology & Oncology

## 2015-01-22 NOTE — Telephone Encounter (Signed)
Pt request that all tx and md appt in the future be on Thursday and Friday due to new work schedule

## 2015-01-29 ENCOUNTER — Other Ambulatory Visit: Payer: Self-pay | Admitting: Family Medicine

## 2015-01-29 ENCOUNTER — Other Ambulatory Visit: Payer: Self-pay | Admitting: *Deleted

## 2015-01-29 DIAGNOSIS — C8519 Unspecified B-cell lymphoma, extranodal and solid organ sites: Secondary | ICD-10-CM

## 2015-01-29 MED ORDER — LISDEXAMFETAMINE DIMESYLATE 50 MG PO CAPS: 50.0000 mg | ORAL_CAPSULE | Freq: Every day | ORAL | Status: DC

## 2015-01-29 MED ORDER — MAGIC MOUTHWASH W/LIDOCAINE: 15.0000 mL | Freq: Four times a day (QID) | ORAL | Status: DC | PRN

## 2015-01-29 NOTE — Telephone Encounter (Signed)
Pt aware Rx is ready for p/u at front desk.

## 2015-01-29 NOTE — Telephone Encounter (Signed)
RF request for Vyvanse LOV: 09/16/14 Next ov: None Last written: 09/16/14 #30 w/ 0RF Please advise. Thanks.

## 2015-02-02 ENCOUNTER — Ambulatory Visit (HOSPITAL_BASED_OUTPATIENT_CLINIC_OR_DEPARTMENT_OTHER): Payer: BLUE CROSS/BLUE SHIELD | Admitting: Oncology

## 2015-02-02 VITALS — BP 122/63 | HR 90 | Temp 98.3°F | Resp 18 | Ht 62.0 in | Wt 153.4 lb

## 2015-02-02 DIAGNOSIS — C8290 Follicular lymphoma, unspecified, unspecified site: Secondary | ICD-10-CM

## 2015-02-02 DIAGNOSIS — C8519 Unspecified B-cell lymphoma, extranodal and solid organ sites: Secondary | ICD-10-CM

## 2015-02-02 MED ORDER — ACYCLOVIR 400 MG PO TABS: 400.0000 mg | ORAL_TABLET | Freq: Two times a day (BID) | ORAL | Status: DC

## 2015-02-02 MED ORDER — DEXAMETHASONE 4 MG PO TABS: 4.0000 mg | ORAL_TABLET | Freq: Two times a day (BID) | ORAL | Status: DC

## 2015-02-02 NOTE — Progress Notes (Signed)
[place]  [phone] [phone]) [phone]     ID: [name] DOB: [birth year]  MR#: [id number]  MWU#:[id number]  Patient Care Team: Tammi Sou, MD as PCP - General (Family Medicine) Volanda Napoleon, MD as Consulting Physician (Oncology) PCP: Tammi Sou, MD GYN: SU: Fanny Skates M.D. OTHER MD: Lavonna Monarch MD, Earle Gell M.D.  CHIEF COMPLAINT: Recurrent non-Hodgkin's lymphoma  CURRENT TREATMENT: obinutuzumab, bendamustine   HISTORY OF PRESENT ILLNESS: From the earlier summary note:  [name] first noted some skin "[name]" in 1994. Sometime around a year later they seem to her to have grown some so she presented to [facility] where a right shoulder skin lesion was biopsied. This showed a B-cell cutaneous non-Hodgkin's lymphoma, with immunoglobulin heavy chain gene rearrangement showing clonal bands in 2 of 3 endonuclease lanes. She was treated with CHOP 6 completed in March 1996, with complete remission  However she had disease recurrence in 2000. She was treated briefly with chlorambucil/rituximab , then rituximab maintenance. Rituximab maintenance was continued until February 2005. The patient then was lost to follow-up.  Sometime in the spring of 2015 she noted some lesions on her back that were strongly suggestive of recurrence of the tumor. She really did not want to seek further medical care for this, but one of the lesions in particular, in the upper mid back, seems to her harder and like it is growing faster. Accordingly she presented for further evaluation and treatment.  Her subsequent history is as detailed below  INTERVAL HISTORY: [name] returns today for follow-up of her cutaneous non-Hodgkin's lymphoma. She is being treated under Dr. Marin Olp and today is day 22 cycle 2 of 6 planned cycles of obinutuzumab and bendamustine. She generally is tolerating the treatment well. She has had an early complete clinical response. She is  receiving her treatments and follow-up in [facility] where her husband [name] is also being treated for his GI cancer.  REVIEW OF SYSTEMS: The main problem she has been having is "her mouth is torn up". She can't get the Magic mouthwash because her insurance won't cover it but her pharmacy is compounding a similar mixture which she does find tolerable. She has no problems with nausea or vomiting. A detailed review of systems today was otherwise entirely benign  PAST MEDICAL HISTORY: Past Medical History  Diagnosis Date  . HSV-2 (herpes simplex virus 2) infection   . Non-Hodgkin's lymphoma of skin     Initial dx age 31--treated with chemo.  Recurrence x 2 after the birth of each of her children--different chemo regimen used.  Recurrence again spring 2016-in the mid/upper back.  Plan is for chemo with Dr. Antonieta Pert office.  . Menorrhagia     Regular: 7 days of bleeding (2 heavy and 5 light).  . Adult ADHD   . Tobacco dependence in remission   . Anxiety and depression     PAST SURGICAL HISTORY: Past Surgical History  Procedure Laterality Date  . Tubal ligation    . Hysteroscopy  08/2007    polyp  . Endometrial ablation  06/2008  . Colonoscopy  appox age [age] yrs    Normal  . Basal cell carcinoma excision      FAMILY HISTORY Family History  Problem Relation Age of Onset  . Cancer Paternal Uncle     colon  . Alcohol abuse Maternal Grandfather   . Cancer Paternal Grandmother   . Heart disease Paternal Grandfather    the patient's parents are still living.  She has 2 brothers, one half-sister. There is no history of lymphoma, leukemia, or other cancer in the family to her knowledge  GYNECOLOGIC HISTORY:  No LMP recorded. Menarche age 32, first live birth age 72, she is [place] P2. She still has regular menstrual periods.  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 Her husband [name] is [name] of an Insurance underwriter. Their children are [name], 16, and [name],  57. The patient is not a church attender    ADVANCED DIRECTIVES: Not in place   HEALTH MAINTENANCE: Social History  Substance Use Topics  . Smoking status: Former Smoker -- 0.50 packs/day for 10 years    Types: Cigarettes    Quit date: 09/08/2012  . Smokeless tobacco: Never Used     Comment: Quit smoking 2 years ago  . Alcohol Use: 0.0 oz/week    0 Standard drinks or equivalent per week     Comment: social     Colonoscopy: 2006/[facility]  PAP:  Bone density:  Lipid panel:  No Known Allergies  Current Outpatient Prescriptions  Medication Sig Dispense Refill  . acyclovir (ZOVIRAX) 400 MG tablet Take 1 tablet (400 mg total) by mouth daily. 30 tablet 5  . dexamethasone (DECADRON) 4 MG tablet Take 2 tablets (8 mg total) by mouth 2 (two) times daily with a meal. Start the day after chemotherapy for 2 days. Take with food. 60 tablet 1  . DPH-Lido-AlHydr-MgHydr-Simeth (FIRST-MOUTHWASH BLM) SUSP SWISH/SWALLOW 5MLS FOUR TIMES A DAY AS NEEDED FOR PAIN  3  . lisdexamfetamine (VYVANSE) 50 MG capsule Take 1 capsule (50 mg total) by mouth daily. 30 capsule 0  . LORazepam (ATIVAN) 1 MG tablet Take 1 tablet (1 mg total) by mouth every 8 (eight) hours as needed for anxiety. 90 tablet 1  . magic mouthwash w/lidocaine SOLN Take 15 mLs by mouth 4 (four) times daily as needed for mouth pain. 480 mL 3  . ondansetron (ZOFRAN) 8 MG tablet Take 1 tablet (8 mg total) by mouth 2 (two) times daily. Start the day after chemo for 2 days. Then take as needed for nausea or vomiting. 30 tablet 1  . prochlorperazine (COMPAZINE) 10 MG tablet Take 1 tablet (10 mg total) by mouth every 6 (six) hours as needed (Nausea or vomiting). 30 tablet 1   No current facility-administered medications for this visit.    OBJECTIVE: Young white woman in no acute distress Filed Vitals:   02/02/15 0854  BP: 122/63  Pulse: 90  Temp: 98.3 F (36.8 C)  Resp: 18     Body mass index is 28.05 kg/(m^2).    ECOG FS:0 -  Asymptomatic  Sclerae unicteric, pupils round and equal Oropharynx clear and moist-- no thrush or other lesions No cervical or supraclavicular adenopathy Lungs no rales or rhonchi Heart regular rate and rhythm Abd soft, nontender, positive bowel sounds MSK no focal spinal tenderness, no upper extremity lymphedema Neuro: nonfocal, well oriented, appropriate affect Skin: I do not palpate any lesions across the back or over the flanks or belly. There are no scalp lesions.     LAB RESULTS: CMP     Component Value Date/Time   NA 138 01/14/2015 0814   NA 140 10/19/2014 1608   NA 137 01/17/2013 1111   K 3.5 01/14/2015 0814   K 3.7 10/19/2014 1608   K 4.1 01/17/2013 1111   CL 100 01/14/2015 0814   CL 106 01/17/2013 1111   CO2 25 01/14/2015 0814   CO2  23 10/19/2014 1608   CO2 26 01/17/2013 1111   GLUCOSE 93 01/14/2015 0814   GLUCOSE 91 10/19/2014 1608   GLUCOSE 85 01/17/2013 1111   BUN 16 01/14/2015 0814   BUN 9.8 10/19/2014 1608   BUN 13 01/17/2013 1111   CREATININE 0.5* 01/14/2015 0814   CREATININE 0.7 10/19/2014 1608   CREATININE 0.6 01/17/2013 1111   CALCIUM 9.7 01/14/2015 0814   CALCIUM 8.8 10/19/2014 1608   CALCIUM 9.3 01/17/2013 1111   PROT 6.8 01/14/2015 0814   PROT 6.7 10/19/2014 1608   PROT 7.0 01/17/2013 1111   ALBUMIN 3.8 10/19/2014 1608   ALBUMIN 4.2 01/17/2013 1111   AST 20 01/14/2015 0814   AST 14 10/19/2014 1608   AST 11 01/17/2013 1111   ALT 29 01/14/2015 0814   ALT 15 10/19/2014 1608   ALT 16 01/17/2013 1111   ALKPHOS 77 01/14/2015 0814   ALKPHOS 134 10/19/2014 1608   ALKPHOS 89 01/17/2013 1111   BILITOT 0.60 01/14/2015 0814   BILITOT 0.30 10/19/2014 1608   BILITOT 0.6 01/17/2013 1111    INo results found for: SPEP, UPEP  Lab Results  Component Value Date   WBC 9.4 01/14/2015   NEUTROABS 7.4* 01/14/2015   HGB 12.2 01/14/2015   HCT 36.1 01/14/2015   MCV 93 01/14/2015   PLT 300 01/14/2015      Chemistry      Component Value Date/Time    NA 138 01/14/2015 0814   NA 140 10/19/2014 1608   NA 137 01/17/2013 1111   K 3.5 01/14/2015 0814   K 3.7 10/19/2014 1608   K 4.1 01/17/2013 1111   CL 100 01/14/2015 0814   CL 106 01/17/2013 1111   CO2 25 01/14/2015 0814   CO2 23 10/19/2014 1608   CO2 26 01/17/2013 1111   BUN 16 01/14/2015 0814   BUN 9.8 10/19/2014 1608   BUN 13 01/17/2013 1111   CREATININE 0.5* 01/14/2015 0814   CREATININE 0.7 10/19/2014 1608   CREATININE 0.6 01/17/2013 1111      Component Value Date/Time   CALCIUM 9.7 01/14/2015 0814   CALCIUM 8.8 10/19/2014 1608   CALCIUM 9.3 01/17/2013 1111   ALKPHOS 77 01/14/2015 0814   ALKPHOS 134 10/19/2014 1608   ALKPHOS 89 01/17/2013 1111   AST 20 01/14/2015 0814   AST 14 10/19/2014 1608   AST 11 01/17/2013 1111   ALT 29 01/14/2015 0814   ALT 15 10/19/2014 1608   ALT 16 01/17/2013 1111   BILITOT 0.60 01/14/2015 0814   BILITOT 0.30 10/19/2014 1608   BILITOT 0.6 01/17/2013 1111       No results found for: LABCA2  No components found for: LABCA125  No results for input(s): INR in the last 168 hours.  Urinalysis No results found for: COLORURINE, APPEARANCEUR, LABSPEC, PHURINE, GLUCOSEU, HGBUR, BILIRUBINUR, KETONESUR, PROTEINUR, UROBILINOGEN, NITRITE, LEUKOCYTESUR  STUDIES: No results found.  ASSESSMENT: 44 y.o. [place] woman with a history of B-cell cutaneous non-Hodgkin's lymphoma initially diagnosed in 1995 through biopsy of a right shoulder lesion at [facility], with immunoglobulin heavy chain gene rearrangement studies at that time showing clonal bands in 2 of 3 internuclear ACE lanes  (1) received cyclophosphamide, vincristine, doxorubicin and prednisone (CHOP) 6 cycles completed March 1996  (2) recurrence in 2000 treated with chlorambucil, then rituximab maintenance, last dose February 2004  (3) recurrence noted clinically May 2016 with several lesions on the back, as well as night sweats, but no other "B" symptoms  (a) PET scanning in 11/01/2024  shows 2 skin lesions overlying the thoracic spine but no nodal or visceral involvement  (b) biopsy of an upper mid back skin lesion 11/20/2014 shows primary cutaneous follicle center B-cell non-Hodgkin's lymphoma  (4) obinutuzumab/ bendamustine started 12/16/2014, with 6 cycles planned  (a) hepatitis serologies 12/16/2014 negative  PLAN: Sharnese is tolerating her treatments well. I made minimal adjustments in her medications: Changed the acyclovir to twice a day and dropped the dexamethasone to 4 mg daily instead of 8 on days 2 and 3 of each cycle. Hopeful this will help a little with her mouth problems.  Her husband also is being treated in [facility] and both of them have an excellent rapport with Dr. Marin Olp. Accordingly I have not made [name] any further appointments here. She knows we will be glad to serve as "back up" at any point.   I will keep up with her progress through Dr. Antonieta Pert notes   Chauncey Cruel, MD   02/02/2015 9:11 AM Medical Oncology and Hematology [facility] [place], [address] [address] Tel. [phone]    Fax. [phone]

## 2015-02-03 ENCOUNTER — Other Ambulatory Visit: Payer: Self-pay | Admitting: *Deleted

## 2015-02-03 DIAGNOSIS — C8519 Unspecified B-cell lymphoma, extranodal and solid organ sites: Secondary | ICD-10-CM

## 2015-02-03 MED ORDER — ACYCLOVIR 400 MG PO TABS: 400.0000 mg | ORAL_TABLET | Freq: Two times a day (BID) | ORAL | Status: DC

## 2015-02-05 ENCOUNTER — Encounter: Payer: Self-pay | Admitting: Family Medicine

## 2015-02-10 ENCOUNTER — Other Ambulatory Visit (HOSPITAL_BASED_OUTPATIENT_CLINIC_OR_DEPARTMENT_OTHER): Payer: BLUE CROSS/BLUE SHIELD

## 2015-02-10 ENCOUNTER — Ambulatory Visit: Payer: BLUE CROSS/BLUE SHIELD

## 2015-02-10 ENCOUNTER — Ambulatory Visit (HOSPITAL_BASED_OUTPATIENT_CLINIC_OR_DEPARTMENT_OTHER): Payer: BLUE CROSS/BLUE SHIELD

## 2015-02-10 ENCOUNTER — Ambulatory Visit (HOSPITAL_BASED_OUTPATIENT_CLINIC_OR_DEPARTMENT_OTHER): Payer: BLUE CROSS/BLUE SHIELD | Admitting: Hematology & Oncology

## 2015-02-10 ENCOUNTER — Encounter: Payer: Self-pay | Admitting: Hematology & Oncology

## 2015-02-10 VITALS — BP 104/70 | HR 81 | Temp 97.6°F | Resp 16 | Ht 62.0 in | Wt 152.0 lb

## 2015-02-10 DIAGNOSIS — C8580 Other specified types of non-Hodgkin lymphoma, unspecified site: Secondary | ICD-10-CM

## 2015-02-10 DIAGNOSIS — Z23 Encounter for immunization: Secondary | ICD-10-CM | POA: Diagnosis not present

## 2015-02-10 DIAGNOSIS — C8519 Unspecified B-cell lymphoma, extranodal and solid organ sites: Secondary | ICD-10-CM

## 2015-02-10 DIAGNOSIS — Z0000 Encounter for general adult medical examination without abnormal findings: Secondary | ICD-10-CM

## 2015-02-10 LAB — CBC WITH DIFFERENTIAL (CANCER CENTER ONLY)
BASO#: 0 10*3/uL (ref 0.0–0.2)
BASO%: 0.6 % (ref 0.0–2.0)
EOS ABS: 0.3 10*3/uL (ref 0.0–0.5)
EOS%: 4.9 % (ref 0.0–7.0)
HCT: 36.2 % (ref 34.8–46.6)
HEMOGLOBIN: 11.7 g/dL (ref 11.6–15.9)
LYMPH#: 1.1 10*3/uL (ref 0.9–3.3)
LYMPH%: 20 % (ref 14.0–48.0)
MCH: 30.7 pg (ref 26.0–34.0)
MCHC: 32.3 g/dL (ref 32.0–36.0)
MCV: 95 fL (ref 81–101)
MONO#: 0.8 10*3/uL (ref 0.1–0.9)
MONO%: 15 % — ABNORMAL HIGH (ref 0.0–13.0)
NEUT%: 59.5 % (ref 39.6–80.0)
NEUTROS ABS: 3.1 10*3/uL (ref 1.5–6.5)
Platelets: 242 10*3/uL (ref 145–400)
RBC: 3.81 10*6/uL (ref 3.70–5.32)
RDW: 14.3 % (ref 11.1–15.7)
WBC: 5.3 10*3/uL (ref 3.9–10.0)

## 2015-02-10 LAB — CMP (CANCER CENTER ONLY)
ALBUMIN: 3.6 g/dL (ref 3.3–5.5)
ALK PHOS: 100 U/L — AB (ref 26–84)
ALT: 34 U/L (ref 10–47)
AST: 26 U/L (ref 11–38)
BILIRUBIN TOTAL: 0.5 mg/dL (ref 0.20–1.60)
BUN, Bld: 8 mg/dL (ref 7–22)
CALCIUM: 8.9 mg/dL (ref 8.0–10.3)
CO2: 25 mEq/L (ref 18–33)
Chloride: 107 mEq/L (ref 98–108)
Creat: 0.6 mg/dl (ref 0.6–1.2)
GLUCOSE: 97 mg/dL (ref 73–118)
Potassium: 4 mEq/L (ref 3.3–4.7)
Sodium: 140 mEq/L (ref 128–145)
TOTAL PROTEIN: 6.2 g/dL — AB (ref 6.4–8.1)

## 2015-02-10 LAB — LACTATE DEHYDROGENASE: LDH: 199 U/L (ref 94–250)

## 2015-02-10 MED ORDER — INFLUENZA VAC SPLIT QUAD 0.5 ML IM SUSY
0.5000 mL | PREFILLED_SYRINGE | Freq: Once | INTRAMUSCULAR | Status: AC
  Administered 2015-02-10: 0.5 mL via INTRAMUSCULAR
  Filled 2015-02-10: qty 0.5

## 2015-02-10 NOTE — Progress Notes (Signed)
Hematology and Oncology Follow Up Visit  [name] [id number] [birth year] 44 y.o. 02/10/2015   Principle Diagnosis:   Recurrent cutaneous follicular B-cell non-Hodgkin's lymphoma  Current Therapy:    Status post cycle  #2 of Gazyva/Treanda     Interim History:  [name] is back for follow-up. She is doing quite well.she really has had very few problems with chemotherapy.  She does have some emotional issues on in the morning. Her husband says that she wakes up crying. A much sure exactly what that means. I would not put her on any medications. She wants her back to work next week. I'll see any issues with her doing that.  Her children are doing okay. Their oldest son just got a job. Hopefully, this will help out.  She's had some hot flashes and sweats. She still has her normal monthly cycles.  She's had no rashes.  His been no cough or shortness of breath.  Overall, her performance status is ECOG 1.     Medications:  Current outpatient prescriptions:  .  acyclovir (ZOVIRAX) 400 MG tablet, Take 1 tablet (400 mg total) by mouth 2 (two) times daily., Disp: 60 tablet, Rfl: 5 .  dexamethasone (DECADRON) 4 MG tablet, Take 1 tablet (4 mg total) by mouth 2 (two) times daily with a meal. Start the day after chemotherapy for 2 days. Take with food., Disp: 60 tablet, Rfl: 1 .  DPH-Lido-AlHydr-MgHydr-Simeth (FIRST-MOUTHWASH BLM) SUSP, SWISH/SWALLOW 5MLS FOUR TIMES A DAY AS NEEDED FOR PAIN, Disp: , Rfl: 3 .  lisdexamfetamine (VYVANSE) 50 MG capsule, Take 1 capsule (50 mg total) by mouth daily., Disp: 30 capsule, Rfl: 0 .  LORazepam (ATIVAN) 1 MG tablet, Take 1 tablet (1 mg total) by mouth every 8 (eight) hours as needed for anxiety., Disp: 90 tablet, Rfl: 1 .  magic mouthwash w/lidocaine SOLN, , Disp: , Rfl: 3 .  ondansetron (ZOFRAN) 8 MG tablet, Take 1 tablet (8 mg total) by mouth 2 (two) times daily. Start the day after chemo for 2 days. Then take as needed for nausea or vomiting.,  Disp: 30 tablet, Rfl: 1 .  prochlorperazine (COMPAZINE) 10 MG tablet, Take 1 tablet (10 mg total) by mouth every 6 (six) hours as needed (Nausea or vomiting)., Disp: 30 tablet, Rfl: 1  Current facility-administered medications:  .  Influenza vac split quadrivalent PF (FLUARIX) injection 0.5 mL, 0.5 mL, Intramuscular, Once, Volanda Napoleon, MD  Allergies: No Known Allergies  Past Medical History, Surgical history, Social history, and Family History were reviewed and updated.  Review of Systems: As above  Physical Exam:  height is 5\' 2"  (1.575 m) and weight is 152 lb (68.947 kg). Her oral temperature is 97.6 F (36.4 C). Her blood pressure is 104/70 and her pulse is 81. Her respiration is 16.   Wt Readings from Last 3 Encounters:  02/10/15 152 lb (68.947 kg)  02/02/15 153 lb 6.4 oz (69.582 kg)  01/14/15 147 lb (66.679 kg)     Well-developed well nourished white female in no obvious distress. Head and neck exam shows no ocular or oral lesions. She has no palpable cervical or supraclavicular lymph nodes. Lungs are clear. Cardiac exam regular rate and rhythm with no murmurs, rubs or bruits. Abdomen is soft. She has good bowel sounds. There is no fluid wave. There is no palpable liver or spleen tip. Back exam shows the wide local excision scar in the mid upper back. No nodularity is noted under the skin. Extremities shows  no clubbing, cyanosis or edema. She has good range motion of her joints. Neurological exam shows no focal neurological deficits. Skin exam shows no rashes, ecchymoses or petechia.  Lab Results  Component Value Date   WBC 5.3 02/10/2015   HGB 11.7 02/10/2015   HCT 36.2 02/10/2015   MCV 95 02/10/2015   PLT 242 02/10/2015     Chemistry      Component Value Date/Time   NA 138 01/14/2015 0814   NA 140 10/19/2014 1608   NA 137 01/17/2013 1111   K 3.5 01/14/2015 0814   K 3.7 10/19/2014 1608   K 4.1 01/17/2013 1111   CL 100 01/14/2015 0814   CL 106 01/17/2013 1111    CO2 25 01/14/2015 0814   CO2 23 10/19/2014 1608   CO2 26 01/17/2013 1111   BUN 16 01/14/2015 0814   BUN 9.8 10/19/2014 1608   BUN 13 01/17/2013 1111   CREATININE 0.5* 01/14/2015 0814   CREATININE 0.7 10/19/2014 1608   CREATININE 0.6 01/17/2013 1111      Component Value Date/Time   CALCIUM 9.7 01/14/2015 0814   CALCIUM 8.8 10/19/2014 1608   CALCIUM 9.3 01/17/2013 1111   ALKPHOS 77 01/14/2015 0814   ALKPHOS 134 10/19/2014 1608   ALKPHOS 89 01/17/2013 1111   AST 20 01/14/2015 0814   AST 14 10/19/2014 1608   AST 11 01/17/2013 1111   ALT 29 01/14/2015 0814   ALT 15 10/19/2014 1608   ALT 16 01/17/2013 1111   BILITOT 0.60 01/14/2015 0814   BILITOT 0.30 10/19/2014 1608   BILITOT 0.6 01/17/2013 1111         Impression and Plan: [name] is 44 year old white female. She has recurrence of her cutaneous B-cell lymphoma. This is her third recurrence. We are treating her with, with what I hope to be, curative therapy with Gazyva/Treanda.  We will go ahead wither third cycle of chemotherapy.   I don't think we have to do scans on her right now.  I will get her back in one month for her third cycle of treatment.   Volanda Napoleon, MD 9/14/20169:40 AM

## 2015-02-10 NOTE — Patient Instructions (Signed)

## 2015-02-11 ENCOUNTER — Ambulatory Visit: Payer: BLUE CROSS/BLUE SHIELD

## 2015-02-11 ENCOUNTER — Ambulatory Visit (HOSPITAL_BASED_OUTPATIENT_CLINIC_OR_DEPARTMENT_OTHER): Payer: BLUE CROSS/BLUE SHIELD

## 2015-02-11 VITALS — BP 106/54 | HR 84 | Temp 97.9°F | Resp 18

## 2015-02-11 DIAGNOSIS — Z5111 Encounter for antineoplastic chemotherapy: Secondary | ICD-10-CM | POA: Diagnosis not present

## 2015-02-11 DIAGNOSIS — C8290 Follicular lymphoma, unspecified, unspecified site: Secondary | ICD-10-CM | POA: Diagnosis not present

## 2015-02-11 DIAGNOSIS — Z5112 Encounter for antineoplastic immunotherapy: Secondary | ICD-10-CM | POA: Diagnosis not present

## 2015-02-11 DIAGNOSIS — C8519 Unspecified B-cell lymphoma, extranodal and solid organ sites: Secondary | ICD-10-CM

## 2015-02-11 MED ORDER — SODIUM CHLORIDE 0.9 % IJ SOLN
10.0000 mL | INTRAMUSCULAR | Status: DC | PRN
  Filled 2015-02-11: qty 10

## 2015-02-11 MED ORDER — ACETAMINOPHEN 325 MG PO TABS
650.0000 mg | ORAL_TABLET | Freq: Once | ORAL | Status: AC
  Administered 2015-02-11: 650 mg via ORAL

## 2015-02-11 MED ORDER — SODIUM CHLORIDE 0.9 % IV SOLN
1000.0000 mg | Freq: Once | INTRAVENOUS | Status: AC
  Administered 2015-02-11: 1000 mg via INTRAVENOUS
  Filled 2015-02-11: qty 40

## 2015-02-11 MED ORDER — HEPARIN SOD (PORK) LOCK FLUSH 100 UNIT/ML IV SOLN
500.0000 [IU] | Freq: Once | INTRAVENOUS | Status: DC | PRN
  Filled 2015-02-11: qty 5

## 2015-02-11 MED ORDER — DIPHENHYDRAMINE HCL 25 MG PO CAPS
50.0000 mg | ORAL_CAPSULE | Freq: Once | ORAL | Status: AC
  Administered 2015-02-11: 50 mg via ORAL

## 2015-02-11 MED ORDER — SODIUM CHLORIDE 0.9 % IV SOLN
Freq: Once | INTRAVENOUS | Status: AC
  Administered 2015-02-11: 09:00:00 via INTRAVENOUS
  Filled 2015-02-11: qty 4

## 2015-02-11 MED ORDER — ACETAMINOPHEN 325 MG PO TABS
ORAL_TABLET | ORAL | Status: AC
  Filled 2015-02-11: qty 2

## 2015-02-11 MED ORDER — SODIUM CHLORIDE 0.9 % IV SOLN
81.0000 mg/m2 | Freq: Once | INTRAVENOUS | Status: AC
  Administered 2015-02-11: 150 mg via INTRAVENOUS
  Filled 2015-02-11: qty 6

## 2015-02-11 MED ORDER — DIPHENHYDRAMINE HCL 25 MG PO CAPS
ORAL_CAPSULE | ORAL | Status: AC
  Filled 2015-02-11: qty 2

## 2015-02-11 MED ORDER — SODIUM CHLORIDE 0.9 % IV SOLN
Freq: Once | INTRAVENOUS | Status: AC
  Administered 2015-02-11: 09:00:00 via INTRAVENOUS

## 2015-02-11 NOTE — Patient Instructions (Signed)
[facility] Discharge Instructions for Patients Receiving Chemotherapy  Today you received the following chemotherapy agents Gazyva and Bendamustine.   To help prevent nausea and vomiting after your treatment, we encourage you to take your nausea medication.   If you develop nausea and vomiting that is not controlled by your nausea medication, call the clinic.   BELOW ARE SYMPTOMS THAT SHOULD BE REPORTED IMMEDIATELY:  *FEVER GREATER THAN 100.5 F  *CHILLS WITH OR WITHOUT FEVER  NAUSEA AND VOMITING THAT IS NOT CONTROLLED WITH YOUR NAUSEA MEDICATION  *UNUSUAL SHORTNESS OF BREATH  *UNUSUAL BRUISING OR BLEEDING  TENDERNESS IN MOUTH AND THROAT WITH OR WITHOUT PRESENCE OF ULCERS  *URINARY PROBLEMS  *BOWEL PROBLEMS  UNUSUAL RASH Items with * indicate a potential emergency and should be followed up as soon as possible.  Feel free to call the clinic you have any questions or concerns. The clinic phone number is [phone].  Please show the CHEMO ALERT CARD at check-in to the Emergency Department and triage nurse.   

## 2015-02-12 ENCOUNTER — Ambulatory Visit (HOSPITAL_BASED_OUTPATIENT_CLINIC_OR_DEPARTMENT_OTHER): Payer: BLUE CROSS/BLUE SHIELD

## 2015-02-12 VITALS — BP 101/48 | HR 71 | Temp 97.9°F | Resp 18

## 2015-02-12 DIAGNOSIS — Z5111 Encounter for antineoplastic chemotherapy: Secondary | ICD-10-CM | POA: Diagnosis not present

## 2015-02-12 DIAGNOSIS — C8519 Unspecified B-cell lymphoma, extranodal and solid organ sites: Secondary | ICD-10-CM

## 2015-02-12 DIAGNOSIS — C8290 Follicular lymphoma, unspecified, unspecified site: Secondary | ICD-10-CM | POA: Diagnosis not present

## 2015-02-12 MED ORDER — SODIUM CHLORIDE 0.9 % IV SOLN
Freq: Once | INTRAVENOUS | Status: AC
  Administered 2015-02-12: 11:00:00 via INTRAVENOUS
  Filled 2015-02-12: qty 4

## 2015-02-12 MED ORDER — SODIUM CHLORIDE 0.9 % IV SOLN
Freq: Once | INTRAVENOUS | Status: AC
  Administered 2015-02-12: 11:00:00 via INTRAVENOUS

## 2015-02-12 MED ORDER — SODIUM CHLORIDE 0.9 % IV SOLN
81.0000 mg/m2 | Freq: Once | INTRAVENOUS | Status: AC
  Administered 2015-02-12: 150 mg via INTRAVENOUS
  Filled 2015-02-12: qty 6

## 2015-02-12 NOTE — Patient Instructions (Signed)
Bendamustine Injection What is this medicine? BENDAMUSTINE (BEN da MUS teen) is a chemotherapy drug. It is used to treat chronic lymphocytic leukemia and non-Hodgkin lymphoma. This medicine may be used for other purposes; ask your health care provider or pharmacist if you have questions. COMMON BRAND NAME(S): Treanda What should I tell my health care provider before I take this medicine? They need to know if you have any of these conditions: -kidney disease -liver disease -an unusual or allergic reaction to bendamustine, mannitol, other medicines, foods, dyes, or preservatives -pregnant or trying to get pregnant -breast-feeding How should I use this medicine? This medicine is for infusion into a vein. It is given by a health care professional in a hospital or clinic setting. Talk to your pediatrician regarding the use of this medicine in children. Special care may be needed. Overdosage: If you think you have taken too much of this medicine contact a poison control center or emergency room at once. NOTE: This medicine is only for you. Do not share this medicine with others. What if I miss a dose? It is important not to miss your dose. Call your doctor or health care professional if you are unable to keep an appointment. What may interact with this medicine? Do not take this medicine with any of the following medications: -clozapine This medicine may also interact with the following medications: -atazanavir -cimetidine -ciprofloxacin -enoxacin -fluvoxamine -medicines for seizures like carbamazepine and phenobarbital -mexiletine -rifampin -tacrine -thiabendazole -zileuton This list may not describe all possible interactions. Give your health care provider a list of all the medicines, herbs, non-prescription drugs, or dietary supplements you use. Also tell them if you smoke, drink alcohol, or use illegal drugs. Some items may interact with your medicine. What should I watch for while  using this medicine? Your condition will be monitored carefully while you are receiving this medicine. This drug may make you feel generally unwell. This is not uncommon, as chemotherapy can affect healthy cells as well as cancer cells. Report any side effects. Continue your course of treatment even though you feel ill unless your doctor tells you to stop. Call your doctor or health care professional for advice if you get a fever, chills or sore throat, or other symptoms of a cold or flu. Do not treat yourself. This drug decreases your body's ability to fight infections. Try to avoid being around people who are sick. This medicine may increase your risk to bruise or bleed. Call your doctor or health care professional if you notice any unusual bleeding. Be careful brushing and flossing your teeth or using a toothpick because you may get an infection or bleed more easily. If you have any dental work done, tell your dentist you are receiving this medicine. Avoid taking products that contain aspirin, acetaminophen, ibuprofen, naproxen, or ketoprofen unless instructed by your doctor. These medicines may hide a fever. Do not become pregnant while taking this medicine. Women should inform their doctor if they wish to become pregnant or think they might be pregnant. There is a potential for serious side effects to an unborn child. Men should inform their doctors if they wish to father a child. This medicine may lower sperm counts. Talk to your health care professional or pharmacist for more information. Do not breast-feed an infant while taking this medicine. What side effects may I notice from receiving this medicine? Side effects that you should report to your doctor or health care professional as soon as possible: -allergic reactions like skin rash,   itching or hives, swelling of the face, lips, or tongue -low blood counts - this medicine may decrease the number of white blood cells, red blood cells and  platelets. You may be at increased risk for infections and bleeding. -signs of infection - fever or chills, cough, sore throat, pain or difficulty passing urine -signs of decreased platelets or bleeding - bruising, pinpoint red spots on the skin, black, tarry stools, blood in the urine -signs of decreased red blood cells - unusually weak or tired, fainting spells, lightheadedness -trouble passing urine or change in the amount of urine Side effects that usually do not require medical attention (report to your doctor or health care professional if they continue or are bothersome): -diarrhea This list may not describe all possible side effects. Call your doctor for medical advice about side effects. You may report side effects to FDA at 1-800-FDA-1088. Where should I keep my medicine? This drug is given in a hospital or clinic and will not be stored at home. NOTE: This sheet is a summary. It may not cover all possible information. If you have questions about this medicine, talk to your doctor, pharmacist, or health care provider.  2015, [employer]. (2011-08-11 14:15:47)  

## 2015-03-03 ENCOUNTER — Other Ambulatory Visit: Payer: Self-pay | Admitting: Family Medicine

## 2015-03-03 MED ORDER — LISDEXAMFETAMINE DIMESYLATE 50 MG PO CAPS: 50.0000 mg | ORAL_CAPSULE | Freq: Every day | ORAL | Status: DC

## 2015-03-03 NOTE — Telephone Encounter (Signed)
RF request for vyvanse LOV: 09/16/14 Next ov: None Last written: 01/29/15 #30 w/ 0RF Please advise. Thanks.

## 2015-03-10 ENCOUNTER — Other Ambulatory Visit: Payer: BLUE CROSS/BLUE SHIELD

## 2015-03-10 ENCOUNTER — Ambulatory Visit: Payer: BLUE CROSS/BLUE SHIELD | Admitting: Hematology & Oncology

## 2015-03-10 ENCOUNTER — Ambulatory Visit: Payer: BLUE CROSS/BLUE SHIELD

## 2015-03-11 ENCOUNTER — Ambulatory Visit (HOSPITAL_BASED_OUTPATIENT_CLINIC_OR_DEPARTMENT_OTHER): Payer: BLUE CROSS/BLUE SHIELD | Admitting: Hematology & Oncology

## 2015-03-11 ENCOUNTER — Other Ambulatory Visit: Payer: Self-pay | Admitting: *Deleted

## 2015-03-11 ENCOUNTER — Ambulatory Visit (HOSPITAL_BASED_OUTPATIENT_CLINIC_OR_DEPARTMENT_OTHER): Payer: BLUE CROSS/BLUE SHIELD

## 2015-03-11 ENCOUNTER — Other Ambulatory Visit (HOSPITAL_BASED_OUTPATIENT_CLINIC_OR_DEPARTMENT_OTHER): Payer: BLUE CROSS/BLUE SHIELD

## 2015-03-11 ENCOUNTER — Encounter: Payer: Self-pay | Admitting: Hematology & Oncology

## 2015-03-11 ENCOUNTER — Ambulatory Visit: Payer: BLUE CROSS/BLUE SHIELD

## 2015-03-11 VITALS — BP 112/42 | HR 87 | Temp 97.8°F | Resp 16 | Ht 62.0 in | Wt 152.0 lb

## 2015-03-11 VITALS — BP 93/56 | HR 82 | Temp 98.2°F | Resp 16

## 2015-03-11 DIAGNOSIS — C8580 Other specified types of non-Hodgkin lymphoma, unspecified site: Secondary | ICD-10-CM | POA: Diagnosis not present

## 2015-03-11 DIAGNOSIS — C8519 Unspecified B-cell lymphoma, extranodal and solid organ sites: Secondary | ICD-10-CM

## 2015-03-11 DIAGNOSIS — Z0000 Encounter for general adult medical examination without abnormal findings: Secondary | ICD-10-CM

## 2015-03-11 DIAGNOSIS — Z5112 Encounter for antineoplastic immunotherapy: Secondary | ICD-10-CM

## 2015-03-11 LAB — CBC WITH DIFFERENTIAL (CANCER CENTER ONLY)
BASO#: 0.1 10*3/uL (ref 0.0–0.2)
BASO%: 1 % (ref 0.0–2.0)
EOS%: 7.1 % — AB (ref 0.0–7.0)
Eosinophils Absolute: 0.4 10*3/uL (ref 0.0–0.5)
HEMATOCRIT: 37.9 % (ref 34.8–46.6)
HGB: 12.3 g/dL (ref 11.6–15.9)
LYMPH#: 0.8 10*3/uL — AB (ref 0.9–3.3)
LYMPH%: 15.7 % (ref 14.0–48.0)
MCH: 30.8 pg (ref 26.0–34.0)
MCHC: 32.5 g/dL (ref 32.0–36.0)
MCV: 95 fL (ref 81–101)
MONO#: 0.5 10*3/uL (ref 0.1–0.9)
MONO%: 10.4 % (ref 0.0–13.0)
NEUT#: 3.4 10*3/uL (ref 1.5–6.5)
NEUT%: 65.8 % (ref 39.6–80.0)
PLATELETS: 277 10*3/uL (ref 145–400)
RBC: 3.99 10*6/uL (ref 3.70–5.32)
RDW: 13.8 % (ref 11.1–15.7)
WBC: 5.1 10*3/uL (ref 3.9–10.0)

## 2015-03-11 LAB — CMP (CANCER CENTER ONLY)
ALK PHOS: 131 U/L — AB (ref 26–84)
ALT: 39 U/L (ref 10–47)
AST: 35 U/L (ref 11–38)
Albumin: 3.6 g/dL (ref 3.3–5.5)
BILIRUBIN TOTAL: 0.5 mg/dL (ref 0.20–1.60)
BUN: 8 mg/dL (ref 7–22)
CO2: 26 mEq/L (ref 18–33)
Calcium: 9.2 mg/dL (ref 8.0–10.3)
Chloride: 104 mEq/L (ref 98–108)
Creat: 0.6 mg/dl (ref 0.6–1.2)
GLUCOSE: 124 mg/dL — AB (ref 73–118)
Potassium: 4 mEq/L (ref 3.3–4.7)
Sodium: 139 mEq/L (ref 128–145)
Total Protein: 6.7 g/dL (ref 6.4–8.1)

## 2015-03-11 MED ORDER — ACETAMINOPHEN 325 MG PO TABS
ORAL_TABLET | ORAL | Status: AC
  Filled 2015-03-11: qty 2

## 2015-03-11 MED ORDER — SODIUM CHLORIDE 0.9 % IV SOLN
Freq: Once | INTRAVENOUS | Status: AC
  Administered 2015-03-11: 11:00:00 via INTRAVENOUS

## 2015-03-11 MED ORDER — ONDANSETRON HCL 8 MG PO TABS: 8.0000 mg | ORAL_TABLET | Freq: Two times a day (BID) | ORAL | Status: DC

## 2015-03-11 MED ORDER — SODIUM CHLORIDE 0.9 % IV SOLN
81.0000 mg/m2 | Freq: Once | INTRAVENOUS | Status: AC
  Administered 2015-03-11: 150 mg via INTRAVENOUS
  Filled 2015-03-11: qty 6

## 2015-03-11 MED ORDER — SODIUM CHLORIDE 0.9 % IV SOLN
Freq: Once | INTRAVENOUS | Status: AC
  Administered 2015-03-11: 12:00:00 via INTRAVENOUS
  Filled 2015-03-11: qty 4

## 2015-03-11 MED ORDER — ACYCLOVIR 400 MG PO TABS
400.0000 mg | ORAL_TABLET | Freq: Two times a day (BID) | ORAL | Status: DC
Start: 2015-03-11 — End: 2015-08-26

## 2015-03-11 MED ORDER — SODIUM CHLORIDE 0.9 % IV SOLN
1000.0000 mg | Freq: Once | INTRAVENOUS | Status: AC
  Administered 2015-03-11: 1000 mg via INTRAVENOUS
  Filled 2015-03-11: qty 40

## 2015-03-11 MED ORDER — DIPHENHYDRAMINE HCL 25 MG PO CAPS
50.0000 mg | ORAL_CAPSULE | Freq: Once | ORAL | Status: AC
  Administered 2015-03-11: 50 mg via ORAL

## 2015-03-11 MED ORDER — PROCHLORPERAZINE MALEATE 10 MG PO TABS: 10.0000 mg | ORAL_TABLET | Freq: Four times a day (QID) | ORAL | Status: DC | PRN

## 2015-03-11 MED ORDER — MAGIC MOUTHWASH W/LIDOCAINE: 5.0000 mL | Freq: Four times a day (QID) | ORAL | Status: DC

## 2015-03-11 MED ORDER — DIPHENHYDRAMINE HCL 25 MG PO CAPS
ORAL_CAPSULE | ORAL | Status: AC
  Filled 2015-03-11: qty 2

## 2015-03-11 MED ORDER — ACETAMINOPHEN 325 MG PO TABS
650.0000 mg | ORAL_TABLET | Freq: Once | ORAL | Status: AC
  Administered 2015-03-11: 650 mg via ORAL

## 2015-03-11 NOTE — Patient Instructions (Signed)
[facility] Discharge Instructions for Patients Receiving Chemotherapy  Today you received the following chemotherapy agents Gazyva and Bendamustine.   To help prevent nausea and vomiting after your treatment, we encourage you to take your nausea medication.   If you develop nausea and vomiting that is not controlled by your nausea medication, call the clinic.   BELOW ARE SYMPTOMS THAT SHOULD BE REPORTED IMMEDIATELY:  *FEVER GREATER THAN 100.5 F  *CHILLS WITH OR WITHOUT FEVER  NAUSEA AND VOMITING THAT IS NOT CONTROLLED WITH YOUR NAUSEA MEDICATION  *UNUSUAL SHORTNESS OF BREATH  *UNUSUAL BRUISING OR BLEEDING  TENDERNESS IN MOUTH AND THROAT WITH OR WITHOUT PRESENCE OF ULCERS  *URINARY PROBLEMS  *BOWEL PROBLEMS  UNUSUAL RASH Items with * indicate a potential emergency and should be followed up as soon as possible.  Feel free to call the clinic you have any questions or concerns. The clinic phone number is [phone].  Please show the CHEMO ALERT CARD at check-in to the Emergency Department and triage nurse.   

## 2015-03-11 NOTE — Progress Notes (Signed)
Hematology and Oncology Follow Up Visit  [name] [id number] [birth year] 44 y.o. 03/11/2015   Principle Diagnosis:   Recurrent cutaneous follicular B-cell non-Hodgkin's lymphoma  Current Therapy:    Status post cycle  #3 of Gazyva/Treanda     Interim History:  [name] is back for follow-up. She is doing quite well.she really has had very few problems with chemotherapy.  She is feeling better. She has tolerated chemotherapy quite well. The big problem is that she has had mucositis  These typically happen for the first week or so after treatment. We have her on Magic mouth wash. This does help.  She's had no cough. She's had no shortness of breath. She's had no change in bowel or bladder habits.  She still has her monthly cycles. Gravida she's had no leg swelling. She's had no rashes.  Overall, her performance status is ECOG 1.     Medications:  Current outpatient prescriptions:  .  acyclovir (ZOVIRAX) 400 MG tablet, Take 1 tablet (400 mg total) by mouth 2 (two) times daily., Disp: 60 tablet, Rfl: 5 .  dexamethasone (DECADRON) 4 MG tablet, Take 1 tablet (4 mg total) by mouth 2 (two) times daily with a meal. Start the day after chemotherapy for 2 days. Take with food., Disp: 60 tablet, Rfl: 1 .  DPH-Lido-AlHydr-MgHydr-Simeth (FIRST-MOUTHWASH BLM) SUSP, SWISH/SWALLOW 5MLS FOUR TIMES A DAY AS NEEDED FOR PAIN, Disp: , Rfl: 3 .  lisdexamfetamine (VYVANSE) 50 MG capsule, Take 1 capsule (50 mg total) by mouth daily., Disp: 30 capsule, Rfl: 0 .  LORazepam (ATIVAN) 1 MG tablet, Take 1 tablet (1 mg total) by mouth every 8 (eight) hours as needed for anxiety., Disp: 90 tablet, Rfl: 1 .  magic mouthwash w/lidocaine SOLN, , Disp: , Rfl: 3 .  ondansetron (ZOFRAN) 8 MG tablet, Take 1 tablet (8 mg total) by mouth 2 (two) times daily. Start the day after chemo for 2 days. Then take as needed for nausea or vomiting., Disp: 30 tablet, Rfl: 1 .  prochlorperazine (COMPAZINE) 10 MG tablet, Take 1  tablet (10 mg total) by mouth every 6 (six) hours as needed (Nausea or vomiting)., Disp: 30 tablet, Rfl: 1  Allergies: No Known Allergies  Past Medical History, Surgical history, Social history, and Family History were reviewed and updated.  Review of Systems: As above  Physical Exam:  height is 5\' 2"  (1.575 m) and weight is 152 lb (68.947 kg). Her oral temperature is 97.8 F (36.6 C). Her blood pressure is 112/42 and her pulse is 87. Her respiration is 16.   Wt Readings from Last 3 Encounters:  03/11/15 152 lb (68.947 kg)  02/10/15 152 lb (68.947 kg)  02/02/15 153 lb 6.4 oz (69.582 kg)     Well-developed well nourished white female in no obvious distress. Head and neck exam shows no ocular or oral lesions. She has no palpable cervical or supraclavicular lymph nodes. Lungs are clear. Cardiac exam regular rate and rhythm with no murmurs, rubs or bruits. Abdomen is soft. She has good bowel sounds. There is no fluid wave. There is no palpable liver or spleen tip. Back exam shows the wide local excision scar in the mid upper back. No nodularity is noted under the skin. Extremities shows no clubbing, cyanosis or edema. She has good range motion of her joints. Neurological exam shows no focal neurological deficits. Skin exam shows no rashes, ecchymoses or petechia.  Lab Results  Component Value Date   WBC 5.1 03/11/2015  HGB 12.3 03/11/2015   HCT 37.9 03/11/2015   MCV 95 03/11/2015   PLT 277 03/11/2015     Chemistry      Component Value Date/Time   NA 139 03/11/2015 0938   NA 140 10/19/2014 1608   NA 137 01/17/2013 1111   K 4.0 03/11/2015 0938   K 3.7 10/19/2014 1608   K 4.1 01/17/2013 1111   CL 104 03/11/2015 0938   CL 106 01/17/2013 1111   CO2 26 03/11/2015 0938   CO2 23 10/19/2014 1608   CO2 26 01/17/2013 1111   BUN 8 03/11/2015 0938   BUN 9.8 10/19/2014 1608   BUN 13 01/17/2013 1111   CREATININE 0.6 03/11/2015 0938   CREATININE 0.7 10/19/2014 1608   CREATININE 0.6  01/17/2013 1111      Component Value Date/Time   CALCIUM 9.2 03/11/2015 0938   CALCIUM 8.8 10/19/2014 1608   CALCIUM 9.3 01/17/2013 1111   ALKPHOS 131* 03/11/2015 0938   ALKPHOS 134 10/19/2014 1608   ALKPHOS 89 01/17/2013 1111   AST 35 03/11/2015 0938   AST 14 10/19/2014 1608   AST 11 01/17/2013 1111   ALT 39 03/11/2015 0938   ALT 15 10/19/2014 1608   ALT 16 01/17/2013 1111   BILITOT 0.50 03/11/2015 0938   BILITOT 0.30 10/19/2014 1608   BILITOT 0.6 01/17/2013 1111         Impression and Plan: [name] is 44 year old white female. She has recurrence of her cutaneous B-cell lymphoma. This is her third recurrence. We are treating her with, with what I hope to be, curative therapy with Gazyva/Treanda.  We will go ahead with her fourth cycle treatment. After this cycle, I will repeat a PET scan. I think everything looks okay, then we can probably just get her onto maintenance therapy with Gazyva every 2 months. I think this would be very reasonable.  We will plan to get her back in 4 weeks.   Erin Napoleon, MD 10/13/201610:54 AM

## 2015-03-12 ENCOUNTER — Ambulatory Visit (HOSPITAL_BASED_OUTPATIENT_CLINIC_OR_DEPARTMENT_OTHER): Payer: BLUE CROSS/BLUE SHIELD

## 2015-03-12 VITALS — BP 105/62 | HR 66 | Temp 98.4°F | Resp 18

## 2015-03-12 DIAGNOSIS — Z5111 Encounter for antineoplastic chemotherapy: Secondary | ICD-10-CM

## 2015-03-12 DIAGNOSIS — C8519 Unspecified B-cell lymphoma, extranodal and solid organ sites: Secondary | ICD-10-CM

## 2015-03-12 DIAGNOSIS — C8290 Follicular lymphoma, unspecified, unspecified site: Secondary | ICD-10-CM

## 2015-03-12 LAB — LUTEINIZING HORMONE: LH: 4.1 m[IU]/mL

## 2015-03-12 LAB — FOLLICLE STIMULATING HORMONE: FSH: 8.3 m[IU]/mL

## 2015-03-12 MED ORDER — SODIUM CHLORIDE 0.9 % IV SOLN
81.0000 mg/m2 | Freq: Once | INTRAVENOUS | Status: AC
  Administered 2015-03-12: 150 mg via INTRAVENOUS
  Filled 2015-03-12: qty 6

## 2015-03-12 MED ORDER — SODIUM CHLORIDE 0.9 % IV SOLN
Freq: Once | INTRAVENOUS | Status: AC
  Administered 2015-03-12: 10:00:00 via INTRAVENOUS

## 2015-03-12 MED ORDER — SODIUM CHLORIDE 0.9 % IV SOLN
Freq: Once | INTRAVENOUS | Status: AC
  Administered 2015-03-12: 10:00:00 via INTRAVENOUS
  Filled 2015-03-12: qty 4

## 2015-03-12 NOTE — Patient Instructions (Signed)

## 2015-03-17 LAB — ESTRADIOL, ULTRA SENS: ESTRADIOL, ULTRA SENSITIVE: 74 pg/mL

## 2015-03-22 ENCOUNTER — Ambulatory Visit (HOSPITAL_COMMUNITY): Payer: BLUE CROSS/BLUE SHIELD

## 2015-03-26 ENCOUNTER — Encounter (HOSPITAL_COMMUNITY)
Admission: RE | Admit: 2015-03-26 | Discharge: 2015-03-26 | Disposition: A | Discharge status: Home / Self Care | Payer: BLUE CROSS/BLUE SHIELD | Source: Ambulatory Visit | Attending: Hematology & Oncology | Admitting: Hematology & Oncology

## 2015-03-26 DIAGNOSIS — C8580 Other specified types of non-Hodgkin lymphoma, unspecified site: Secondary | ICD-10-CM | POA: Diagnosis not present

## 2015-03-26 DIAGNOSIS — C8519 Unspecified B-cell lymphoma, extranodal and solid organ sites: Secondary | ICD-10-CM

## 2015-03-26 LAB — GLUCOSE, CAPILLARY: Glucose-Capillary: 87 mg/dL (ref 65–99)

## 2015-03-26 MED ORDER — FLUDEOXYGLUCOSE F - 18 (FDG) INJECTION: 8.3000 | Freq: Once | INTRAVENOUS | Status: DC | PRN

## 2015-03-29 ENCOUNTER — Telehealth: Payer: Self-pay | Admitting: Nurse Practitioner

## 2015-03-29 NOTE — Telephone Encounter (Addendum)
LVM on pt's personal machine and informed her to contact office for further information.   ----- Message from Volanda Napoleon, MD sent at 03/29/2015  6:59 AM EDT ----- Call - PET scan is now normal!!!!  No evidence of active lymphoma!!!  Laurey Arrow

## 2015-04-08 ENCOUNTER — Other Ambulatory Visit: Payer: Self-pay | Admitting: Family Medicine

## 2015-04-08 MED ORDER — LISDEXAMFETAMINE DIMESYLATE 50 MG PO CAPS: 50.0000 mg | ORAL_CAPSULE | Freq: Every day | ORAL | Status: DC

## 2015-04-08 NOTE — Telephone Encounter (Signed)
I'll print rx, but ask her to try to come in for routine ADHD f/u in the next 1-2 mo.-thx

## 2015-04-08 NOTE — Telephone Encounter (Signed)
Pt advised and voiced understanding.  Apt made for 04/30/15 at 2:15pm.

## 2015-04-08 NOTE — Telephone Encounter (Signed)
Left message for pt to call back  °

## 2015-04-08 NOTE — Telephone Encounter (Signed)
RF request for Vyvanse LOV: 09/16/14 Next ov: None Last written: 03/03/15 #30 w/ 0RF  Please advise. Thanks.

## 2015-04-13 ENCOUNTER — Other Ambulatory Visit: Payer: BLUE CROSS/BLUE SHIELD

## 2015-04-13 ENCOUNTER — Ambulatory Visit: Payer: BLUE CROSS/BLUE SHIELD

## 2015-04-13 ENCOUNTER — Ambulatory Visit: Payer: BLUE CROSS/BLUE SHIELD | Admitting: Hematology & Oncology

## 2015-04-16 ENCOUNTER — Ambulatory Visit (HOSPITAL_BASED_OUTPATIENT_CLINIC_OR_DEPARTMENT_OTHER): Payer: BLUE CROSS/BLUE SHIELD | Admitting: Hematology & Oncology

## 2015-04-16 ENCOUNTER — Ambulatory Visit (HOSPITAL_BASED_OUTPATIENT_CLINIC_OR_DEPARTMENT_OTHER): Payer: BLUE CROSS/BLUE SHIELD

## 2015-04-16 ENCOUNTER — Other Ambulatory Visit (HOSPITAL_BASED_OUTPATIENT_CLINIC_OR_DEPARTMENT_OTHER): Payer: BLUE CROSS/BLUE SHIELD

## 2015-04-16 VITALS — BP 98/45 | HR 74 | Temp 97.8°F | Resp 18

## 2015-04-16 DIAGNOSIS — C8519 Unspecified B-cell lymphoma, extranodal and solid organ sites: Secondary | ICD-10-CM

## 2015-04-16 DIAGNOSIS — T387X5A Adverse effect of androgens and anabolic congeners, initial encounter: Secondary | ICD-10-CM

## 2015-04-16 DIAGNOSIS — C8580 Other specified types of non-Hodgkin lymphoma, unspecified site: Secondary | ICD-10-CM

## 2015-04-16 DIAGNOSIS — Z5112 Encounter for antineoplastic immunotherapy: Secondary | ICD-10-CM

## 2015-04-16 DIAGNOSIS — M818 Other osteoporosis without current pathological fracture: Secondary | ICD-10-CM

## 2015-04-16 LAB — CBC WITH DIFFERENTIAL (CANCER CENTER ONLY)
BASO#: 0.1 10*3/uL (ref 0.0–0.2)
BASO%: 1.8 % (ref 0.0–2.0)
EOS ABS: 0.5 10*3/uL (ref 0.0–0.5)
EOS%: 7.6 % — ABNORMAL HIGH (ref 0.0–7.0)
HCT: 35.8 % (ref 34.8–46.6)
HGB: 11.6 g/dL (ref 11.6–15.9)
LYMPH#: 0.7 10*3/uL — ABNORMAL LOW (ref 0.9–3.3)
LYMPH%: 11.9 % — AB (ref 14.0–48.0)
MCH: 30.5 pg (ref 26.0–34.0)
MCHC: 32.4 g/dL (ref 32.0–36.0)
MCV: 94 fL (ref 81–101)
MONO#: 0.6 10*3/uL (ref 0.1–0.9)
MONO%: 9.3 % (ref 0.0–13.0)
NEUT#: 4.3 10*3/uL (ref 1.5–6.5)
NEUT%: 69.4 % (ref 39.6–80.0)
PLATELETS: 225 10*3/uL (ref 145–400)
RBC: 3.8 10*6/uL (ref 3.70–5.32)
RDW: 12.9 % (ref 11.1–15.7)
WBC: 6.2 10*3/uL (ref 3.9–10.0)

## 2015-04-16 LAB — CMP (CANCER CENTER ONLY)
ALT(SGPT): 32 U/L (ref 10–47)
AST: 26 U/L (ref 11–38)
Albumin: 3.6 g/dL (ref 3.3–5.5)
Alkaline Phosphatase: 113 U/L — ABNORMAL HIGH (ref 26–84)
BUN: 8 mg/dL (ref 7–22)
CHLORIDE: 105 meq/L (ref 98–108)
CO2: 28 meq/L (ref 18–33)
Calcium: 9.3 mg/dL (ref 8.0–10.3)
Creat: 0.6 mg/dl (ref 0.6–1.2)
GLUCOSE: 95 mg/dL (ref 73–118)
POTASSIUM: 3.9 meq/L (ref 3.3–4.7)
Sodium: 142 mEq/L (ref 128–145)
Total Bilirubin: 0.5 mg/dl (ref 0.20–1.60)
Total Protein: 6.8 g/dL (ref 6.4–8.1)

## 2015-04-16 LAB — LACTATE DEHYDROGENASE (CC13): LDH: 239 U/L (ref 125–245)

## 2015-04-16 MED ORDER — SODIUM CHLORIDE 0.9 % IV SOLN
1000.0000 mg | Freq: Once | INTRAVENOUS | Status: AC
  Administered 2015-04-16: 1000 mg via INTRAVENOUS
  Filled 2015-04-16: qty 40

## 2015-04-16 MED ORDER — ACETAMINOPHEN 325 MG PO TABS
ORAL_TABLET | ORAL | Status: AC
  Filled 2015-04-16: qty 2

## 2015-04-16 MED ORDER — SODIUM CHLORIDE 0.9 % IV SOLN
Freq: Once | INTRAVENOUS | Status: AC
  Administered 2015-04-16: 13:00:00 via INTRAVENOUS

## 2015-04-16 MED ORDER — DIPHENHYDRAMINE HCL 25 MG PO CAPS
ORAL_CAPSULE | ORAL | Status: AC
  Filled 2015-04-16: qty 2

## 2015-04-16 MED ORDER — DIPHENHYDRAMINE HCL 25 MG PO CAPS
50.0000 mg | ORAL_CAPSULE | Freq: Once | ORAL | Status: AC
  Administered 2015-04-16: 50 mg via ORAL

## 2015-04-16 MED ORDER — ACETAMINOPHEN 325 MG PO TABS
650.0000 mg | ORAL_TABLET | Freq: Once | ORAL | Status: AC
  Administered 2015-04-16: 650 mg via ORAL

## 2015-04-16 NOTE — Patient Instructions (Signed)
[facility] Discharge Instructions for Patients Receiving Chemotherapy  Today you received the following chemotherapy agents Gazyva  To help prevent nausea and vomiting after your treatment, we encourage you to take your nausea medication    If you develop nausea and vomiting that is not controlled by your nausea medication, call the clinic.   BELOW ARE SYMPTOMS THAT SHOULD BE REPORTED IMMEDIATELY:  *FEVER GREATER THAN 100.5 F  *CHILLS WITH OR WITHOUT FEVER  NAUSEA AND VOMITING THAT IS NOT CONTROLLED WITH YOUR NAUSEA MEDICATION  *UNUSUAL SHORTNESS OF BREATH  *UNUSUAL BRUISING OR BLEEDING  TENDERNESS IN MOUTH AND THROAT WITH OR WITHOUT PRESENCE OF ULCERS  *URINARY PROBLEMS  *BOWEL PROBLEMS  UNUSUAL RASH Items with * indicate a potential emergency and should be followed up as soon as possible.  Feel free to call the clinic you have any questions or concerns. The clinic phone number is [phone].  Please show the CHEMO ALERT CARD at check-in to the Emergency Department and triage nurse.   

## 2015-04-16 NOTE — Progress Notes (Signed)
Hematology and Oncology Follow Up Visit  [name] LA:[id number] 1971-02-18 44 y.o. 04/16/2015   Principle Diagnosis:   Recurrent cutaneous follicular B-cell non-Hodgkin's lymphoma  Current Therapy:    Status post cycle  #4 of Gazyva/Treanda     Interim History:  [name] is back for follow-up. She is doing quite well.she really has had very few problems with chemotherapy.  We did go ahead and repeat a PET scan on her at her her fourth cycle. Thankfully, the PET scan showed that she was in remission.  I think that we probably can just treat her with [name] now.  She has had a problem with fevers. She's had little bit of a cough. She's had no nausea or vomiting. His been no problems with her monthly cycles. She's had no change in bowel or bladder habits. She's had no rashes. She's had no leg swelling.  Overall, her performance status is ECOG 1.     Medications:  Current outpatient prescriptions:  .  acyclovir (ZOVIRAX) 400 MG tablet, Take 1 tablet (400 mg total) by mouth 2 (two) times daily., Disp: 60 tablet, Rfl: 5 .  dexamethasone (DECADRON) 4 MG tablet, Take 1 tablet (4 mg total) by mouth 2 (two) times daily with a meal. Start the day after chemotherapy for 2 days. Take with food., Disp: 60 tablet, Rfl: 1 .  DPH-Lido-AlHydr-MgHydr-Simeth (FIRST-MOUTHWASH BLM) SUSP, SWISH/SWALLOW 5MLS FOUR TIMES A DAY AS NEEDED FOR PAIN, Disp: , Rfl: 3 .  lisdexamfetamine (VYVANSE) 50 MG capsule, Take 1 capsule (50 mg total) by mouth daily., Disp: 30 capsule, Rfl: 0 .  LORazepam (ATIVAN) 1 MG tablet, Take 1 tablet (1 mg total) by mouth every 8 (eight) hours as needed for anxiety., Disp: 90 tablet, Rfl: 1 .  magic mouthwash w/lidocaine SOLN, Take 5 mLs by mouth 4 (four) times daily., Disp: 600 mL, Rfl: 3 .  ondansetron (ZOFRAN) 8 MG tablet, Take 1 tablet (8 mg total) by mouth 2 (two) times daily. Start the day after chemo for 2 days. Then take as needed for nausea or vomiting., Disp: 30 tablet,  Rfl: 5 .  prochlorperazine (COMPAZINE) 10 MG tablet, Take 1 tablet (10 mg total) by mouth every 6 (six) hours as needed (Nausea or vomiting)., Disp: 30 tablet, Rfl: 5  Allergies: No Known Allergies  Past Medical History, Surgical history, Social history, and Family History were reviewed and updated.  Review of Systems: As above  Physical Exam:  vitals were not taken for this visit.  Wt Readings from Last 3 Encounters:  03/11/15 152 lb (68.947 kg)  02/10/15 152 lb (68.947 kg)  02/02/15 153 lb 6.4 oz (69.582 kg)     Well-developed well nourished white female in no obvious distress. Head and neck exam shows no ocular or oral lesions. She has no palpable cervical or supraclavicular lymph nodes. Lungs are clear. Cardiac exam regular rate and rhythm with no murmurs, rubs or bruits. Abdomen is soft. She has good bowel sounds. There is no fluid wave. There is no palpable liver or spleen tip. Back exam shows the wide local excision scar in the mid upper back. No nodularity is noted under the skin. Extremities shows no clubbing, cyanosis or edema. She has good range motion of her joints. Neurological exam shows no focal neurological deficits. Skin exam shows no rashes, ecchymoses or petechia.  Lab Results  Component Value Date   WBC 5.1 03/11/2015   HGB 12.3 03/11/2015   HCT 37.9 03/11/2015   MCV 95  03/11/2015   PLT 277 03/11/2015     Chemistry      Component Value Date/Time   NA 139 03/11/2015 0938   NA 140 10/19/2014 1608   NA 137 01/17/2013 1111   K 4.0 03/11/2015 0938   K 3.7 10/19/2014 1608   K 4.1 01/17/2013 1111   CL 104 03/11/2015 0938   CL 106 01/17/2013 1111   CO2 26 03/11/2015 0938   CO2 23 10/19/2014 1608   CO2 26 01/17/2013 1111   BUN 8 03/11/2015 0938   BUN 9.8 10/19/2014 1608   BUN 13 01/17/2013 1111   CREATININE 0.6 03/11/2015 0938   CREATININE 0.7 10/19/2014 1608   CREATININE 0.6 01/17/2013 1111      Component Value Date/Time   CALCIUM 9.2 03/11/2015 0938    CALCIUM 8.8 10/19/2014 1608   CALCIUM 9.3 01/17/2013 1111   ALKPHOS 131* 03/11/2015 0938   ALKPHOS 134 10/19/2014 1608   ALKPHOS 89 01/17/2013 1111   AST 35 03/11/2015 0938   AST 14 10/19/2014 1608   AST 11 01/17/2013 1111   ALT 39 03/11/2015 0938   ALT 15 10/19/2014 1608   ALT 16 01/17/2013 1111   BILITOT 0.50 03/11/2015 0938   BILITOT 0.30 10/19/2014 1608   BILITOT 0.6 01/17/2013 1111         Impression and Plan: [name] is 44 year old white female. She has recurrence of her cutaneous B-cell lymphoma. This is her third recurrence. We are treating her with, with what I hope to be, curative therapy with Gazyva/Treanda.  Again, she is in remission. She got into remission after 2 cycles of therapy.  I really think that 4 cycles is adequate.  We will now does have her on maintenance Gazyva. I think every 2 months of treatment with Gazyva in the maintenance setting would be reasonable.  She's doing great job trying to help her husband. He has metastatic gastroesophageal cancer.  I will plan to see her back in 2 months.  Erin Napoleon, MD 11/18/201611:21 AM

## 2015-04-17 LAB — VITAMIN D 25 HYDROXY (VIT D DEFICIENCY, FRACTURES): VIT D 25 HYDROXY: 12 ng/mL — AB (ref 30–100)

## 2015-04-19 ENCOUNTER — Telehealth: Payer: Self-pay | Admitting: *Deleted

## 2015-04-19 DIAGNOSIS — E559 Vitamin D deficiency, unspecified: Secondary | ICD-10-CM

## 2015-04-19 MED ORDER — ERGOCALCIFEROL 1.25 MG (50000 UT) PO CAPS: 50000.0000 [IU] | ORAL_CAPSULE | ORAL | Status: DC

## 2015-04-19 NOTE — Telephone Encounter (Addendum)
Message left on personal voice mail. Prescription sent,.  ----- Message from Volanda Napoleon, MD sent at 04/19/2015  6:13 AM EST ----- Call - vit D level is very low!!  She needs 50000 units q week.  Please call this in. pete

## 2015-04-30 ENCOUNTER — Encounter: Payer: Self-pay | Admitting: Family Medicine

## 2015-04-30 ENCOUNTER — Other Ambulatory Visit: Payer: Self-pay | Admitting: Hematology & Oncology

## 2015-04-30 ENCOUNTER — Ambulatory Visit (INDEPENDENT_AMBULATORY_CARE_PROVIDER_SITE_OTHER): Payer: BLUE CROSS/BLUE SHIELD | Admitting: Family Medicine

## 2015-04-30 VITALS — BP 110/74 | HR 94 | Temp 98.6°F | Resp 16 | Ht 62.0 in | Wt 146.0 lb

## 2015-04-30 DIAGNOSIS — G4700 Insomnia, unspecified: Secondary | ICD-10-CM

## 2015-04-30 DIAGNOSIS — F900 Attention-deficit hyperactivity disorder, predominantly inattentive type: Secondary | ICD-10-CM | POA: Diagnosis not present

## 2015-04-30 DIAGNOSIS — F909 Attention-deficit hyperactivity disorder, unspecified type: Secondary | ICD-10-CM

## 2015-04-30 MED ORDER — LISDEXAMFETAMINE DIMESYLATE 30 MG PO CAPS: 30.0000 mg | ORAL_CAPSULE | Freq: Every day | ORAL | Status: DC

## 2015-04-30 MED ORDER — TRAZODONE HCL 50 MG PO TABS: ORAL_TABLET | ORAL | Status: DC

## 2015-04-30 NOTE — Progress Notes (Signed)
OFFICE NOTE  04/30/2015  CC:  Chief Complaint  Patient presents with  . Follow-up    Pt is not fasting.      HPI: Patient is a 44 y.o. Caucasian female who is here for f/u adult ADHD, hx of anxiety. I last saw her about 7 mo ago.  She has been very busy with dealing with recurrence of her cutaneous B cell lymphoma and also with her husband's advanced gastroesophageal cancer.  Of note, her cancer is now in remission and her oncologist changed her regimen recently to [facility] q 2 mo maintenance therapy.  Feels like anxiety increased lately and asks for vyvanse dose to be decreased back to 30mg  qd.  She thinks the 50mg  dose has made her feel a bit too tense and she notes herself clinching her muscles more and sucking on a tooth in her mouth constantly.   Appetite comes and goes: to do with stress and also with recent chemo.  Says she usually used ativan to help sleep, not prn anxiety, but says she recently stopped using this for sleep b/c she woke up with HA each morning whenever she took it.     Pertinent PMH:  Past medical, surgical, social, and family history reviewed and no changes are noted since last office visit.  MEDS:  Outpatient Prescriptions Prior to Visit  Medication Sig Dispense Refill  . acyclovir (ZOVIRAX) 400 MG tablet Take 1 tablet (400 mg total) by mouth 2 (two) times daily. 60 tablet 5  . dexamethasone (DECADRON) 4 MG tablet Take 1 tablet (4 mg total) by mouth 2 (two) times daily with a meal. Start the day after chemotherapy for 2 days. Take with food. 60 tablet 1  . DPH-Lido-AlHydr-MgHydr-Simeth (FIRST-MOUTHWASH BLM) SUSP TAKE 15ML BY MOUTH 4 TIMES A DAY AS NEEDED FOR MOUTH PAIN 480 mL 3  . ergocalciferol (VITAMIN D2) 50000 UNITS capsule Take 1 capsule (50,000 Units total) by mouth once a week. 12 capsule 3  . magic mouthwash w/lidocaine SOLN Take 5 mLs by mouth 4 (four) times daily. 600 mL 3  . ondansetron (ZOFRAN) 8 MG tablet Take 1 tablet (8 mg total) by mouth 2  (two) times daily. Start the day after chemo for 2 days. Then take as needed for nausea or vomiting. 30 tablet 5  . prochlorperazine (COMPAZINE) 10 MG tablet Take 1 tablet (10 mg total) by mouth every 6 (six) hours as needed (Nausea or vomiting). 30 tablet 5  . lisdexamfetamine (VYVANSE) 50 MG capsule Take 1 capsule (50 mg total) by mouth daily. 30 capsule 0  . LORazepam (ATIVAN) 1 MG tablet Take 1 tablet (1 mg total) by mouth every 8 (eight) hours as needed for anxiety. 90 tablet 1   No facility-administered medications prior to visit.    PE: Blood pressure 110/74, pulse 94, temperature 98.6 F (37 C), temperature source Oral, resp. rate 16, height 5\' 2"  (1.575 m), weight 146 lb (66.225 kg), SpO2 99 %. Wt Readings from Last 2 Encounters:  04/30/15 146 lb (66.225 kg)  03/11/15 152 lb (68.947 kg)   Gen: alert, oriented x 4, affect pleasant.  Lucid thinking and conversation noted. HEENT: PERRLA, EOMI.   Neck: no LAD, mass, or thyromegaly. CV: RRR, no m/r/g LUNGS: CTA bilat, nonlabored. NEURO: no tremor or tics noted on observation.  Coordination intact. CN 2-12 grossly intact bilaterally, strength 5/5 in all extremeties.  No ataxia.   IMPRESSION AND PLAN:  1) Adult ADHD: responds to vyvanse well but dose too high  at 50 mg qd---causing increased tension/anxiety. Decrease dose to 30mg  qd, gave Rx for 30d supply and she'll call in about a month to report how she responded and we'll dose/rx appropriately depending on what she says.  2) Insomnia: she was using ativan prn for this and it helped but she awoke every morning with a nagging HA for a few hours so she has stopped using it.  No other rx or OTC sleep aid has been tried. Will do trial of trazodone 50mg , 1-2 tabs po qhs prn. Therapeutic expectations and side effect profile of medication discussed today.  Patient's questions answered.  An After Visit Summary was printed and given to the patient.  FOLLOW UP: 4 mo

## 2015-04-30 NOTE — Progress Notes (Signed)
Pre visit review using our clinic review tool, if applicable. No additional management support is needed unless otherwise documented below in the visit note. 

## 2015-05-04 ENCOUNTER — Telehealth: Payer: Self-pay | Admitting: Family Medicine

## 2015-05-04 NOTE — Telephone Encounter (Signed)
Patient cannot find her Rxs. Did she leave them here at the office? She also mentioned there was a message on husband's phone. She would like to know what that is about.

## 2015-05-04 NOTE — Telephone Encounter (Signed)
Advised pt that we do not have Rx here.

## 2015-05-06 ENCOUNTER — Other Ambulatory Visit: Payer: BLUE CROSS/BLUE SHIELD

## 2015-05-06 ENCOUNTER — Ambulatory Visit: Payer: BLUE CROSS/BLUE SHIELD | Admitting: Hematology & Oncology

## 2015-05-06 ENCOUNTER — Ambulatory Visit: Payer: BLUE CROSS/BLUE SHIELD

## 2015-06-07 ENCOUNTER — Other Ambulatory Visit: Payer: BLUE CROSS/BLUE SHIELD

## 2015-06-07 ENCOUNTER — Ambulatory Visit: Payer: BLUE CROSS/BLUE SHIELD

## 2015-06-07 ENCOUNTER — Ambulatory Visit: Payer: BLUE CROSS/BLUE SHIELD | Admitting: Hematology & Oncology

## 2015-06-08 ENCOUNTER — Other Ambulatory Visit: Payer: BLUE CROSS/BLUE SHIELD

## 2015-06-08 ENCOUNTER — Ambulatory Visit: Payer: BLUE CROSS/BLUE SHIELD | Admitting: Hematology & Oncology

## 2015-06-08 ENCOUNTER — Ambulatory Visit: Payer: BLUE CROSS/BLUE SHIELD

## 2015-06-10 ENCOUNTER — Other Ambulatory Visit (HOSPITAL_BASED_OUTPATIENT_CLINIC_OR_DEPARTMENT_OTHER): Payer: BLUE CROSS/BLUE SHIELD

## 2015-06-10 ENCOUNTER — Encounter: Payer: Self-pay | Admitting: Hematology & Oncology

## 2015-06-10 ENCOUNTER — Ambulatory Visit (HOSPITAL_BASED_OUTPATIENT_CLINIC_OR_DEPARTMENT_OTHER): Payer: BLUE CROSS/BLUE SHIELD | Admitting: Hematology & Oncology

## 2015-06-10 ENCOUNTER — Ambulatory Visit (HOSPITAL_BASED_OUTPATIENT_CLINIC_OR_DEPARTMENT_OTHER): Payer: BLUE CROSS/BLUE SHIELD

## 2015-06-10 VITALS — BP 112/46 | HR 85 | Temp 98.1°F | Resp 16 | Ht 62.0 in

## 2015-06-10 VITALS — BP 95/42 | HR 91 | Temp 97.9°F | Resp 18

## 2015-06-10 DIAGNOSIS — C8519 Unspecified B-cell lymphoma, extranodal and solid organ sites: Secondary | ICD-10-CM

## 2015-06-10 DIAGNOSIS — Z5112 Encounter for antineoplastic immunotherapy: Secondary | ICD-10-CM | POA: Diagnosis not present

## 2015-06-10 DIAGNOSIS — C8580 Other specified types of non-Hodgkin lymphoma, unspecified site: Secondary | ICD-10-CM

## 2015-06-10 LAB — CBC WITH DIFFERENTIAL (CANCER CENTER ONLY)
BASO#: 0.1 10*3/uL (ref 0.0–0.2)
BASO%: 2.6 % — AB (ref 0.0–2.0)
EOS%: 3.7 % (ref 0.0–7.0)
Eosinophils Absolute: 0.2 10*3/uL (ref 0.0–0.5)
HEMATOCRIT: 38.3 % (ref 34.8–46.6)
HGB: 12.7 g/dL (ref 11.6–15.9)
LYMPH#: 0.6 10*3/uL — AB (ref 0.9–3.3)
LYMPH%: 10.5 % — AB (ref 14.0–48.0)
MCH: 30 pg (ref 26.0–34.0)
MCHC: 33.2 g/dL (ref 32.0–36.0)
MCV: 91 fL (ref 81–101)
MONO#: 0.5 10*3/uL (ref 0.1–0.9)
MONO%: 8.5 % (ref 0.0–13.0)
NEUT#: 4.1 10*3/uL (ref 1.5–6.5)
NEUT%: 74.7 % (ref 39.6–80.0)
PLATELETS: 257 10*3/uL (ref 145–400)
RBC: 4.23 10*6/uL (ref 3.70–5.32)
RDW: 12.9 % (ref 11.1–15.7)
WBC: 5.4 10*3/uL (ref 3.9–10.0)

## 2015-06-10 LAB — CMP (CANCER CENTER ONLY)
ALT(SGPT): 22 U/L (ref 10–47)
AST: 21 U/L (ref 11–38)
Albumin: 4 g/dL (ref 3.3–5.5)
Alkaline Phosphatase: 102 U/L — ABNORMAL HIGH (ref 26–84)
BILIRUBIN TOTAL: 0.7 mg/dL (ref 0.20–1.60)
BUN: 7 mg/dL (ref 7–22)
CO2: 26 meq/L (ref 18–33)
CREATININE: 0.5 mg/dL — AB (ref 0.6–1.2)
Calcium: 9.1 mg/dL (ref 8.0–10.3)
Chloride: 105 mEq/L (ref 98–108)
GLUCOSE: 91 mg/dL (ref 73–118)
Potassium: 3.6 mEq/L (ref 3.3–4.7)
SODIUM: 142 meq/L (ref 128–145)
Total Protein: 6.7 g/dL (ref 6.4–8.1)

## 2015-06-10 LAB — LACTATE DEHYDROGENASE: LDH: 214 U/L (ref 125–245)

## 2015-06-10 MED ORDER — SODIUM CHLORIDE 0.9 % IV SOLN
1000.0000 mg | Freq: Once | INTRAVENOUS | Status: AC
  Administered 2015-06-10: 1000 mg via INTRAVENOUS
  Filled 2015-06-10: qty 40

## 2015-06-10 MED ORDER — DIPHENHYDRAMINE HCL 25 MG PO CAPS
50.0000 mg | ORAL_CAPSULE | Freq: Once | ORAL | Status: AC
  Administered 2015-06-10: 50 mg via ORAL

## 2015-06-10 MED ORDER — DIPHENHYDRAMINE HCL 25 MG PO CAPS
ORAL_CAPSULE | ORAL | Status: AC
  Filled 2015-06-10: qty 2

## 2015-06-10 MED ORDER — SODIUM CHLORIDE 0.9 % IV SOLN
Freq: Once | INTRAVENOUS | Status: AC
  Administered 2015-06-10: 12:00:00 via INTRAVENOUS

## 2015-06-10 MED ORDER — ACETAMINOPHEN 325 MG PO TABS
650.0000 mg | ORAL_TABLET | Freq: Once | ORAL | Status: AC
Start: 2015-06-10 — End: 2015-06-10
  Administered 2015-06-10: 650 mg via ORAL

## 2015-06-10 MED ORDER — ACETAMINOPHEN 325 MG PO TABS
ORAL_TABLET | ORAL | Status: AC
  Filled 2015-06-10: qty 2

## 2015-06-10 NOTE — Progress Notes (Signed)
Hematology and Oncology Follow Up Visit  [name] ON:[id number] 1971-01-16 44 y.o. 06/10/2015   Principle Diagnosis:   Recurrent cutaneous follicular B-cell non-Hodgkin's lymphoma  Current Therapy:    Status post cycle  #4 of Gazyva/Treanda  Maintenance Gazyva-to start cycle 2 today.     Interim History:  [name] is back for follow-up. Overall, she is doing okay. She is under quite a bit of stress because of her husband's cancer. He just is not doing as well as a thought he would. He keeps having complications.  She is getting a lot of stress from his side of the family. This also is causing her problems.  She has 2 kids. She is trying to make sure that they are doing well in school and understand what is going on with their father.  She had 4 cycles of chemotherapy. She tolerated this well. She completed this back in October.   She's had no fever. She's had no rashes. She's had no change in bowel or bladder habits. Her appetite has been good. She's had no cough. She's had no nausea or vomiting.   Overall, her performance status is ECOG 0    Medications:  Current outpatient prescriptions:  .  acyclovir (ZOVIRAX) 400 MG tablet, Take 1 tablet (400 mg total) by mouth 2 (two) times daily., Disp: 60 tablet, Rfl: 5 .  dexamethasone (DECADRON) 4 MG tablet, Take 1 tablet (4 mg total) by mouth 2 (two) times daily with a meal. Start the day after chemotherapy for 2 days. Take with food., Disp: 60 tablet, Rfl: 1 .  DPH-Lido-AlHydr-MgHydr-Simeth (FIRST-MOUTHWASH BLM) SUSP, TAKE 15ML BY MOUTH 4 TIMES A DAY AS NEEDED FOR MOUTH PAIN, Disp: 480 mL, Rfl: 3 .  ergocalciferol (VITAMIN D2) 50000 UNITS capsule, Take 1 capsule (50,000 Units total) by mouth once a week., Disp: 12 capsule, Rfl: 3 .  lisdexamfetamine (VYVANSE) 30 MG capsule, Take 1 capsule (30 mg total) by mouth daily., Disp: 30 capsule, Rfl: 0 .  magic mouthwash w/lidocaine SOLN, Take 5 mLs by mouth 4 (four) times daily., Disp: 600  mL, Rfl: 3 .  ondansetron (ZOFRAN) 8 MG tablet, Take 1 tablet (8 mg total) by mouth 2 (two) times daily. Start the day after chemo for 2 days. Then take as needed for nausea or vomiting., Disp: 30 tablet, Rfl: 5 .  prochlorperazine (COMPAZINE) 10 MG tablet, Take 1 tablet (10 mg total) by mouth every 6 (six) hours as needed (Nausea or vomiting)., Disp: 30 tablet, Rfl: 5 .  traZODone (DESYREL) 50 MG tablet, 1-2 tabs po qhs prn insomnia, Disp: 60 tablet, Rfl: 6 No current facility-administered medications for this visit.  Facility-Administered Medications Ordered in Other Visits:  .  0.9 %  sodium chloride infusion, , Intravenous, Once, Volanda Napoleon, MD .  obinutuzumab (GAZYVA) 1,000 mg in sodium chloride 0.9 % 250 mL (3.4483 mg/mL) chemo infusion, 1,000 mg, Intravenous, Once, Volanda Napoleon, MD  Allergies: No Known Allergies  Past Medical History, Surgical history, Social history, and Family History were reviewed and updated.  Review of Systems: As above  Physical Exam:  height is 5\' 2"  (1.575 m). Her oral temperature is 98.1 F (36.7 C). Her blood pressure is 112/46 and her pulse is 85. Her respiration is 16.   Wt Readings from Last 3 Encounters:  04/30/15 146 lb (66.225 kg)  03/11/15 152 lb (68.947 kg)  02/10/15 152 lb (68.947 kg)     Well-developed well nourished white female in no obvious  distress. Head and neck exam shows no ocular or oral lesions. She has no palpable cervical or supraclavicular lymph nodes. Lungs are clear. Cardiac exam regular rate and rhythm with no murmurs, rubs or bruits. Abdomen is soft. She has good bowel sounds. There is no fluid wave. There is no palpable liver or spleen tip. Back exam shows the wide local excision scar in the mid upper back. No nodularity is noted under the skin. Extremities shows no clubbing, cyanosis or edema. She has good range motion of her joints. Neurological exam shows no focal neurological deficits. Skin exam shows no rashes,  ecchymoses or petechia.  Lab Results  Component Value Date   WBC 5.4 06/10/2015   HGB 12.7 06/10/2015   HCT 38.3 06/10/2015   MCV 91 06/10/2015   PLT 257 06/10/2015     Chemistry      Component Value Date/Time   NA 142 06/10/2015 1004   NA 140 10/19/2014 1608   NA 137 01/17/2013 1111   K 3.6 06/10/2015 1004   K 3.7 10/19/2014 1608   K 4.1 01/17/2013 1111   CL 105 06/10/2015 1004   CL 106 01/17/2013 1111   CO2 26 06/10/2015 1004   CO2 23 10/19/2014 1608   CO2 26 01/17/2013 1111   BUN 7 06/10/2015 1004   BUN 9.8 10/19/2014 1608   BUN 13 01/17/2013 1111   CREATININE 0.5* 06/10/2015 1004   CREATININE 0.7 10/19/2014 1608   CREATININE 0.6 01/17/2013 1111      Component Value Date/Time   CALCIUM 9.1 06/10/2015 1004   CALCIUM 8.8 10/19/2014 1608   CALCIUM 9.3 01/17/2013 1111   ALKPHOS 102* 06/10/2015 1004   ALKPHOS 134 10/19/2014 1608   ALKPHOS 89 01/17/2013 1111   AST 21 06/10/2015 1004   AST 14 10/19/2014 1608   AST 11 01/17/2013 1111   ALT 22 06/10/2015 1004   ALT 15 10/19/2014 1608   ALT 16 01/17/2013 1111   BILITOT 0.70 06/10/2015 1004   BILITOT 0.30 10/19/2014 1608   BILITOT 0.6 01/17/2013 1111         Impression and Plan: [name] is 45 year old white female. She has recurrence of her cutaneous B-cell lymphoma. This is her third recurrence. We are treating her with, with what I hope to be, curative therapy with Gazyva/Treanda.  Again, she is in remission. She got into remission after 2 cycles of therapy.  I really think that 4 cycles is adequate.  We will now continue her on maintenance Gazyva. This is her second cycle.   She's doing great job trying to help her husband. He has metastatic gastroesophageal cancer.  I will plan to see her back in 2 months.  Volanda Napoleon, MD 1/12/201711:23 AM

## 2015-06-10 NOTE — Patient Instructions (Signed)
[facility] Discharge Instructions for Patients Receiving Chemotherapy  Today you received the following chemotherapy agents Gazyva  To help prevent nausea and vomiting after your treatment, we encourage you to take your nausea medication    If you develop nausea and vomiting that is not controlled by your nausea medication, call the clinic.   BELOW ARE SYMPTOMS THAT SHOULD BE REPORTED IMMEDIATELY:  *FEVER GREATER THAN 100.5 F  *CHILLS WITH OR WITHOUT FEVER  NAUSEA AND VOMITING THAT IS NOT CONTROLLED WITH YOUR NAUSEA MEDICATION  *UNUSUAL SHORTNESS OF BREATH  *UNUSUAL BRUISING OR BLEEDING  TENDERNESS IN MOUTH AND THROAT WITH OR WITHOUT PRESENCE OF ULCERS  *URINARY PROBLEMS  *BOWEL PROBLEMS  UNUSUAL RASH Items with * indicate a potential emergency and should be followed up as soon as possible.  Feel free to call the clinic you have any questions or concerns. The clinic phone number is [phone].  Please show the CHEMO ALERT CARD at check-in to the Emergency Department and triage nurse.   

## 2015-06-15 ENCOUNTER — Other Ambulatory Visit: Payer: Self-pay | Admitting: *Deleted

## 2015-06-15 MED ORDER — LISDEXAMFETAMINE DIMESYLATE 30 MG PO CAPS: 30.0000 mg | ORAL_CAPSULE | Freq: Every day | ORAL | Status: DC

## 2015-06-15 NOTE — Telephone Encounter (Addendum)
Pt LMOM on 06/15/15 at 10:10am requesting Rx.   RF request for Vyvanse LOV: 04/30/15 Next ov: None Last written: 04/30/15 #30 w/ 0RF  Please advise. Thanks.

## 2015-06-16 NOTE — Telephone Encounter (Signed)
Left message stating Rx is ready for p/u. Rx put up front for p/u.

## 2015-06-18 ENCOUNTER — Telehealth: Payer: Self-pay | Admitting: Hematology & Oncology

## 2015-06-18 NOTE — Telephone Encounter (Signed)
Faxed FMLA completed papers to:   F: [phone] - Per pt req        COPY SCANNED

## 2015-06-25 ENCOUNTER — Telehealth: Payer: Self-pay | Admitting: Hematology & Oncology

## 2015-06-25 NOTE — Telephone Encounter (Signed)
Faxed SHORT-TERM DISABILITY papers to [facility] to: P: [id number] F: [phone]         COPY SCANNED

## 2015-07-12 ENCOUNTER — Telehealth: Payer: Self-pay | Admitting: *Deleted

## 2015-07-12 NOTE — Telephone Encounter (Signed)
Pt LMOM on 07/12/15 at 1:09pm stating that she would like a referral to a therapist that deals pts who have cancer. She stated that both her and her husband are being treated for cancer and she would like to talk to someone about this. Please advise. Thanks.

## 2015-07-12 NOTE — Telephone Encounter (Signed)
Pls give patient Townsend Roger contact info (counselor at Dr. Tomasita Crumble McKinney's office in [facility]) and tell her she can initiate an appt--no referral is needed.-thx

## 2015-07-13 NOTE — Telephone Encounter (Signed)
Pt advised and voiced understanding.   

## 2015-07-14 ENCOUNTER — Telehealth: Payer: Self-pay | Admitting: Hematology & Oncology

## 2015-07-14 NOTE — Telephone Encounter (Signed)
Faxed medical records to:  [facility] D[facility] Case: [id number] F: [phone] P: [phone]     COPY SCANNED

## 2015-07-20 ENCOUNTER — Telehealth: Payer: Self-pay | Admitting: Family Medicine

## 2015-07-20 NOTE — Telephone Encounter (Signed)
Pt is bringing her daughter in at 3pm today to see Dr Raoul Pitch and she would like her Vyvanse Rx when she comes in, if possible.

## 2015-07-21 MED ORDER — LISDEXAMFETAMINE DIMESYLATE 30 MG PO CAPS: 30.0000 mg | ORAL_CAPSULE | Freq: Every day | ORAL | Status: DC

## 2015-07-21 NOTE — Telephone Encounter (Signed)
Pt advised and voiced understanding.   

## 2015-07-21 NOTE — Telephone Encounter (Signed)
Rx's for this month and for March printed.

## 2015-07-30 ENCOUNTER — Telehealth: Payer: Self-pay | Admitting: Hematology & Oncology

## 2015-07-30 NOTE — Telephone Encounter (Signed)
Faxed medical records to: [employer] Attn: [id number]  Claim: [id number] Policy: [id number]   Req: 11/27/2014 to current        COPY SCANNED

## 2015-08-06 ENCOUNTER — Ambulatory Visit: Payer: BLUE CROSS/BLUE SHIELD | Admitting: Hematology & Oncology

## 2015-08-06 ENCOUNTER — Ambulatory Visit: Payer: BLUE CROSS/BLUE SHIELD

## 2015-08-06 ENCOUNTER — Other Ambulatory Visit: Payer: BLUE CROSS/BLUE SHIELD

## 2015-08-09 MED FILL — VIT D2 1.25 MG (50,000 UNIT: 1.25 MG | 84 days supply | Qty: 12 | Fill #1

## 2015-08-25 ENCOUNTER — Other Ambulatory Visit: Payer: Self-pay | Admitting: Family Medicine

## 2015-08-25 MED ORDER — LISDEXAMFETAMINE DIMESYLATE 30 MG PO CAPS: 30.0000 mg | ORAL_CAPSULE | Freq: Every day | ORAL | Status: DC

## 2015-08-26 ENCOUNTER — Encounter: Payer: Self-pay | Admitting: Hematology & Oncology

## 2015-08-26 ENCOUNTER — Ambulatory Visit (HOSPITAL_BASED_OUTPATIENT_CLINIC_OR_DEPARTMENT_OTHER): Payer: BLUE CROSS/BLUE SHIELD | Admitting: Hematology & Oncology

## 2015-08-26 ENCOUNTER — Other Ambulatory Visit (HOSPITAL_BASED_OUTPATIENT_CLINIC_OR_DEPARTMENT_OTHER): Payer: BLUE CROSS/BLUE SHIELD

## 2015-08-26 VITALS — BP 93/47 | HR 82 | Temp 98.6°F | Resp 16 | Ht 62.0 in | Wt 126.0 lb

## 2015-08-26 DIAGNOSIS — C8580 Other specified types of non-Hodgkin lymphoma, unspecified site: Secondary | ICD-10-CM | POA: Diagnosis not present

## 2015-08-26 DIAGNOSIS — C8519 Unspecified B-cell lymphoma, extranodal and solid organ sites: Secondary | ICD-10-CM

## 2015-08-26 LAB — CBC WITH DIFFERENTIAL (CANCER CENTER ONLY)
BASO#: 0.2 10*3/uL (ref 0.0–0.2)
BASO%: 2.9 % — ABNORMAL HIGH (ref 0.0–2.0)
EOS%: 4 % (ref 0.0–7.0)
Eosinophils Absolute: 0.2 10*3/uL (ref 0.0–0.5)
HCT: 38.6 % (ref 34.8–46.6)
HGB: 13 g/dL (ref 11.6–15.9)
LYMPH#: 0.6 10*3/uL — ABNORMAL LOW (ref 0.9–3.3)
LYMPH%: 10.6 % — AB (ref 14.0–48.0)
MCH: 30.4 pg (ref 26.0–34.0)
MCHC: 33.7 g/dL (ref 32.0–36.0)
MCV: 90 fL (ref 81–101)
MONO#: 0.6 10*3/uL (ref 0.1–0.9)
MONO%: 9.7 % (ref 0.0–13.0)
NEUT#: 4.3 10*3/uL (ref 1.5–6.5)
NEUT%: 72.8 % (ref 39.6–80.0)
Platelets: 263 10*3/uL (ref 145–400)
RBC: 4.27 10*6/uL (ref 3.70–5.32)
RDW: 13.6 % (ref 11.1–15.7)
WBC: 6 10*3/uL (ref 3.9–10.0)

## 2015-08-26 LAB — COMPREHENSIVE METABOLIC PANEL (CC13)
ALK PHOS: 136 IU/L — AB (ref 39–117)
ALT: 11 IU/L (ref 0–32)
AST: 13 IU/L (ref 0–40)
Albumin, Serum: 4.6 g/dL (ref 3.5–5.5)
Albumin/Globulin Ratio: 2.1 (ref 1.2–2.2)
BUN/Creatinine Ratio: 19 (ref 9–23)
BUN: 11 mg/dL (ref 6–24)
Bilirubin Total: 0.2 mg/dL (ref 0.0–1.2)
CO2: 23 mmol/L (ref 18–29)
CREATININE: 0.57 mg/dL (ref 0.57–1.00)
Calcium, Ser: 9.4 mg/dL (ref 8.7–10.2)
Chloride, Ser: 106 mmol/L (ref 96–106)
GFR calc Af Amer: 132 mL/min/{1.73_m2} (ref >59)
GFR calc non Af Amer: 115 mL/min/{1.73_m2} (ref >59)
GLUCOSE: 100 mg/dL — AB (ref 65–99)
Globulin, Total: 2.2 g/dL (ref 1.5–4.5)
Potassium, Ser: 4.1 mmol/L (ref 3.5–5.2)
SODIUM: 142 mmol/L (ref 134–144)
Total Protein: 6.8 g/dL (ref 6.0–8.5)

## 2015-08-26 NOTE — Progress Notes (Signed)
Hematology and Oncology Follow Up Visit  [name] ON:[id number] 1970-09-08 45 y.o. 08/26/2015   Principle Diagnosis:   Recurrent cutaneous follicular B-cell non-Hodgkin's lymphoma  Current Therapy:    Status post cycle  #4 of Gazyva/Treanda      Interim History:  [name] is back for follow-up. Overall, she is doing okay. She is under quite a bit of stress because of her husband's cancer. He just is not doing as well as I thought he would. He keeps having complications.  She's having some problems with her immune system. I suspect this probably is from her chemotherapy. She keeps getting "colds". I think this probably is a result of her immunoglobulins being decreased.  Because of this, it is hard for her to go back to work. She would be in an environment that would increase her risk of a significant infection.  I suspect that she probably is going to be at significant risk for infection for least another 12-16 months. With the chemotherapy that she received, her immune system might be dysfunctional for quite a while. This, unfortunately, is a unusual convocation from treatment but yet we still see it.   She has had no rashes. She's not noted any nodules under her skin. She's not noted any palpable lymph nodes outside that with her neck on occasion.   Currently, her performance status is ECOG 1  Medications:  Current outpatient prescriptions:  .  ergocalciferol (VITAMIN D2) 50000 UNITS capsule, Take 1 capsule (50,000 Units total) by mouth once a week., Disp: 12 capsule, Rfl: 3 .  lisdexamfetamine (VYVANSE) 30 MG capsule, Take 1 capsule (30 mg total) by mouth daily., Disp: 30 capsule, Rfl: 0 .  ondansetron (ZOFRAN) 8 MG tablet, Take 1 tablet (8 mg total) by mouth 2 (two) times daily. Start the day after chemo for 2 days. Then take as needed for nausea or vomiting., Disp: 30 tablet, Rfl: 5 .  prochlorperazine (COMPAZINE) 10 MG tablet, Take 1 tablet (10 mg total) by mouth every 6  (six) hours as needed (Nausea or vomiting)., Disp: 30 tablet, Rfl: 5 .  traZODone (DESYREL) 50 MG tablet, 1-2 tabs po qhs prn insomnia, Disp: 60 tablet, Rfl: 6  Allergies: No Known Allergies  Past Medical History, Surgical history, Social history, and Family History were reviewed and updated.  Review of Systems: As above  Physical Exam:  height is 5\' 2"  (1.575 m) and weight is 126 lb (57.153 kg). Her temperature is 98.6 F (37 C). Her blood pressure is 93/47 and her pulse is 82. Her respiration is 16.   Wt Readings from Last 3 Encounters:  08/26/15 126 lb (57.153 kg)  04/30/15 146 lb (66.225 kg)  03/11/15 152 lb (68.947 kg)     Well-developed well nourished white female in no obvious distress. Head and neck exam shows no ocular or oral lesions. She has no palpable cervical or supraclavicular lymph nodes. Lungs are clear. Cardiac exam regular rate and rhythm with no murmurs, rubs or bruits. Abdomen is soft. She has good bowel sounds. There is no fluid wave. There is no palpable liver or spleen tip. Back exam shows the wide local excision scar in the mid upper back. No nodularity is noted under the skin. Extremities shows no clubbing, cyanosis or edema. She has good range motion of her joints. Neurological exam shows no focal neurological deficits. Skin exam shows no rashes, ecchymoses or petechia.  Lab Results  Component Value Date   WBC 6.0 08/26/2015  HGB 13.0 08/26/2015   HCT 38.6 08/26/2015   MCV 90 08/26/2015   PLT 263 08/26/2015     Chemistry      Component Value Date/Time   NA 142 08/26/2015 1457   NA 142 06/10/2015 1004   NA 140 10/19/2014 1608   NA 137 01/17/2013 1111   K 4.1 08/26/2015 1457   K 3.6 06/10/2015 1004   K 3.7 10/19/2014 1608   K 4.1 01/17/2013 1111   CL 106 08/26/2015 1457   CL 105 06/10/2015 1004   CL 106 01/17/2013 1111   CO2 23 08/26/2015 1457   CO2 26 06/10/2015 1004   CO2 23 10/19/2014 1608   CO2 26 01/17/2013 1111   BUN 11 08/26/2015 1457     BUN 7 06/10/2015 1004   BUN 9.8 10/19/2014 1608   BUN 13 01/17/2013 1111   CREATININE 0.57 08/26/2015 1457   CREATININE 0.5* 06/10/2015 1004   CREATININE 0.7 10/19/2014 1608   CREATININE 0.6 01/17/2013 1111      Component Value Date/Time   CALCIUM 9.4 08/26/2015 1457   CALCIUM 9.1 06/10/2015 1004   CALCIUM 8.8 10/19/2014 1608   CALCIUM 9.3 01/17/2013 1111   ALKPHOS 136* 08/26/2015 1457   ALKPHOS 102* 06/10/2015 1004   ALKPHOS 134 10/19/2014 1608   ALKPHOS 89 01/17/2013 1111   AST 13 08/26/2015 1457   AST 21 06/10/2015 1004   AST 14 10/19/2014 1608   AST 11 01/17/2013 1111   ALT 11 08/26/2015 1457   ALT 22 06/10/2015 1004   ALT 15 10/19/2014 1608   ALT 16 01/17/2013 1111   BILITOT 0.2 08/26/2015 1457   BILITOT 0.70 06/10/2015 1004   BILITOT 0.30 10/19/2014 1608   BILITOT 0.6 01/17/2013 1111         Impression and Plan: [name] is 45 year old white female. She has recurrence of her cutaneous B-cell lymphoma. This is her third recurrence. We are treating her with, with what I hope to be, curative therapy with Gazyva/Treanda.  Again, she is in remission. She got into remission after 2 cycles of therapy.  For now, I think we probably hold off on maintenance therapy for her. I think her immune system just is not going to tolerate maintenance therapy. She's not able to work which is a real problem for her. I think that she may have to be out of work for quite a while until her immune system recovers.  She really is going through a lot with her husband and his illness. I just think that with our upfront chemotherapy immunotherapy program, that this really should keep her in a long-term remission and cure.  I think we probably get her back now in 3 months. I will certainly see her a lot more often as she was comes with her husband for his appointments.  Volanda Napoleon, MD 3/30/20175:51 PM

## 2015-08-27 LAB — LACTATE DEHYDROGENASE: LDH: 187 U/L (ref 125–245)

## 2015-08-27 LAB — BETA 2 MICROGLOBULIN, SERUM: BETA 2: 1.4 mg/L (ref 0.6–2.4)

## 2015-08-27 MED FILL — VYVANSE 30 MG CAPSULE: 30 | 30 days supply | Qty: 30 | Fill #0

## 2015-09-22 ENCOUNTER — Other Ambulatory Visit: Payer: Self-pay | Admitting: *Deleted

## 2015-09-22 MED ORDER — LISDEXAMFETAMINE DIMESYLATE 30 MG PO CAPS: 30.0000 mg | ORAL_CAPSULE | Freq: Every day | ORAL | Status: DC

## 2015-09-22 NOTE — Telephone Encounter (Signed)
Pt LMOM on 09/22/15 at 2:27pm requesting refill.   RF request for vyvanse LOV: 04/30/15 Next ov: None Last written: 08/25/15 #30 w/ [id number]  Please advise. Thanks.

## 2015-09-23 NOTE — Telephone Encounter (Signed)
Rx put up front for p/u. Left message on cell vm stating Rx is ready for p/u at front desk.

## 2015-10-07 ENCOUNTER — Telehealth: Payer: Self-pay | Admitting: *Deleted

## 2015-10-07 ENCOUNTER — Other Ambulatory Visit: Payer: Self-pay | Admitting: *Deleted

## 2015-10-07 NOTE — Telephone Encounter (Signed)
This RN was contacted by the patient at 930 am. She stated she would like to resume her care under Dr Jana Hakim ( she has been going to the [facility] in [facility] ). Pt was originally diagnosed and treated by Dr Jana Hakim and then transferred her care to Dr Marin Olp. This RN inquired with pt about current therapy with pt not receiving IV treatment at this time. Pt made aware that Dr Jannifer Rodney will be out of town next week.  Per MD review - plan at present is for pt to obtain labs and then see MD 1 week later per next available opening.  POF sent per above.

## 2015-10-15 ENCOUNTER — Telehealth: Payer: Self-pay | Admitting: Oncology

## 2015-10-15 NOTE — Telephone Encounter (Signed)
lvm for pt regarding to 5.25 appt.Marland KitchenMarland KitchenMarland Kitchen

## 2015-10-20 ENCOUNTER — Ambulatory Visit: Payer: Self-pay | Admitting: Oncology

## 2015-10-20 ENCOUNTER — Other Ambulatory Visit: Payer: Self-pay

## 2015-10-20 NOTE — Progress Notes (Signed)
[facility]  [phone] [phone]     ID: [name] DOB: 1971-04-15  MR#: [id number]  [id number]  Patient Care Team: Tammi Sou, MD as PCP - General (Family Medicine) Volanda Napoleon, MD as Consulting Physician (Oncology) Lavonna Monarch, MD as Consulting Physician (Dermatology) PCP: Tammi Sou, MD GYN: SU: Fanny Skates M.D. OTHER MD: Lavonna Monarch MD, Earle Gell M.D.  CHIEF COMPLAINT: Recurrent non-Hodgkin's lymphoma  CURRENT TREATMENT: obinutuzumab, bendamustine    Patient did not show for her appointment 10/20/2015   HISTORY OF PRESENT ILLNESS: From the earlier summary note:  [name] first noted some skin "bumps" in 1994. Sometime around a year later they seem to her to have grown some so she presented to [facility] where a right shoulder skin lesion was biopsied. This showed a B-cell cutaneous non-Hodgkin's lymphoma, with immunoglobulin heavy chain gene rearrangement showing clonal bands in 2 of 3 endonuclease lanes. She was treated with CHOP 6 completed in March 1996, with complete remission  However she had disease recurrence in 2000. She was treated briefly with chlorambucil/rituximab , then rituximab maintenance. Rituximab maintenance was continued until February 2005. The patient then was lost to follow-up.  Sometime in the spring of 2015 she noted some lesions on her back that were strongly suggestive of recurrence of the tumor. She really did not want to seek further medical care for this, but one of the lesions in particular, in the upper mid back, seems to her harder and like it is growing faster. Accordingly she presented for further evaluation and treatment.  Her subsequent history is as detailed below  INTERVAL HISTORY: [name] is scheduled to return today for follow-up of her cutaneous non-Hodgkin's lymphoma. She is being treated under Dr. Marin Olp and today is day 22 cycle 2 of 6 planned cycles of  obinutuzumab and bendamustine. She generally is tolerating the treatment well. She has had an early complete clinical response. She is receiving her treatments and follow-up in [facility] where her husband Sherren Mocha is also being treated for his GI cancer.  REVIEW OF SYSTEMS:  PAST MEDICAL HISTORY: Past Medical History  Diagnosis Date  . HSV-2 (herpes simplex virus 2) infection   . Non-Hodgkin's lymphoma of skin ([place])     Cutaneous B cell lymphoma.  Initial dx age 58--treated with chemo.  Recurrence x 2 after the birth of each of her children--different chemo regimen used.  Recurrence again spring 2016-in the mid/upper back.  Plan is for chemo with Dr. Antonieta Pert office.  . Menorrhagia     Regular: 7 days of bleeding (2 heavy and 5 light).  . Adult ADHD   . Tobacco dependence in remission   . Anxiety and depression     PAST SURGICAL HISTORY: Past Surgical History  Procedure Laterality Date  . Tubal ligation    . Hysteroscopy  08/2007    polyp  . Endometrial ablation  06/2008  . Colonoscopy  appox age [age] yrs    Normal  . Basal cell carcinoma excision      FAMILY HISTORY Family History  Problem Relation Age of Onset  . Cancer Paternal Uncle     colon  . Alcohol abuse Maternal Grandfather   . Cancer Paternal Grandmother   . Heart disease Paternal Grandfather    the patient's parents are still living. She has 2 brothers, one half-sister. There is no history of lymphoma, leukemia, or other cancer in the family to her knowledge  GYNECOLOGIC HISTORY:  No LMP recorded.  Patient is not currently having periods (Reason: Chemotherapy). Menarche age 76, first live birth age 64, she is [place] P2. She still has regular menstrual periods.  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 Her husband Sherren Mocha is [employer] of an [employer] underwriter. Their children are Gracie, 16, and Sawyer, 48. The patient is not a church attender    ADVANCED DIRECTIVES: Not in place   HEALTH  MAINTENANCE: Social History  Substance Use Topics  . Smoking status: Former Smoker -- 0.50 packs/day for 10 years    Types: Cigarettes    Quit date: 09/08/2012  . Smokeless tobacco: Never Used     Comment: Quit smoking 2 years ago  . Alcohol Use: 0.0 oz/week    0 Standard drinks or equivalent per week     Comment: social     Colonoscopy: 2006/[facility]  PAP:  Bone density:  Lipid panel:  No Known Allergies  Current Outpatient Prescriptions  Medication Sig Dispense Refill  . ergocalciferol (VITAMIN D2) 50000 UNITS capsule Take 1 capsule (50,000 Units total) by mouth once a week. 12 capsule 3  . lisdexamfetamine (VYVANSE) 30 MG capsule Take 1 capsule (30 mg total) by mouth daily. 30 capsule 0  . ondansetron (ZOFRAN) 8 MG tablet Take 1 tablet (8 mg total) by mouth 2 (two) times daily. Start the day after chemo for 2 days. Then take as needed for nausea or vomiting. 30 tablet 5  . prochlorperazine (COMPAZINE) 10 MG tablet Take 1 tablet (10 mg total) by mouth every 6 (six) hours as needed (Nausea or vomiting). 30 tablet 5  . traZODone (DESYREL) 50 MG tablet 1-2 tabs po qhs prn insomnia 60 tablet 6   No current facility-administered medications for this visit.    OBJECTIVE: Young white woman  There were no vitals filed for this visit.   There is no weight on file to calculate BMI.    E      LAB RESULTS: CMP     Component Value Date/Time   NA 142 08/26/2015 1457   NA 142 06/10/2015 1004   NA 140 10/19/2014 1608   NA 137 01/17/2013 1111   K 4.1 08/26/2015 1457   K 3.6 06/10/2015 1004   K 3.7 10/19/2014 1608   K 4.1 01/17/2013 1111   CL 106 08/26/2015 1457   CL 105 06/10/2015 1004   CL 106 01/17/2013 1111   CO2 23 08/26/2015 1457   CO2 26 06/10/2015 1004   CO2 23 10/19/2014 1608   CO2 26 01/17/2013 1111   GLUCOSE 100* 08/26/2015 1457   GLUCOSE 91 06/10/2015 1004   GLUCOSE 91 10/19/2014 1608   GLUCOSE 85 01/17/2013 1111   BUN 11 08/26/2015 1457   BUN 7 06/10/2015  1004   BUN 9.8 10/19/2014 1608   BUN 13 01/17/2013 1111   CREATININE 0.57 08/26/2015 1457   CREATININE 0.5* 06/10/2015 1004   CREATININE 0.7 10/19/2014 1608   CREATININE 0.6 01/17/2013 1111   CALCIUM 9.4 08/26/2015 1457   CALCIUM 9.1 06/10/2015 1004   CALCIUM 8.8 10/19/2014 1608   CALCIUM 9.3 01/17/2013 1111   PROT 6.8 08/26/2015 1457   PROT 6.7 06/10/2015 1004   PROT 6.7 10/19/2014 1608   PROT 7.0 01/17/2013 1111   ALBUMIN 4.6 08/26/2015 1457   ALBUMIN 4.0 06/10/2015 1004   ALBUMIN 3.8 10/19/2014 1608   ALBUMIN 4.2 01/17/2013 1111   AST 13 08/26/2015 1457   AST 21 06/10/2015 1004   AST 14 10/19/2014 1608  AST 11 01/17/2013 1111   ALT 11 08/26/2015 1457   ALT 22 06/10/2015 1004   ALT 15 10/19/2014 1608   ALT 16 01/17/2013 1111   ALKPHOS 136* 08/26/2015 1457   ALKPHOS 102* 06/10/2015 1004   ALKPHOS 134 10/19/2014 1608   ALKPHOS 89 01/17/2013 1111   BILITOT 0.2 08/26/2015 1457   BILITOT 0.70 06/10/2015 1004   BILITOT 0.30 10/19/2014 1608   BILITOT 0.6 01/17/2013 1111   GFRNONAA 115 08/26/2015 1457   GFRAA 132 08/26/2015 1457    INo results found for: SPEP, UPEP  Lab Results  Component Value Date   WBC 6.0 08/26/2015   NEUTROABS 4.3 08/26/2015   HGB 13.0 08/26/2015   HCT 38.6 08/26/2015   MCV 90 08/26/2015   PLT 263 08/26/2015      Chemistry      Component Value Date/Time   NA 142 08/26/2015 1457   NA 142 06/10/2015 1004   NA 140 10/19/2014 1608   NA 137 01/17/2013 1111   K 4.1 08/26/2015 1457   K 3.6 06/10/2015 1004   K 3.7 10/19/2014 1608   K 4.1 01/17/2013 1111   CL 106 08/26/2015 1457   CL 105 06/10/2015 1004   CL 106 01/17/2013 1111   CO2 23 08/26/2015 1457   CO2 26 06/10/2015 1004   CO2 23 10/19/2014 1608   CO2 26 01/17/2013 1111   BUN 11 08/26/2015 1457   BUN 7 06/10/2015 1004   BUN 9.8 10/19/2014 1608   BUN 13 01/17/2013 1111   CREATININE 0.57 08/26/2015 1457   CREATININE 0.5* 06/10/2015 1004   CREATININE 0.7 10/19/2014 1608    CREATININE 0.6 01/17/2013 1111      Component Value Date/Time   CALCIUM 9.4 08/26/2015 1457   CALCIUM 9.1 06/10/2015 1004   CALCIUM 8.8 10/19/2014 1608   CALCIUM 9.3 01/17/2013 1111   ALKPHOS 136* 08/26/2015 1457   ALKPHOS 102* 06/10/2015 1004   ALKPHOS 134 10/19/2014 1608   ALKPHOS 89 01/17/2013 1111   AST 13 08/26/2015 1457   AST 21 06/10/2015 1004   AST 14 10/19/2014 1608   AST 11 01/17/2013 1111   ALT 11 08/26/2015 1457   ALT 22 06/10/2015 1004   ALT 15 10/19/2014 1608   ALT 16 01/17/2013 1111   BILITOT 0.2 08/26/2015 1457   BILITOT 0.70 06/10/2015 1004   BILITOT 0.30 10/19/2014 1608   BILITOT 0.6 01/17/2013 1111       No results found for: LABCA2  No components found for: LABCA125  No results for input(s): INR in the last 168 hours.  Urinalysis No results found for: COLORURINE, APPEARANCEUR, LABSPEC, PHURINE, GLUCOSEU, HGBUR, BILIRUBINUR, KETONESUR, PROTEINUR, UROBILINOGEN, NITRITE, LEUKOCYTESUR  STUDIES: No results found.  ASSESSMENT: 45 y.o. [place] woman with a history of B-cell cutaneous non-Hodgkin's lymphoma initially diagnosed in 1995 through biopsy of a right shoulder lesion at [facility], with immunoglobulin heavy chain gene rearrangement studies at that time showing clonal bands in 2 of 3 internuclear ACE lanes  (1) received cyclophosphamide, vincristine, doxorubicin and prednisone (CHOP) 6 cycles completed March 1996  (2) recurrence in 2000 treated with chlorambucil, then rituximab maintenance, last dose February 2004  (3) recurrence noted clinically May 2016 with several lesions on the back, as well as night sweats, but no other "B" symptoms  (a) PET scanning in 11/01/2024 shows 2 skin lesions overlying the thoracic spine but no nodal or visceral involvement  (b) biopsy of an upper mid back skin lesion 11/20/2014 shows primary cutaneous follicle  center B-cell non-Hodgkin's lymphoma  (4) obinutuzumab/ bendamustine started 12/16/2014, completing 4 cycles  03/12/2015  (a) restaging PET scan 03/26/2015 shows complete clinical response  (b) maintenance obatuzumab stopped after 06/10/2015 dose  (5) hepatitis serologies 12/16/2014 negative   PLAN: No show   Chauncey Cruel, MD   10/20/2015 8:44 AM Medical Oncology and Hematology [facility] [address] [place], [place] [address] Tel. [phone]    Fax. [phone]

## 2015-10-21 ENCOUNTER — Other Ambulatory Visit (HOSPITAL_BASED_OUTPATIENT_CLINIC_OR_DEPARTMENT_OTHER): Payer: Self-pay

## 2015-10-21 ENCOUNTER — Ambulatory Visit (HOSPITAL_BASED_OUTPATIENT_CLINIC_OR_DEPARTMENT_OTHER): Payer: Self-pay | Admitting: Hematology & Oncology

## 2015-10-21 ENCOUNTER — Encounter: Payer: Self-pay | Admitting: Hematology & Oncology

## 2015-10-21 VITALS — BP 105/44 | HR 90 | Temp 98.4°F | Resp 14 | Ht 62.0 in | Wt 117.0 lb

## 2015-10-21 DIAGNOSIS — C8580 Other specified types of non-Hodgkin lymphoma, unspecified site: Secondary | ICD-10-CM

## 2015-10-21 DIAGNOSIS — F419 Anxiety disorder, unspecified: Secondary | ICD-10-CM

## 2015-10-21 DIAGNOSIS — C8519 Unspecified B-cell lymphoma, extranodal and solid organ sites: Secondary | ICD-10-CM

## 2015-10-21 DIAGNOSIS — R634 Abnormal weight loss: Secondary | ICD-10-CM

## 2015-10-21 DIAGNOSIS — F329 Major depressive disorder, single episode, unspecified: Secondary | ICD-10-CM

## 2015-10-21 LAB — COMPREHENSIVE METABOLIC PANEL
ALBUMIN: 4.3 g/dL (ref 3.5–5.0)
ALK PHOS: 106 U/L (ref 40–150)
ANION GAP: 9 meq/L (ref 3–11)
AST: 13 U/L (ref 5–34)
BILIRUBIN TOTAL: 0.37 mg/dL (ref 0.20–1.20)
BUN: 9 mg/dL (ref 7.0–26.0)
CALCIUM: 9.5 mg/dL (ref 8.4–10.4)
CHLORIDE: 106 meq/L (ref 98–109)
CO2: 26 mEq/L (ref 22–29)
CREATININE: 0.7 mg/dL (ref 0.6–1.1)
EGFR: >90 mL/min/{1.73_m2} (ref >90)
Glucose: 92 mg/dl (ref 70–140)
Potassium: 3.3 mEq/L — ABNORMAL LOW (ref 3.5–5.1)
Sodium: 141 mEq/L (ref 136–145)
Total Protein: 7 g/dL (ref 6.4–8.3)

## 2015-10-21 LAB — CBC WITH DIFFERENTIAL (CANCER CENTER ONLY)
BASO#: 0.1 10*3/uL (ref 0.0–0.2)
BASO%: 2.3 % — ABNORMAL HIGH (ref 0.0–2.0)
EOS%: 3 % (ref 0.0–7.0)
Eosinophils Absolute: 0.2 10*3/uL (ref 0.0–0.5)
HEMATOCRIT: 38.2 % (ref 34.8–46.6)
HGB: 12.8 g/dL (ref 11.6–15.9)
LYMPH#: 0.7 10*3/uL — AB (ref 0.9–3.3)
LYMPH%: 11.6 % — AB (ref 14.0–48.0)
MCH: 30.5 pg (ref 26.0–34.0)
MCHC: 33.5 g/dL (ref 32.0–36.0)
MCV: 91 fL (ref 81–101)
MONO#: 0.6 10*3/uL (ref 0.1–0.9)
MONO%: 9.6 % (ref 0.0–13.0)
NEUT#: 4.5 10*3/uL (ref 1.5–6.5)
NEUT%: 73.5 % (ref 39.6–80.0)
PLATELETS: 271 10*3/uL (ref 145–400)
RBC: 4.19 10*6/uL (ref 3.70–5.32)
RDW: 13.2 % (ref 11.1–15.7)
WBC: 6.1 10*3/uL (ref 3.9–10.0)

## 2015-10-21 LAB — LACTATE DEHYDROGENASE: LDH: 205 U/L (ref 125–245)

## 2015-10-21 MED ORDER — BUSPIRONE HCL 7.5 MG PO TABS: 7.5000 mg | ORAL_TABLET | Freq: Two times a day (BID) | ORAL | Status: DC

## 2015-10-21 MED FILL — busPIRone HCL 7.5 MG TABS: 7.5 | 30 days supply | Qty: 60 | Fill #0

## 2015-10-21 NOTE — Progress Notes (Signed)
Hematology and Oncology Follow Up Visit  [name] ON:[id number] 09/12/1970 45 y.o. 10/21/2015   Principle Diagnosis:   Recurrent cutaneous follicular B-cell non-Hodgkin's lymphoma  Current Therapy:    Status post cycle  #4 of Gazyva/Treanda      Interim History:  [name] is back for follow-up. She has really had a tough go of it. She is literally spending all of her time trying to help her husband who is dying from esophageal cancer. He was recently hospitalized. There've been a lot of issues that she's had to endure trying to care for him. Thankfully, his quality life is doing a little bit better right now. She says that she acts and got him out in their truck for a ride. They went to the [employer]. This made him feel better.  She has felt tired. This is understandable. She has a lot of anxiety. Again, this is understandable. She is on Klonopin but this makes her feel too tired. I will try her on some BuSpar which hopefully will not make her tired.  She has had weight loss. She's by loss about 15 pounds. She is not a big woman to begin with. I suspect that weight loss probably is from her care and for her husband but I don't want to overlook the possibility of the lymphoma.   She's had some erratic monthly cycles. Again, this is understandable given the stress that she is under and the fact that she probably is not eating all that well.   She has had no nodules. She's had no swollen lymph nodes. She's had no fever.   There is not any change in our bladder habits.   Overall, I would say her performance status is ECOG 1.   Medications:  Current outpatient prescriptions:  .  ergocalciferol (VITAMIN D2) 50000 UNITS capsule, Take 1 capsule (50,000 Units total) by mouth once a week., Disp: 12 capsule, Rfl: 3 .  lisdexamfetamine (VYVANSE) 30 MG capsule, Take 1 capsule (30 mg total) by mouth daily., Disp: 30 capsule, Rfl: 0 .  ondansetron (ZOFRAN) 8 MG tablet, Take 1 tablet (8  mg total) by mouth 2 (two) times daily. Start the day after chemo for 2 days. Then take as needed for nausea or vomiting., Disp: 30 tablet, Rfl: 5 .  prochlorperazine (COMPAZINE) 10 MG tablet, Take 1 tablet (10 mg total) by mouth every 6 (six) hours as needed (Nausea or vomiting)., Disp: 30 tablet, Rfl: 5 .  busPIRone (BUSPAR) 7.5 MG tablet, Take 1 tablet (7.5 mg total) by mouth 2 (two) times daily., Disp: 60 tablet, Rfl: 0 .  clonazePAM (KLONOPIN) 0.5 MG tablet, Reported on 10/21/2015, Disp: , Rfl: 1 .  traZODone (DESYREL) 50 MG tablet, 1-2 tabs po qhs prn insomnia, Disp: 60 tablet, Rfl: 6  Allergies: No Known Allergies  Past Medical History, Surgical history, Social history, and Family History were reviewed and updated.  Review of Systems: As above  Physical Exam:  height is 5\' 2"  (1.575 m) and weight is 117 lb (53.071 kg). Her oral temperature is 98.4 F (36.9 C). Her blood pressure is 105/44 and her pulse is 90. Her respiration is 14.   Wt Readings from Last 3 Encounters:  10/21/15 117 lb (53.071 kg)  08/26/15 126 lb (57.153 kg)  04/30/15 146 lb (66.225 kg)     Well-developed well nourished white female in no obvious distress. Head and neck exam shows no ocular or oral lesions. She has no palpable cervical or supraclavicular  lymph nodes. Lungs are clear. Cardiac exam regular rate and rhythm with no murmurs, rubs or bruits. Abdomen is soft. She has good bowel sounds. There is no fluid wave. There is no palpable liver or spleen tip. Back exam shows the wide local excision scar in the mid upper back. No nodularity is noted under the skin. Extremities shows no clubbing, cyanosis or edema. She has good range motion of her joints. Neurological exam shows no focal neurological deficits. Skin exam shows no rashes, ecchymoses or petechia.  Lab Results  Component Value Date   WBC 6.1 10/21/2015   HGB 12.8 10/21/2015   HCT 38.2 10/21/2015   MCV 91 10/21/2015   PLT 271 10/21/2015      Chemistry      Component Value Date/Time   NA 141 10/21/2015 1136   NA 142 08/26/2015 1457   NA 142 06/10/2015 1004   NA 137 01/17/2013 1111   K 3.3* 10/21/2015 1136   K 4.1 08/26/2015 1457   K 3.6 06/10/2015 1004   K 4.1 01/17/2013 1111   CL 106 08/26/2015 1457   CL 105 06/10/2015 1004   CL 106 01/17/2013 1111   CO2 26 10/21/2015 1136   CO2 23 08/26/2015 1457   CO2 26 06/10/2015 1004   CO2 26 01/17/2013 1111   BUN 9.0 10/21/2015 1136   BUN 11 08/26/2015 1457   BUN 7 06/10/2015 1004   BUN 13 01/17/2013 1111   CREATININE 0.7 10/21/2015 1136   CREATININE 0.57 08/26/2015 1457   CREATININE 0.5* 06/10/2015 1004   CREATININE 0.6 01/17/2013 1111      Component Value Date/Time   CALCIUM 9.5 10/21/2015 1136   CALCIUM 9.4 08/26/2015 1457   CALCIUM 9.1 06/10/2015 1004   CALCIUM 9.3 01/17/2013 1111   ALKPHOS 106 10/21/2015 1136   ALKPHOS 136* 08/26/2015 1457   ALKPHOS 102* 06/10/2015 1004   ALKPHOS 89 01/17/2013 1111   AST 13 10/21/2015 1136   AST 13 08/26/2015 1457   AST 21 06/10/2015 1004   AST 11 01/17/2013 1111   ALT <9 10/21/2015 1136   ALT 11 08/26/2015 1457   ALT 22 06/10/2015 1004   ALT 16 01/17/2013 1111   BILITOT 0.37 10/21/2015 1136   BILITOT 0.2 08/26/2015 1457   BILITOT 0.70 06/10/2015 1004   BILITOT 0.6 01/17/2013 1111         Impression and Plan: [name] is 45 year old white female. She has recurrence of her cutaneous B-cell lymphoma. This is her third recurrence. We are treating her with, with what I hope to be, curative therapy with Gazyva/Treanda.  Again, she is in remission. She got into remission after 2 cycles of therapy.  She has been incredibly busy trying to help her husband. He, unfortunately, is passing away from his metastatic esophageal cancer. She really has been up all day and all night with him. She really has sacrifice her own health to try to help him out. I really had to give her so much credit for being such a strong woman and doing  what she can do to try to give him some quality of life for the last few weeks that he has.  I do want to make sure that we do not overlook her own healthcare. I do want to get a PET scan on her. She is certainly at risk for recurrence as she is alert he had a couple recurrences already. A PET scan would really help [place] out.  Because of her husband's  illness, we have not been able to embark upon maintenance therapy with Gazyva. I think this would clearly help her out. I think this would really help maintain her in remission. I think that we would just he do this once every 3 months.   I told her that she can just let us know when she will be ready to start [place]. I would probably think that this might be once her husband has passed on. I would not think that he would make it through June.  I spent about 45 minutes with her. I just want her to know how much I respect her and admire her for all the current that she has shown throughout her own illness and again, sacrificing herself to help her husband out.  Again, I just told her to let us know when she would like to start treatment.  Volanda Napoleon, MD 5/25/20176:21 PM

## 2015-10-22 ENCOUNTER — Telehealth: Payer: Self-pay | Admitting: *Deleted

## 2015-10-22 LAB — BETA 2 MICROGLOBULIN, SERUM: Beta-2: 1.5 mg/L (ref 0.6–2.4)

## 2015-10-22 NOTE — Telephone Encounter (Addendum)
Attempted x 2 to reach patient. Left voice mail for her to call back, and didn't receive call. Will re attempt on Tuesday.  ----- Message from Volanda Napoleon, MD sent at 10/21/2015  3:12 PM EDT ----- Call - your potassium is a little low!!  Please take one of Todd's K+ pills every 3 days!!!  pete

## 2015-10-26 ENCOUNTER — Telehealth: Payer: Self-pay | Admitting: *Deleted

## 2015-10-26 DIAGNOSIS — C8519 Unspecified B-cell lymphoma, extranodal and solid organ sites: Secondary | ICD-10-CM

## 2015-10-26 MED ORDER — POTASSIUM CHLORIDE CRYS ER 20 MEQ PO TBCR: EXTENDED_RELEASE_TABLET | ORAL | Status: AC

## 2015-10-26 NOTE — Telephone Encounter (Addendum)
Patient aware of results and new prescription sent.   ----- Message from Volanda Napoleon, MD sent at 10/21/2015  3:12 PM EDT ----- Call - your potassium is a little low!!  Please take one of Todd's K+ pills every 3 days!!!  [name]

## 2015-11-03 ENCOUNTER — Encounter: Payer: Self-pay | Admitting: *Deleted

## 2015-11-03 ENCOUNTER — Ambulatory Visit (HOSPITAL_COMMUNITY)
Admission: RE | Admit: 2015-11-03 | Discharge: 2015-11-03 | Disposition: A | Discharge status: Home / Self Care | Payer: BLUE CROSS/BLUE SHIELD | Source: Ambulatory Visit | Attending: Hematology & Oncology | Admitting: Hematology & Oncology

## 2015-11-03 DIAGNOSIS — R634 Abnormal weight loss: Secondary | ICD-10-CM | POA: Diagnosis present

## 2015-11-03 DIAGNOSIS — R938 Abnormal findings on diagnostic imaging of other specified body structures: Secondary | ICD-10-CM | POA: Diagnosis not present

## 2015-11-03 DIAGNOSIS — C8519 Unspecified B-cell lymphoma, extranodal and solid organ sites: Secondary | ICD-10-CM

## 2015-11-03 DIAGNOSIS — F419 Anxiety disorder, unspecified: Secondary | ICD-10-CM

## 2015-11-03 DIAGNOSIS — F329 Major depressive disorder, single episode, unspecified: Secondary | ICD-10-CM

## 2015-11-03 DIAGNOSIS — F418 Other specified anxiety disorders: Secondary | ICD-10-CM | POA: Insufficient documentation

## 2015-11-03 DIAGNOSIS — C8580 Other specified types of non-Hodgkin lymphoma, unspecified site: Secondary | ICD-10-CM | POA: Insufficient documentation

## 2015-11-03 LAB — GLUCOSE, CAPILLARY: GLUCOSE-CAPILLARY: 74 mg/dL (ref 65–99)

## 2015-11-03 MED ORDER — FLUDEOXYGLUCOSE F - 18 (FDG) INJECTION
5.8200 | Freq: Once | INTRAVENOUS | Status: AC | PRN
  Administered 2015-11-03: 5.82 via INTRAVENOUS

## 2015-11-11 ENCOUNTER — Other Ambulatory Visit: Payer: Self-pay | Admitting: Nurse Practitioner

## 2015-11-12 ENCOUNTER — Other Ambulatory Visit: Payer: Self-pay | Admitting: Family Medicine

## 2015-11-12 MED ORDER — LISDEXAMFETAMINE DIMESYLATE 30 MG PO CAPS: 30.0000 mg | ORAL_CAPSULE | Freq: Every day | ORAL | Status: DC

## 2015-11-12 NOTE — Telephone Encounter (Signed)
Rx put up front for p/u. Left message on home and cell vm advising pt that Rx is ready for p/u.

## 2015-11-12 NOTE — Telephone Encounter (Signed)
RF request for Vyvanse LOV: 04/30/15 Next ov: None Last written: 09/22/15 #30 w/ 0RF  Please advise. Thanks.

## 2015-11-12 NOTE — Telephone Encounter (Signed)
Pt calling to request a refill on her Vyvannse. Please call when ready for p/u.

## 2015-11-17 ENCOUNTER — Other Ambulatory Visit: Payer: Self-pay | Admitting: *Deleted

## 2015-11-17 MED ORDER — LORAZEPAM 0.5 MG PO TABS: 0.5000 mg | ORAL_TABLET | Freq: Three times a day (TID) | ORAL | Status: DC

## 2015-11-23 ENCOUNTER — Other Ambulatory Visit: Payer: Self-pay | Admitting: *Deleted

## 2015-11-23 DIAGNOSIS — C8519 Unspecified B-cell lymphoma, extranodal and solid organ sites: Secondary | ICD-10-CM

## 2015-11-24 ENCOUNTER — Ambulatory Visit (HOSPITAL_BASED_OUTPATIENT_CLINIC_OR_DEPARTMENT_OTHER): Payer: BLUE CROSS/BLUE SHIELD | Admitting: Hematology & Oncology

## 2015-11-24 ENCOUNTER — Other Ambulatory Visit (HOSPITAL_BASED_OUTPATIENT_CLINIC_OR_DEPARTMENT_OTHER): Payer: BLUE CROSS/BLUE SHIELD

## 2015-11-24 ENCOUNTER — Encounter: Payer: Self-pay | Admitting: Hematology & Oncology

## 2015-11-24 ENCOUNTER — Other Ambulatory Visit: Payer: Self-pay | Admitting: *Deleted

## 2015-11-24 VITALS — BP 98/70 | HR 109 | Temp 98.0°F | Resp 14 | Ht 62.0 in | Wt 112.0 lb

## 2015-11-24 DIAGNOSIS — R634 Abnormal weight loss: Secondary | ICD-10-CM

## 2015-11-24 DIAGNOSIS — C8580 Other specified types of non-Hodgkin lymphoma, unspecified site: Secondary | ICD-10-CM

## 2015-11-24 DIAGNOSIS — C8519 Unspecified B-cell lymphoma, extranodal and solid organ sites: Secondary | ICD-10-CM

## 2015-11-24 DIAGNOSIS — F419 Anxiety disorder, unspecified: Secondary | ICD-10-CM

## 2015-11-24 DIAGNOSIS — F329 Major depressive disorder, single episode, unspecified: Secondary | ICD-10-CM

## 2015-11-24 LAB — COMPREHENSIVE METABOLIC PANEL
ALBUMIN: 4.2 g/dL (ref 3.5–5.0)
ALK PHOS: 112 U/L (ref 40–150)
ALT: 9 U/L (ref 0–55)
ANION GAP: 10 meq/L (ref 3–11)
AST: 15 U/L (ref 5–34)
BILIRUBIN TOTAL: 0.32 mg/dL (ref 0.20–1.20)
BUN: 10.6 mg/dL (ref 7.0–26.0)
CALCIUM: 9.3 mg/dL (ref 8.4–10.4)
CO2: 25 meq/L (ref 22–29)
CREATININE: 0.7 mg/dL (ref 0.6–1.1)
Chloride: 105 mEq/L (ref 98–109)
Glucose: 105 mg/dl (ref 70–140)
Potassium: 3.4 mEq/L — ABNORMAL LOW (ref 3.5–5.1)
Sodium: 139 mEq/L (ref 136–145)
TOTAL PROTEIN: 7 g/dL (ref 6.4–8.3)

## 2015-11-24 LAB — CBC WITH DIFFERENTIAL (CANCER CENTER ONLY)
BASO#: 0.2 10*3/uL (ref 0.0–0.2)
BASO%: 3.2 % — ABNORMAL HIGH (ref 0.0–2.0)
EOS ABS: 0.1 10*3/uL (ref 0.0–0.5)
EOS%: 2 % (ref 0.0–7.0)
HCT: 38.8 % (ref 34.8–46.6)
HEMOGLOBIN: 12.9 g/dL (ref 11.6–15.9)
LYMPH#: 0.7 10*3/uL — ABNORMAL LOW (ref 0.9–3.3)
LYMPH%: 12.1 % — AB (ref 14.0–48.0)
MCH: 30.3 pg (ref 26.0–34.0)
MCHC: 33.2 g/dL (ref 32.0–36.0)
MCV: 91 fL (ref 81–101)
MONO#: 0.5 10*3/uL (ref 0.1–0.9)
MONO%: 8.4 % (ref 0.0–13.0)
NEUT%: 74.3 % (ref 39.6–80.0)
NEUTROS ABS: 4 10*3/uL (ref 1.5–6.5)
Platelets: 283 10*3/uL (ref 145–400)
RBC: 4.26 10*6/uL (ref 3.70–5.32)
RDW: 13.4 % (ref 11.1–15.7)
WBC: 5.4 10*3/uL (ref 3.9–10.0)

## 2015-11-24 LAB — LACTATE DEHYDROGENASE: LDH: 188 U/L (ref 125–245)

## 2015-11-24 MED ORDER — LORAZEPAM 1 MG PO TABS: 1.0000 mg | ORAL_TABLET | Freq: Three times a day (TID) | ORAL | Status: DC

## 2015-11-24 MED FILL — LORazepam 1 MG TABS: 1 | 30 days supply | Qty: 90 | Fill #0

## 2015-11-24 NOTE — Progress Notes (Signed)
Hematology and Oncology Follow Up Visit  [name] [id number] [birth year] 45 y.o. 11/24/2015   Principle Diagnosis:   Recurrent cutaneous follicular B-cell non-Hodgkin's lymphoma  Current Therapy:    Status post cycle  #4 of Gazyva/Treanda  Maintenance Gazyva - she will call to start      Interim History:  [name] is back for follow-up. Despite all the issues that she has dealt with, she is slowly getting better. I know that she has lost a little more weight. She just does not have a lot of appetite. She says when she eats, she gets full work weekly. I think that she probably has a "shrunken stomach" from not eating much over the past few months. I would think that this will improve. I told her to try some over-the-counter antacids which might help her eat a little bit more.   She is still trying to get through her husband's passing. Her kids are having attempts time with this. Thankfully, she does have a lot of support. Her brother-in-law is actually taking she and her kids to the beach next week. I told her to wear a lot of sunscreen and to drink a lot of water.   We did do a PET scan on her. This did not show any evidence of recurrent lymphoma.   She really needs to get on maintenance Gazyva. Given that this is her second recurrence, she really is at high risk for another recurrence. Dyann Kief has proven to decrease the risk of recurrence significantly. However, with Gazyva, she will definitely have a higher risk of infection. She will be more fatigued. She may have hepatic and pulmonary issues. I think it would be tough for her to work while she gets this. I would be worried about her coming down with an infection from being in the workplace.   Currently, overall, her performance status is ECOG 1-2.  Medications:  Current outpatient prescriptions:  .  ergocalciferol (VITAMIN D2) 50000 UNITS capsule, Take 1 capsule (50,000 Units total) by mouth once a week., Disp: 12 capsule,  Rfl: 3 .  lisdexamfetamine (VYVANSE) 30 MG capsule, Take 1 capsule (30 mg total) by mouth daily., Disp: 30 capsule, Rfl: 0 .  ondansetron (ZOFRAN) 8 MG tablet, Take 1 tablet (8 mg total) by mouth 2 (two) times daily. Start the day after chemo for 2 days. Then take as needed for nausea or vomiting., Disp: 30 tablet, Rfl: 5 .  potassium chloride SA (K-DUR,KLOR-CON) 20 MEQ tablet, One tablet once every three days., Disp: 10 tablet, Rfl: 2 .  prochlorperazine (COMPAZINE) 10 MG tablet, Take 1 tablet (10 mg total) by mouth every 6 (six) hours as needed (Nausea or vomiting)., Disp: 30 tablet, Rfl: 5 .  LORazepam (ATIVAN) 1 MG tablet, Take 1 tablet (1 mg total) by mouth every 8 (eight) hours., Disp: 90 tablet, Rfl: 0  Allergies: No Known Allergies  Past Medical History, Surgical history, Social history, and Family History were reviewed and updated.  Review of Systems: As above  Physical Exam:  height is 5\' 2"  (1.575 m) and weight is 112 lb (50.803 kg). Her oral temperature is 98 F (36.7 C). Her blood pressure is 98/70 and her pulse is 109. Her respiration is 14.   Wt Readings from Last 3 Encounters:  11/24/15 112 lb (50.803 kg)  10/21/15 117 lb (53.071 kg)  08/26/15 126 lb (57.153 kg)     Well-developed well nourished white female in no obvious distress. Head and neck exam shows  no ocular or oral lesions. She has no palpable cervical or supraclavicular lymph nodes. Lungs are clear. Cardiac exam regular rate and rhythm with no murmurs, rubs or bruits. Abdomen is soft. She has good bowel sounds. There is no fluid wave. There is no palpable liver or spleen tip. Back exam shows the wide local excision scar in the mid upper back. No nodularity is noted under the skin. Extremities shows no clubbing, cyanosis or edema. She has good range motion of her joints. Neurological exam shows no focal neurological deficits. Skin exam shows no rashes, ecchymoses or petechia.  Lab Results  Component Value Date    WBC 5.4 11/24/2015   HGB 12.9 11/24/2015   HCT 38.8 11/24/2015   MCV 91 11/24/2015   PLT 283 11/24/2015     Chemistry      Component Value Date/Time   NA 141 10/21/2015 1136   NA 142 08/26/2015 1457   NA 142 06/10/2015 1004   NA 137 01/17/2013 1111   K 3.3* 10/21/2015 1136   K 4.1 08/26/2015 1457   K 3.6 06/10/2015 1004   K 4.1 01/17/2013 1111   CL 106 08/26/2015 1457   CL 105 06/10/2015 1004   CL 106 01/17/2013 1111   CO2 26 10/21/2015 1136   CO2 23 08/26/2015 1457   CO2 26 06/10/2015 1004   CO2 26 01/17/2013 1111   BUN 9.0 10/21/2015 1136   BUN 11 08/26/2015 1457   BUN 7 06/10/2015 1004   BUN 13 01/17/2013 1111   CREATININE 0.7 10/21/2015 1136   CREATININE 0.57 08/26/2015 1457   CREATININE 0.5* 06/10/2015 1004   CREATININE 0.6 01/17/2013 1111      Component Value Date/Time   CALCIUM 9.5 10/21/2015 1136   CALCIUM 9.4 08/26/2015 1457   CALCIUM 9.1 06/10/2015 1004   CALCIUM 9.3 01/17/2013 1111   ALKPHOS 106 10/21/2015 1136   ALKPHOS 136* 08/26/2015 1457   ALKPHOS 102* 06/10/2015 1004   ALKPHOS 89 01/17/2013 1111   AST 13 10/21/2015 1136   AST 13 08/26/2015 1457   AST 21 06/10/2015 1004   AST 11 01/17/2013 1111   ALT <9 10/21/2015 1136   ALT 11 08/26/2015 1457   ALT 22 06/10/2015 1004   ALT 16 01/17/2013 1111   BILITOT 0.37 10/21/2015 1136   BILITOT 0.2 08/26/2015 1457   BILITOT 0.70 06/10/2015 1004   BILITOT 0.6 01/17/2013 1111         Impression and Plan: [name] is 45 year old white female. She has recurrence of her cutaneous B-cell lymphoma. This is her Second recurrence.  She received chemotherapy with Gazyva/Treanda. We only went 4 cycles as she cannot continue the additional 2 cycles because of her husband's illness. As such, she definitely is at high risk for recurrence. I'm glad the PET scan showed no obvious disease. However, given that this is a follicular type of low-grade lymphoma, this will recur.  She now is in a position that she can  finally start maintenance Gazyva. This clearly will help her out. However, he does have some side effects that she will have to watch out for and that will make it difficult for her to work. I know that she probably would want to work but I think that she would be at risk for infection which could be significant from the [place].   We talked about this. She needs to continue to take care of her family. She wants to make sure that her lymphoma does not come back.  As such, she will agree with the [facility].   I spent about 40 minutes with her. I told her to let us know if she wants to start a Gazyva. As such, she will call us. When she starts, will dictate when we get her back.   Volanda Napoleon, MD 6/28/20172:14 PM

## 2015-11-25 ENCOUNTER — Other Ambulatory Visit: Payer: BLUE CROSS/BLUE SHIELD

## 2015-11-25 ENCOUNTER — Ambulatory Visit: Payer: BLUE CROSS/BLUE SHIELD | Admitting: Hematology & Oncology

## 2015-11-25 LAB — TSH: TSH: 1.049 m[IU]/L (ref 0.308–3.960)

## 2015-11-25 LAB — BETA 2 MICROGLOBULIN, SERUM: BETA 2: 1.6 mg/L (ref 0.6–2.4)

## 2015-11-27 DIAGNOSIS — G119 Hereditary ataxia, unspecified: Secondary | ICD-10-CM

## 2015-11-27 HISTORY — DX: Hereditary ataxia, unspecified: G11.9

## 2015-12-14 ENCOUNTER — Telehealth: Payer: Self-pay | Admitting: Family Medicine

## 2015-12-14 MED ORDER — LISDEXAMFETAMINE DIMESYLATE 30 MG PO CAPS: 30.0000 mg | ORAL_CAPSULE | Freq: Every day | ORAL | Status: DC

## 2015-12-14 NOTE — Telephone Encounter (Signed)
RF request for vyvanse LOV: 04/30/15 Next ov: 12/17/15 Last written: 11/12/15 #30 w/ 0RF  Please advise. Thanks.

## 2015-12-14 NOTE — Telephone Encounter (Signed)
Vyvanse rx printed. 

## 2015-12-14 NOTE — Telephone Encounter (Signed)
Patient calling to request appt with PCP for new onset of depression due to the death of her husband.  Patient requesting to come in on Friday, appt scheduled on Friday as requested.  However, patient states she took her last pill of lisdexamfetamine (VYVANSE) 30 MG capsule today and is requesting to pick up rx for this medication tomorrow.

## 2015-12-15 NOTE — Telephone Encounter (Signed)
Left message for pt advising Rx is ready for p/u at front desk.

## 2015-12-17 ENCOUNTER — Telehealth: Payer: Self-pay | Admitting: Family Medicine

## 2015-12-17 ENCOUNTER — Ambulatory Visit: Payer: BLUE CROSS/BLUE SHIELD | Admitting: Family Medicine

## 2015-12-17 NOTE — Telephone Encounter (Signed)
Patient is requesting an appointment for Monday. There are no openings. Please advise.

## 2015-12-17 NOTE — Telephone Encounter (Signed)
Spoke to pt apt made for Monday 12/20/15 at 3:00pm.

## 2015-12-20 ENCOUNTER — Encounter (HOSPITAL_COMMUNITY): Payer: Self-pay | Admitting: Emergency Medicine

## 2015-12-20 ENCOUNTER — Ambulatory Visit: Payer: BLUE CROSS/BLUE SHIELD | Admitting: Family Medicine

## 2015-12-20 ENCOUNTER — Emergency Department (HOSPITAL_COMMUNITY): Payer: BLUE CROSS/BLUE SHIELD

## 2015-12-20 ENCOUNTER — Emergency Department (HOSPITAL_COMMUNITY)
Admission: EM | Admit: 2015-12-20 | Discharge: 2015-12-20 | Disposition: A | Discharge status: Home / Self Care | Payer: BLUE CROSS/BLUE SHIELD | Attending: Emergency Medicine | Admitting: Emergency Medicine

## 2015-12-20 DIAGNOSIS — R531 Weakness: Secondary | ICD-10-CM

## 2015-12-20 DIAGNOSIS — F341 Dysthymic disorder: Secondary | ICD-10-CM | POA: Diagnosis not present

## 2015-12-20 DIAGNOSIS — E860 Dehydration: Secondary | ICD-10-CM | POA: Insufficient documentation

## 2015-12-20 DIAGNOSIS — Z8572 Personal history of non-Hodgkin lymphomas: Secondary | ICD-10-CM | POA: Diagnosis not present

## 2015-12-20 DIAGNOSIS — Z79899 Other long term (current) drug therapy: Secondary | ICD-10-CM | POA: Insufficient documentation

## 2015-12-20 DIAGNOSIS — H538 Other visual disturbances: Secondary | ICD-10-CM | POA: Diagnosis present

## 2015-12-20 DIAGNOSIS — Z87891 Personal history of nicotine dependence: Secondary | ICD-10-CM | POA: Insufficient documentation

## 2015-12-20 DIAGNOSIS — F909 Attention-deficit hyperactivity disorder, unspecified type: Secondary | ICD-10-CM | POA: Insufficient documentation

## 2015-12-20 DIAGNOSIS — F1910 Other psychoactive substance abuse, uncomplicated: Secondary | ICD-10-CM | POA: Diagnosis not present

## 2015-12-20 DIAGNOSIS — R110 Nausea: Secondary | ICD-10-CM | POA: Diagnosis not present

## 2015-12-20 LAB — COMPREHENSIVE METABOLIC PANEL
ALT: 12 U/L — ABNORMAL LOW (ref 14–54)
ANION GAP: 7 (ref 5–15)
AST: 21 U/L (ref 15–41)
Albumin: 4.6 g/dL (ref 3.5–5.0)
Alkaline Phosphatase: 101 U/L (ref 38–126)
BUN: 18 mg/dL (ref 6–20)
CHLORIDE: 104 mmol/L (ref 101–111)
CO2: 27 mmol/L (ref 22–32)
Calcium: 9.3 mg/dL (ref 8.9–10.3)
Creatinine, Ser: 0.62 mg/dL (ref 0.44–1.00)
GFR calc Af Amer: >60 mL/min (ref >60)
Glucose, Bld: 106 mg/dL — ABNORMAL HIGH (ref 65–99)
POTASSIUM: 4 mmol/L (ref 3.5–5.1)
Sodium: 138 mmol/L (ref 135–145)
Total Bilirubin: 0.6 mg/dL (ref 0.3–1.2)
Total Protein: 7 g/dL (ref 6.5–8.1)

## 2015-12-20 LAB — CBC
HEMATOCRIT: 40 % (ref 36.0–46.0)
HEMOGLOBIN: 13.1 g/dL (ref 12.0–15.0)
MCH: 29.8 pg (ref 26.0–34.0)
MCHC: 32.8 g/dL (ref 30.0–36.0)
MCV: 90.9 fL (ref 78.0–100.0)
PLATELETS: 291 10*3/uL (ref 150–400)
RBC: 4.4 MIL/uL (ref 3.87–5.11)
RDW: 13.5 % (ref 11.5–15.5)
WBC: 7.5 10*3/uL (ref 4.0–10.5)

## 2015-12-20 LAB — URINALYSIS, ROUTINE W REFLEX MICROSCOPIC
Bilirubin Urine: NEGATIVE
GLUCOSE, UA: NEGATIVE mg/dL
HGB URINE DIPSTICK: NEGATIVE
KETONES UR: 15 mg/dL — AB
LEUKOCYTES UA: NEGATIVE
Nitrite: NEGATIVE
PH: 5.5 (ref 5.0–8.0)
Protein, ur: NEGATIVE mg/dL
Specific Gravity, Urine: 1.03 (ref 1.005–1.030)

## 2015-12-20 LAB — I-STAT BETA HCG BLOOD, ED (MC, WL, AP ONLY)

## 2015-12-20 LAB — CBG MONITORING, ED: GLUCOSE-CAPILLARY: 106 mg/dL — AB (ref 65–99)

## 2015-12-20 MED ORDER — SODIUM CHLORIDE 0.9 % IV BOLUS (SEPSIS)
2000.0000 mL | Freq: Once | INTRAVENOUS | Status: AC
  Administered 2015-12-20: 2000 mL via INTRAVENOUS

## 2015-12-20 MED ORDER — GADOBENATE DIMEGLUMINE 529 MG/ML IV SOLN
10.0000 mL | Freq: Once | INTRAVENOUS | Status: AC | PRN
  Administered 2015-12-20: 10 mL via INTRAVENOUS

## 2015-12-20 MED ORDER — SODIUM CHLORIDE 0.9 % IV BOLUS (SEPSIS)
1000.0000 mL | Freq: Once | INTRAVENOUS | Status: DC
Start: 2015-12-20 — End: 2015-12-20

## 2015-12-20 NOTE — ED Notes (Signed)
Patient transported to MRI 

## 2015-12-20 NOTE — Discharge Instructions (Signed)
Drink at least six 8 ounce glasses of water each day. Avoid marijuana. Pick up your new eyeglasses from your eye doctor's office. Call Dr. Idelle Leech office visit if not feeling back to normal in 3 or 4 days. Return if your condition worsens for any reason

## 2015-12-20 NOTE — ED Triage Notes (Signed)
Patient c/o confusion, trouble walking, blurred vision over week.  Patient states that she is in remission for non hodkins.

## 2015-12-20 NOTE — ED Notes (Signed)
Pt given sandwich and a sprite.

## 2015-12-20 NOTE — ED Provider Notes (Signed)
[facility] DEPT Provider Note   CSN: [id number] Arrival date & time: 12/20/15  1231  First Provider Contact:  First MD Initiated Contact with Patient 12/20/15 1338        History   Chief Complaint Chief Complaint  Patient presents with  . trouble walking  . Altered Mental Status  . Blurred Vision  History of present illness :Complains of difficulty walking and blurred vision onset 2 or 3 days ago, gradually. Patient has been depressed over death of her husband 1 month ago. She last used marijuana yesterday. Other associated symptoms include depression and lightheadedness with standing and diminished appetite. Other associated symptoms include nausea, no vomiting. No other associated symptoms. No fever. No pain anywhere.  No treatment prior to coming here.  HPI [name] is a 45 y.o. female.  HPI  Past Medical History:  Diagnosis Date  . Adult ADHD   . Anxiety and depression   . HSV-2 (herpes simplex virus 2) infection   . Menorrhagia    Regular: 7 days of bleeding (2 heavy and 5 light).  . Non-Hodgkin's lymphoma of skin ([place])    Cutaneous B cell lymphoma.  Initial dx age 37--treated with chemo.  Recurrence x 2 after the birth of each of her children--different chemo regimen used.  Recurrence again spring 2016-in the mid/upper back.  Plan is for chemo with Dr. Antonieta Pert office.  . Tobacco dependence in remission     Patient Active Problem List   Diagnosis Date Noted  . Cutaneous B-cell lymphoma ([place]) 10/19/2014  . Adult ADHD 09/10/2013  . Tobacco dependence 09/10/2013  . Anxiety and depression 08/01/2013  . Major depressive disorder, single episode, mild ([place]) 01/17/2013    Past Surgical History:  Procedure Laterality Date  . BASAL CELL CARCINOMA EXCISION    . COLONOSCOPY  appox age [age] yrs   Normal  . ENDOMETRIAL ABLATION  06/2008  . HYSTEROSCOPY  08/2007   polyp  . TUBAL LIGATION      OB History    Gravida Para Term Preterm AB Living   4 2     2 2      SAB TAB Ectopic Multiple Live Births                   Home Medications    Prior to Admission medications   Medication Sig Start Date End Date Taking? Authorizing Provider  ergocalciferol (VITAMIN D2) 50000 UNITS capsule Take 1 capsule (50,000 Units total) by mouth once a week. 04/19/15   Volanda Napoleon, MD  lisdexamfetamine (VYVANSE) 30 MG capsule Take 1 capsule (30 mg total) by mouth daily. 12/14/15   Tammi Sou, MD  LORazepam (ATIVAN) 1 MG tablet Take 1 tablet (1 mg total) by mouth every 8 (eight) hours. 11/24/15   Volanda Napoleon, MD  ondansetron (ZOFRAN) 8 MG tablet Take 1 tablet (8 mg total) by mouth 2 (two) times daily. Start the day after chemo for 2 days. Then take as needed for nausea or vomiting. 03/11/15   Volanda Napoleon, MD  potassium chloride SA (K-DUR,KLOR-CON) 20 MEQ tablet One tablet once every three days. 10/26/15   Volanda Napoleon, MD  prochlorperazine (COMPAZINE) 10 MG tablet Take 1 tablet (10 mg total) by mouth every 6 (six) hours as needed (Nausea or vomiting). 03/11/15   Volanda Napoleon, MD    Family History Family History  Problem Relation Age of Onset  . Alcohol abuse Maternal Grandfather   . Cancer Paternal Grandmother   .  Heart disease Paternal Grandfather   . Cancer Paternal Uncle     colon    Social History Social History  Substance Use Topics  . Smoking status: Former Smoker    Packs/day: 0.50    Years: 10.00    Types: Cigarettes    Quit date: 09/08/2012  . Smokeless tobacco: Never Used     Comment: Quit smoking 2 years ago  . Alcohol use 0.0 oz/week     Comment: social     Allergies   Review of patient's allergies indicates no known allergies.   Review of Systems Review of Systems  Constitutional: Positive for appetite change.       Diminished appetite for several weeks  Eyes: Positive for visual disturbance.  Gastrointestinal: Positive for nausea.       Chronic nausea  Musculoskeletal: Positive for gait problem.   Allergic/Immunologic: Positive for immunocompromised state.       Non-Hodgkin's lymphoma  Neurological: Positive for light-headedness.  Psychiatric/Behavioral: Positive for dysphoric mood.       Depressed over loss of husband. Denies suicidal ideation  All other systems reviewed and are negative.    Physical Exam Updated Vital Signs BP 106/55   Pulse 104   Temp 98.4 F (36.9 C) (Oral)   Resp 18   Ht 5\' 3"  (1.6 m)   Wt 105 lb (47.6 kg)   SpO2 100%   BMI 18.60 kg/m   Physical Exam  Constitutional: She is oriented to person, place, and time. She appears well-developed and well-nourished. No distress.  HENT:  Head: Normocephalic and atraumatic.  Left Ear: External ear normal.  Mucous membranes dry  Eyes: Conjunctivae are normal. Pupils are equal, round, and reactive to light.  Neck: Neck supple. No tracheal deviation present. No thyromegaly present.  Cardiovascular: Normal rate and regular rhythm.   No murmur heard. Pulmonary/Chest: Effort normal and breath sounds normal.  Abdominal: Soft. Bowel sounds are normal. She exhibits no distension. There is no tenderness.  Musculoskeletal: Normal range of motion. She exhibits no edema or tenderness.  Neurological: She is alert and oriented to person, place, and time. She displays normal reflexes. No cranial nerve deficit. Coordination normal.  Gait normal. Mildly lightheaded on standing. Romberg normal pronator drift normal finger to nose normal  Skin: Skin is warm and dry. No rash noted.  Psychiatric: She has a normal mood and affect. Her behavior is normal.  Nursing note and vitals reviewed.    ED Treatments / Results  Labs (all labs ordered are listed, but only abnormal results are displayed) Labs Reviewed  COMPREHENSIVE METABOLIC PANEL  CBC  CBG MONITORING, ED    EKG  EKG Interpretation None       Radiology No results found.  Procedures Procedures (including critical care time)  Medications Ordered in  ED Medications - No data to display   Initial Impression / Assessment and Plan / ED Course  I have reviewed the triage vital signs and the nursing notes.  Pertinent labs & imaging results that were available during my care of the patient were reviewed by me and considered in my medical decision making (see chart for details). 6:20 PM she feels improved after treatment with intravenous hydration and eating a meal. She reports that her vision may be blurred because she is awaiting new eyeglasses which have been ordered from her eye doctor. Clinical Course  Patient was advised to avoid marijuana. Encourage oral hydration. Follow-up with Dr.Mckeown is not feeling back to normal in 2 or  3 days No MRI evidence of stroke. Generalized weakness likely secondary to dehydration as she's had diminished oral intake  Results for orders placed or performed during the hospital encounter of 12/20/15  Comprehensive metabolic panel  Result Value Ref Range   Sodium 138 135 - 145 mmol/L   Potassium 4.0 3.5 - 5.1 mmol/L   Chloride 104 101 - 111 mmol/L   CO2 27 22 - 32 mmol/L   Glucose, Bld 106 (H) 65 - 99 mg/dL   BUN 18 6 - 20 mg/dL   Creatinine, Ser 0.62 0.44 - 1.00 mg/dL   Calcium 9.3 8.9 - 10.3 mg/dL   Total Protein 7.0 6.5 - 8.1 g/dL   Albumin 4.6 3.5 - 5.0 g/dL   AST 21 15 - 41 U/L   ALT 12 (L) 14 - 54 U/L   Alkaline Phosphatase 101 38 - 126 U/L   Total Bilirubin 0.6 0.3 - 1.2 mg/dL   GFR calc non Af Amer >60 >60 mL/min   GFR calc Af Amer >60 >60 mL/min   Anion gap 7 5 - 15  CBC  Result Value Ref Range   WBC 7.5 4.0 - 10.5 K/uL   RBC 4.40 3.87 - 5.11 MIL/uL   Hemoglobin 13.1 12.0 - 15.0 g/dL   HCT 40.0 36.0 - 46.0 %   MCV 90.9 78.0 - 100.0 fL   MCH 29.8 26.0 - 34.0 pg   MCHC 32.8 30.0 - 36.0 g/dL   RDW 13.5 11.5 - 15.5 %   Platelets 291 150 - 400 K/uL  Urinalysis, Routine w reflex microscopic (not at [facility])  Result Value Ref Range   Color, Urine YELLOW YELLOW   APPearance CLOUDY (A)  CLEAR   Specific Gravity, Urine 1.030 1.005 - 1.030   pH 5.5 5.0 - 8.0   Glucose, UA NEGATIVE NEGATIVE mg/dL   Hgb urine dipstick NEGATIVE NEGATIVE   Bilirubin Urine NEGATIVE NEGATIVE   Ketones, ur 15 (A) NEGATIVE mg/dL   Protein, ur NEGATIVE NEGATIVE mg/dL   Nitrite NEGATIVE NEGATIVE   Leukocytes, UA NEGATIVE NEGATIVE  CBG monitoring, ED  Result Value Ref Range   Glucose-Capillary 106 (H) 65 - 99 mg/dL  I-Stat Beta hCG blood, ED ([facility], [facility], [facility] only)  Result Value Ref Range   I-stat hCG, quantitative <5.0 <5 mIU/mL   Comment 3           Mr Jeri Cos Wo Contrast  Result Date: 12/20/2015 CLINICAL DATA:  Blurred vision with difficulty walking. History of non-Hodgkin's lymphoma and chemotherapy. EXAM: MRI HEAD WITHOUT AND WITH CONTRAST TECHNIQUE: Multiplanar, multiecho pulse sequences of the brain and surrounding structures were obtained without and with intravenous contrast. CONTRAST:  25mL MULTIHANCE GADOBENATE DIMEGLUMINE 529 MG/ML IV SOLN COMPARISON:  None. FINDINGS: Mild atrophy.  Mild ventricular enlargement consistent with atrophy. Negative for acute infarct. Scattered small hyperintensities in the deep white matter bilaterally and in the pons. This may be related to prior chemotherapy or chronic ischemia. Negative for intracranial hemorrhage.  No fluid collection Negative for mass or edema Postcontrast imaging demonstrates normal enhancement. No enhancing mass. Normal venous enhancement. Mild mucosal edema paranasal sinuses. Normal orbit. Pituitary not enlarged. IMPRESSION: No acute intracranial abnormality Generalized atrophy. Chronic appearing changes in the white matter and pons which may be related to prior chemotherapy or chronic microvascular ischemia. Electronically Signed   By: Franchot Gallo M.D.   On: 12/20/2015 15:33  Final Clinical Impressions(s) / ED Diagnoses  Diagnoses #1 weakness #2 dehydration #3 substance abuse Final  diagnoses:  None    New Prescriptions New  Prescriptions   No medications on file     Orlie Dakin, MD 12/20/15 [id number]

## 2015-12-20 NOTE — ED Notes (Signed)
Visual acuity assessed without patient's glasses.  Pt did not bring glasses with her today and denies wearing contacts at this time.

## 2015-12-23 ENCOUNTER — Ambulatory Visit (INDEPENDENT_AMBULATORY_CARE_PROVIDER_SITE_OTHER): Payer: BLUE CROSS/BLUE SHIELD | Admitting: Family Medicine

## 2015-12-23 ENCOUNTER — Encounter: Payer: Self-pay | Admitting: Family Medicine

## 2015-12-23 VITALS — BP 124/63 | HR 108 | Temp 98.1°F | Resp 16 | Ht 62.0 in | Wt 108.0 lb

## 2015-12-23 DIAGNOSIS — F322 Major depressive disorder, single episode, severe without psychotic features: Secondary | ICD-10-CM

## 2015-12-23 MED ORDER — DULOXETINE HCL 30 MG PO CPEP: 30.0000 mg | ORAL_CAPSULE | Freq: Every day | ORAL | 1 refills | Status: DC

## 2015-12-23 NOTE — Progress Notes (Signed)
Pre visit review using our clinic review tool, if applicable. No additional management support is needed unless otherwise documented below in the visit note. 

## 2015-12-23 NOTE — Progress Notes (Signed)
OFFICE VISIT  12/23/2015   CC:  Chief Complaint  Patient presents with  . Depression    due to husbands death  . Follow-up    ER visit   HPI:    Patient is a 45 y.o. Caucasian female who presents for grief reaction due to death of her husband to cancer recently. He passed 11/07/15.  She has felt very depressed.  She is stumbling, wobbling, says she has some blurry vision. Finds it hard to make decisions.  Memory dysfunction present.  Crying spells.  Poor concentration, poor energy level--just lays around in bed.  Has anhedonia.  Describes psychomotor retardation.  No hallucinations.  +Insomnia.  Denies suicidal or homicidal ideation. Takes 1 mg ativan once daily.   Takes 30mg  vyvanse qd.  Still getting chemo for her NHL, has nausea from this, for which she recently used marijuana.  Says he anti-emetics were out of date. She is not eating or drinking well and this has resulted in generalized weakness.  She went to the ED a few days ago and I reviewed these records. Labs normal.  MRI brain normal.  She wants to see a grief counselor and a psychiatrist.    ROS: no focal weakness, no paresthesias.   Past Medical History:  Diagnosis Date  . Adult ADHD   . Anxiety and depression   . HSV-2 (herpes simplex virus 2) infection   . Menorrhagia    Regular: 7 days of bleeding (2 heavy and 5 light).  . Non-Hodgkin's lymphoma of skin ([place])    Cutaneous B cell lymphoma.  Initial dx age 6--treated with chemo.  Recurrence x 2 after the birth of each of her children--different chemo regimen used.  Recurrence again spring 2016-in the mid/upper back.  Plan is for chemo with Dr. Antonieta Pert office.  . Tobacco dependence in remission     Past Surgical History:  Procedure Laterality Date  . BASAL CELL CARCINOMA EXCISION    . COLONOSCOPY  appox age [age] yrs   Normal  . ENDOMETRIAL ABLATION  06/2008  . HYSTEROSCOPY  08/2007   polyp  . TUBAL LIGATION      Outpatient Medications Prior to Visit   Medication Sig Dispense Refill  . ergocalciferol (VITAMIN D2) 50000 UNITS capsule Take 1 capsule (50,000 Units total) by mouth once a week. 12 capsule 3  . lisdexamfetamine (VYVANSE) 30 MG capsule Take 1 capsule (30 mg total) by mouth daily. 30 capsule 0  . LORazepam (ATIVAN) 1 MG tablet Take 1 tablet (1 mg total) by mouth every 8 (eight) hours. (Patient taking differently: Take 1 mg by mouth every 8 (eight) hours as needed for anxiety. ) 90 tablet 0  . potassium chloride SA (K-DUR,KLOR-CON) 20 MEQ tablet One tablet once every three days. 10 tablet 2   No facility-administered medications prior to visit.     No Known Allergies  ROS As per HPI  PE: Blood pressure 124/63, pulse (!) 108, temperature 98.1 F (36.7 C), temperature source Oral, resp. rate 16, height 5\' 2"  (1.575 m), weight 108 lb (49 kg), SpO2 100 %. Gen: Alert, tired- appearing.  Has to have her mother help keep her steady when walking.  Patient is oriented to person, place, time, and situation. [id number]: no injection, icteris, swelling, or exudate.  EOMI, PERRLA. Mouth: lips without lesion/swelling.  Oral mucosa pink and moist. Oropharynx without erythema, exudate, or swelling.  CV: Regular, mild tachycardia, no m/r/g.   LUNGS: CTA bilat, nonlabored resps, good aeration in all lung  fields. EXT: no clubbing, cyanosis, or edema.  Neuro: CN 2-12 intact bilaterally, strength 4-/5 in proximal and distal upper extremities and lower extremities bilaterally.  No sensory deficits.  No tremor.  FNF bilat normal.  No ataxia.     LAB.   Chemistry      Component Value Date/Time   NA 138 12/20/2015 1337   NA 139 11/24/2015 1327   K 4.0 12/20/2015 1337   K 3.4 (L) 11/24/2015 1327   CL 104 12/20/2015 1337   CL 106 08/26/2015 1457   CL 105 06/10/2015 1004   CO2 27 12/20/2015 1337   CO2 25 11/24/2015 1327   BUN 18 12/20/2015 1337   BUN 10.6 11/24/2015 1327   CREATININE 0.62 12/20/2015 1337   CREATININE 0.7 11/24/2015 1327       Component Value Date/Time   CALCIUM 9.3 12/20/2015 1337   CALCIUM 9.3 11/24/2015 1327   ALKPHOS 101 12/20/2015 1337   ALKPHOS 112 11/24/2015 1327   AST 21 12/20/2015 1337   AST 15 11/24/2015 1327   ALT 12 (L) 12/20/2015 1337   ALT 9 11/24/2015 1327   BILITOT 0.6 12/20/2015 1337   BILITOT 0.32 11/24/2015 1327     Lab Results  Component Value Date   WBC 7.5 12/20/2015   HGB 13.1 12/20/2015   HCT 40.0 12/20/2015   MCV 90.9 12/20/2015   PLT 291 12/20/2015   Lab Results  Component Value Date   TSH 1.049 11/24/2015    IMPRESSION AND PLAN:  1) Major depressive disorder: displays psychomotor retardation. Pt is in favor of psychiatrist referral so I ordered this today. Grief counseling information also given to pt today via [facility]. Started duloxetine 30mg  qd today.  2) Generalized weakness/dehydration: encouraged pt to eat and drink. This is likely secondary to both her depression and her ongoing cancer treatments and it's side effects. She said she will address anti-emetics with oncology.  I discouraged any further use of marijuana due to its CNS depressant effects. An After Visit Summary was printed and given to the patient.  FOLLOW UP: Return in about 3 weeks (around 01/13/2016) for f/u depression.  Signed:  Crissie Sickles, MD           12/23/2015

## 2015-12-27 ENCOUNTER — Telehealth: Payer: Self-pay | Admitting: Family Medicine

## 2015-12-27 ENCOUNTER — Ambulatory Visit (INDEPENDENT_AMBULATORY_CARE_PROVIDER_SITE_OTHER): Payer: BLUE CROSS/BLUE SHIELD | Admitting: Family Medicine

## 2015-12-27 ENCOUNTER — Encounter: Payer: Self-pay | Admitting: Family Medicine

## 2015-12-27 VITALS — BP 107/71 | HR 101 | Temp 98.4°F | Resp 16 | Ht 62.0 in | Wt 107.5 lb

## 2015-12-27 DIAGNOSIS — R4781 Slurred speech: Secondary | ICD-10-CM

## 2015-12-27 DIAGNOSIS — F332 Major depressive disorder, recurrent severe without psychotic features: Secondary | ICD-10-CM

## 2015-12-27 DIAGNOSIS — R270 Ataxia, unspecified: Secondary | ICD-10-CM

## 2015-12-27 DIAGNOSIS — R202 Paresthesia of skin: Secondary | ICD-10-CM

## 2015-12-27 MED ORDER — FLUOXETINE HCL (PMDD) 10 MG PO CAPS: 1.0000 | ORAL_CAPSULE | Freq: Every day | ORAL | 0 refills | Status: DC

## 2015-12-27 NOTE — Telephone Encounter (Signed)
Patient Name: [name] DOB: 09-30-1970 Initial Comment caller states dtr has slurred speech and unable to walk Nurse Assessment Nurse: Ronnald Ramp, RN, Miranda Date/Time (Eastern Time): 12/27/2015 1:34:41 PM Confirm and document reason for call. If symptomatic, describe symptoms. You must click the next button to save text entered. ---Caller states her daughter is having trouble with her speech and trouble walking for > 1 week. She was seen in ED last Monday and then follow up with PCP. Had MRI at the ED that was negative. She was diagnosed with major depression and grief. Mom states her symptoms are getting worse. Has the patient traveled out of the country within the last 30 days? ---Not Applicable Does the patient have any new or worsening symptoms? ---Yes Will a triage be completed? ---Yes Related visit to physician within the last 2 weeks? ---Yes Does the PT have any chronic conditions? (i.e. diabetes, asthma, etc.) ---Yes List chronic conditions. ---Lymphoma in remission, Depression Is the patient pregnant or possibly pregnant? (Ask all females between the ages of 44-55) ---No Is this a behavioral health or substance abuse call? ---No Guidelines Guideline Title Affirmed Question Affirmed Notes Neurologic Deficit [1] Loss of speech or garbled speech AND [2] gradual onset (e.g., days to weeks) AND [3] present now Final Disposition User See Physician within 4 Hours (or PCP triage) Ronnald Ramp, RN, Miranda Comments Appt scheduled with PCP at 3:45pm Referrals REFERRED TO PCP OFFICE Disagree/Comply: Comply

## 2015-12-27 NOTE — Telephone Encounter (Signed)
Patient does have an appointment at [facility] in January.

## 2015-12-27 NOTE — Progress Notes (Signed)
OFFICE VISIT  12/27/2015   CC:  Chief Complaint  Patient presents with  . Difficulty Walking  . Speech Problem   HPI:    Patient is a 45 y.o. Caucasian female who presents for ongoing problems with difficulty walking, speech difficulty, and severe depression.  I started her on duloxetine 4 d/a and she feels like this may have made her worse but she finds it hard to be specific about what "worse" means.   We reviewed her situation again since she has some odd ataxia and slurred speech----which I am currently treating as MDD, severe, with psychomotor retardation.  Onset of severe sx's about 2 wks ago.  She essentially can't function due to her mental effects from depression and physical effects.  Feels uncoordinated, feels like she would fall if not using a walker or getting assistance from her mother.  She can brush her teeth and fix herself a snack but otherwise feels pretty helpless.  Her shoulders and hands feel numb.  Feet don't feel numb.  She is often confused and has to be reminded of where she is or what she is doing.  Asks questions that she should know the answer to.  Speech is slow and sometimes she slurs her words slightly. She denies pain anywhere except left ear.  She has a productive cough last few days.  No fevers.  No n/v/d.  She is constipated. Last BM was 10+ days ago.  She does feel bloated/distended in abdomen.  Fleet suppository on a few occasions was tried, got a little result.  Denies vaginal bleeding or hematuria.  No urinary urgency, no dysuria. She is managing to eat and drink fairly well.  No HA.  No vision complaints: she wears corrective lenses. Has ongoing feeling of severe sadness/depression/anhedonia.  No SI or HI.  When she was seen in the ED for these same sx's on 12/20/15, the EDP did an MRI w and w/o contrast and the impression was: No acute intracranial abnormality.  Generalized atrophy.  Chronic appearing changes in the white matter and pons which may be  related to prior chemotherapy or chronic microvascular ischemia.   Past Medical History:  Diagnosis Date  . Adult ADHD   . Anxiety and depression   . HSV-2 (herpes simplex virus 2) infection   . Menorrhagia    Regular: 7 days of bleeding (2 heavy and 5 light).  . 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  Recurrence x 2 after the birth of each of her children--different chemo regimen used.  Recurrence again spring 2016-in the mid/upper back, and as of 10/2015 PET scan showed no sign of dz.  Dr. Marin Olp wants her on maintenance Gavyza to prevent recurrence but as of 12/27/15 pt has not chosen to do this yet.  . Tobacco dependence in remission     Past Surgical History:  Procedure Laterality Date  . BASAL CELL CARCINOMA EXCISION    . COLONOSCOPY  appox age [age] yrs   Normal  . ENDOMETRIAL ABLATION  06/2008  . HYSTEROSCOPY  08/2007   polyp  . TUBAL LIGATION      Outpatient Medications Prior to Visit  Medication Sig Dispense Refill  . ergocalciferol (VITAMIN D2) 50000 UNITS capsule Take 1 capsule (50,000 Units total) by mouth once a week. 12 capsule 3  . LORazepam (ATIVAN) 1 MG tablet Take 1 tablet (1 mg total) by mouth every 8 (eight) hours. (Patient taking  differently: Take 1 mg by mouth every 8 (eight) hours as needed for anxiety. ) 90 tablet 0  . potassium chloride SA (K-DUR,KLOR-CON) 20 MEQ tablet One tablet once every three days. 10 tablet 2  . lisdexamfetamine (VYVANSE) 30 MG capsule Take 1 capsule (30 mg total) by mouth daily. 30 capsule 0  . DULoxetine (CYMBALTA) 30 MG capsule Take 1 capsule (30 mg total) by mouth daily. (Patient not taking: Reported on 12/27/2015) 30 capsule 1   No facility-administered medications prior to visit.     No Known Allergies  ROS As per HPI  PE: Blood pressure 107/71, pulse (!) 101, temperature 98.4 F (36.9 C), temperature source Oral, resp. rate 16, height 5\' 2"  (1.575 m),  weight 107 lb 8 oz (48.8 kg), SpO2 98 %. Gen: Alert, tired and slightly disheveled-appearing.  Patient is oriented to person, place, time, and situation. She displays lucid thought and speech. MMSE today 24/30 (-3 for recall and -3 for concentration). Neuro: CN 2-12 intact bilaterally, strength 5/5 in proximal and distal upper extremities and lower extremities bilaterally.  No sensory deficits.  No tremor.  FNF normal bilat.   Upper extremity and lower extremity DTRs symmetric.  No pronator drift.  When she walks she takes slow, small steps and appears to walk like she is in pain, veers some as well.   ENT: Ears: EACs clear, normal epithelium.  TMs with good light reflex and landmarks bilaterally.  Eyes: no injection, icteris, swelling, or exudate.  EOMI, PERRLA. Nose: no drainage or turbinate edema/swelling.  No injection or focal lesion.  Mouth: lips without lesion/swelling.  Oral mucosa pink and moist.  Dentition intact and without obvious caries or gingival swelling.  Oropharynx without erythema, exudate, or swelling.  Neck - No masses or thyromegaly or limitation in range of motion CV: RRR, no m/r/g.   LUNGS: CTA bilat, nonlabored resps, good aeration in all lung fields. ABD: soft, NT/ND EXT: no clubbing, cyanosis, or edema.  Skin - no sores or suspicious lesions or rashes or color changes  LABS:  None today    Chemistry      Component Value Date/Time   NA 138 12/20/2015 1337   NA 139 11/24/2015 1327   K 4.0 12/20/2015 1337   K 3.4 (L) 11/24/2015 1327   CL 104 12/20/2015 1337   CL 106 08/26/2015 1457   CL 105 06/10/2015 1004   CO2 27 12/20/2015 1337   CO2 25 11/24/2015 1327   BUN 18 12/20/2015 1337   BUN 10.6 11/24/2015 1327   CREATININE 0.62 12/20/2015 1337   CREATININE 0.7 11/24/2015 1327      Component Value Date/Time   CALCIUM 9.3 12/20/2015 1337   CALCIUM 9.3 11/24/2015 1327   ALKPHOS 101 12/20/2015 1337   ALKPHOS 112 11/24/2015 1327   AST 21 12/20/2015 1337   AST 15  11/24/2015 1327   ALT 12 (L) 12/20/2015 1337   ALT 9 11/24/2015 1327   BILITOT 0.6 12/20/2015 1337   BILITOT 0.32 11/24/2015 1327     Lab Results  Component Value Date   WBC 7.5 12/20/2015   HGB 13.1 12/20/2015   HCT 40.0 12/20/2015   MCV 90.9 12/20/2015   PLT 291 12/20/2015   Lab Results  Component Value Date   TSH 1.049 11/24/2015    IMPRESSION AND PLAN:  1) Major depressive disorder, severe, 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 She didn't tolerate cymbalta but unclear why. She is open to trial of a different antidepressant: we'll try fluoxetine 10mg  qd,  which she says she recalls being on in the remote past.  She has already been set up to see a psychiatrist but I don't think this will be for quite a while--possibly months.  2) Ataxia, slurred speech, cognitive slowing:  MRI brain w/out any findings that would explain why she just started having these sx's 2 weeks ago. Will check vit B12 level today. Otherwise, I will ask neurology to see her ASAP.  An After Visit Summary was printed and given to the patient.  Spent 50 min with pt today, with >50% of this time spent in counseling and care coordination regarding the above problems.  FOLLOW UP: Return in about 1 week (around 01/03/2016) for f/u depression/ataxia.  Signed:  Crissie Sickles, MD           12/27/2015

## 2015-12-27 NOTE — Patient Instructions (Signed)
Take 1/2 of your 30mg  vyvanse capsule once a day for 10 days, then stop this medication.

## 2015-12-27 NOTE — Telephone Encounter (Signed)
Patient is requesting a change in medication, patient is with her mother right now, patient stopped taking her new medication since she felt like it was making her worse . Patient gave verbal permission to speak with her mother. Patient's speech is very slurred. Patient's mother said that patient is unable to walk.  I transferred the call to [facility]. Patient's mother would still like to talk to Dr. Idelle Leech assistant.

## 2015-12-27 NOTE — Progress Notes (Signed)
Pre visit review using our clinic review tool, if applicable. No additional management support is needed unless otherwise documented below in the visit note. 

## 2015-12-27 NOTE — Telephone Encounter (Signed)
TH schedule pt to be seen today by Dr. Anitra Lauth at 3:45pm.

## 2015-12-28 LAB — VITAMIN B12: Vitamin B-12: 681 pg/mL (ref 211–911)

## 2015-12-30 ENCOUNTER — Encounter: Payer: Self-pay | Admitting: Neurology

## 2015-12-30 ENCOUNTER — Ambulatory Visit (INDEPENDENT_AMBULATORY_CARE_PROVIDER_SITE_OTHER): Payer: BLUE CROSS/BLUE SHIELD | Admitting: Neurology

## 2015-12-30 VITALS — BP 114/66 | HR 96 | Ht 62.0 in | Wt 107.8 lb

## 2015-12-30 DIAGNOSIS — G119 Hereditary ataxia, unspecified: Secondary | ICD-10-CM | POA: Insufficient documentation

## 2015-12-30 DIAGNOSIS — R260 Ataxic gait: Secondary | ICD-10-CM | POA: Diagnosis not present

## 2015-12-30 NOTE — Patient Instructions (Signed)
I had a long discussion with the patient and her mother regarding her subacute gait ataxia which is of undetermined etiology. Possibilities include remote effect of cancer paraneoplastic syndrome versus toxicity from recent chemotherapy. I encouraged her to use a wheeled walker at all times and referred her to physical and occupational therapy for gait and balance training. Check paraneoplastic antibody panel labs, MRI scan of the brain and cervical spine. She will return for follow-up in 2 months or call earlier if necessary  Ataxia Ataxia is a condition that results in unsteadiness when walking and standing, poor coordination of body movements, and difficulty maintaining an upright posture. It occurs due to a problem with the part of your brain that controls coordination and stability (cerebellar dysfunction).  CAUSES  Ataxia can develop later in life (acquired ataxia) during your 20s to 30s, and even as late as into your 60s or beyond. Acquired ataxia may be caused by:  Changes in your nervous system (neurodegenerative).  Changes throughout your body (systemic disorders).  Excess exposure to:  Medicines, such as phenytoin and lithium.  Solvents.  Abuse of alcohol (alcoholism).  Medical conditions, such as:  Celiac sprue.  Hypothyroidism.  Vitamin E deficiency.  Structural brain abnormalities, such as tumors.  Multiple sclerosis.  Stroke.  Head injury. Ataxia may also be present early in life (non-acquired ataxia). There are two main types of non-acquired ataxia:  Cerebellar dysfunction present at birth (congenital).  Family inheritance (genetic heredity). Friedreich ataxia is the most common form of hereditary ataxia. SIGNS AND SYMPTOMS The signs and symptoms of ataxia can vary depending on how severe the condition is that causes it. Signs and symptoms may include:  Unsteadiness.  Walking with a wide stance.  Tremor.  Poorly coordinated body movements.  Difficulty  maintaining a straight (upright) posture.  Fatigue.  Changes in your speech.  Changes in your vision.  Difficulty swallowing.  Difficulty with writing.  Decreased mental status (dementia).  Muscle spasms. DIAGNOSIS  Ataxia is diagnosed by discussing your personal and family history and through a physical exam. You may also have additional tests such as:  MRI.  Genetic testing. TREATMENT  Treatment for ataxia may include treating or removing the underlying condition causing the ataxia. Surgery may be required if a structural abnormality in your brain is causing the ataxia. Otherwise, supportive treatments may be used to manage your symptoms. HOME CARE INSTRUCTIONS Monitor your ataxia for any changes. The following actions may help any discomfort you are experiencing:   Do not drink alcohol.  Lie down right away if you become very unsteady, dizzy, nauseated, or feel like you are going to faint. Wait until all of these feelings pass before you get up again. SEEK IMMEDIATE MEDICAL CARE IF:  Your unsteadiness suddenly worsens.  You develop severe headaches, chest pain, or abdominal pain.   You have weakness or numbness on one side of your body.   You have problems with your vision.   You feel confused.   You have difficulty speaking.   You have an irregular heartbeat or a very fast pulse.    This information is not intended to replace advice given to you by your health care provider. Make sure you discuss any questions you have with your health care provider.   Document Released: 12/10/2013 Document Reviewed: 12/10/2013 Elsevier Interactive Patient Education [employer].

## 2015-12-30 NOTE — Progress Notes (Signed)
[facility] [address] [place]. [place] [address] [phone]       OFFICE CONSULT NOTE  [name] Date of Birth:  [birth year] Medical Record Number:  [id number]   Referring MD:  Crissie Sickles Reason for Referral:  ataxia HPI: 45 year Caucasian lady who is accompanied today by his her mother. She has had significant problems with walking, balance as well as speech for the last 2-3 weeks. She states she was seen at [facility] emergency room  On 7/24/17with dehydration. She feels uncoordinated, feels like she would fall if not using a walker or getting assistance from her mother.  She can brush her teeth and fix herself a snack but otherwise feels pretty helpless.  Her shoulders and hands feel numb.  Feet don't feel numb.  She is often confused and has to be reminded of where she is or what she is doing.  Asks questions that she should know the answer to.  Speech is slow and sometimes she slurs her words slightly. She has ongoing feelings of severe sadness and depression and her primary physician initially started her on Prozac followed by conservator both of which did not work and she is currently on Zoloft. Patient denies any headache, double vision, loss of vision, extremity weakness. She just feels like off balance and coordination and has been able to walk now with a walker. She's had no falls or injuries. She had lab work done by primary physician including vitamin B12 and TSH 6 weeks ago both of which were normal. She has history of Cutaneous B cell lymphoma.  Initial dx age 45--treated with chemo.  Recurrence x 2 after the birth of each of her children--different chemo regimen used.  Recurrence again spring 2016-in the mid/upper back, and as of 10/2015 PET scan showed no sign of dz. she was treated with 4 cycles of Gazyva/Treanda and Dr. and never wants her on maintenance  Gazyva but the patient is thinking about it  ROS:   14 system review of systems is positive  for  weight loss, blurred vision, incontinence, increased thirst, memory loss, confusion, weakness, slurred speech, dizziness, anxiety, depression, decreased energy, change in appetite, disinterest in activities and all other systems negative PMH:  Past Medical History:  Diagnosis Date  . Adult ADHD   . Anxiety and depression   . HSV-2 (herpes simplex virus 2) infection   . Menorrhagia    Regular: 7 days of bleeding (2 heavy and 5 light).  . 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  Recurrence x 2 after the birth of each of her children--different chemo regimen used.  Recurrence again spring 2016-in the mid/upper back, and as of 10/2015 PET scan showed no sign of dz.  Dr. Marin Olp wants her on maintenance Gavyza to prevent recurrence but as of 12/27/15 pt has not chosen to do this yet.  . Tobacco dependence in remission     Social History:  Social History   Social History  . Marital status: Married    Spouse name: N/A  . Number of children: N/A  . Years of education: N/A   Occupational History  . Not on file.   Social History Main Topics  . Smoking status: Former Smoker    Packs/day: 0.50    Years: 10.00    Types: Cigarettes    Quit date: 09/08/2012  . Smokeless tobacco: Never Used  Comment: Quit smoking 2 years ago  . Alcohol use 0.6 oz/week    1 Glasses of wine per week     Comment: social  . Drug use: No  . Sexual activity: Yes    Birth control/ protection: Surgical   Other Topics Concern  . Not on file   Social History Narrative   Married, 2 children.   Orig from [place] area of [place].   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   Tob 15 pack-yr hx.   Alcohol: a few drinks on weekend.   Drugs: none    Medications:   Current Outpatient Prescriptions on File Prior to Visit  Medication Sig Dispense Refill  . ergocalciferol (VITAMIN D2) 50000 UNITS capsule Take 1 capsule (50,000 Units  total) by mouth once a week. 12 capsule 3  . LORazepam (ATIVAN) 1 MG tablet Take 1 tablet (1 mg total) by mouth every 8 (eight) hours. (Patient taking differently: Take 1 mg by mouth every 8 (eight) hours as needed for anxiety. ) 90 tablet 0  . potassium chloride SA (K-DUR,KLOR-CON) 20 MEQ tablet One tablet once every three days. 10 tablet 2   No current facility-administered medications on file prior to visit.     Allergies:  No Known Allergies  Physical Exam General: Frail and malnourished looking middle-aged Caucasian lady, seated, in no evident distress Head: head normocephalic and atraumatic.   Neck: supple with no carotid or supraclavicular bruits Cardiovascular: regular rate and rhythm, no murmurs Musculoskeletal: no deformity Skin:  no rash/petichiae Vascular:  Normal pulses all extremities  Neurologic Exam Mental Status: Awake and fully alert. Oriented to place and time. Recent and remote memory intact. Attention span, concentration and fund of knowledge appropriate. Mood and affect appropriate.  Cranial Nerves: Fundoscopic exam reveals sharp disc margins. Pupils equal, briskly reactive to light. Extraocular movements full without nystagmus But has a dysmetric saccadic eye movements with slow saccades  . Visual fields full to confrontation. Hearing intact. Facial sensation intact. Face, tongue, palate moves normally and symmetrically.  Motor: Normal bulk and tone. Normal strength in all tested extremity muscles. Sensory.: intact to touch , pinprick , position and vibratory sensation.  Coordination: Rapid alternating movements normal in all extremities. Finger-to-nose  n performed accurately bilaterally. But has knee to heal ataxia right greater than left. Gait and Station: Arises from chair with slight difficulty. Stance is broad-based. Wide-base ataxic gait and uses walker. Unsteady while standing on a narrow base. Reflexes: 1+ and symmetric. Toes downgoing.        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    PLAN: I had a long discussion with the patient and her mother regarding her subacute gait ataxia which is of undetermined etiology. Possibilities include remote effect of cancer paraneoplastic syndrome versus toxicity from recent chemotherapy with [employer]. I encouraged her to use a wheeled walker at all times and referred her to physical and occupational therapy for gait and balance training. Check paraneoplastic antibody panel labs, MRI scan of the brain and cervical spine. Greater than 50% time during this 45 minute consultation visit was spent on counseling and coordination of care about it ataxia. She will return for follow-up in 2 months or call earlier if necessary  Antony Contras, MD  [facility] [address] [place], [place] [address]  Phone  [phone] Fax [phone]  Note: This document was prepared with digital dictation and possible smart phrase technology. Any transcriptional errors that result from this process are unintentional.

## 2016-01-05 ENCOUNTER — Encounter: Payer: Self-pay | Admitting: Family Medicine

## 2016-01-05 ENCOUNTER — Telehealth: Payer: Self-pay | Admitting: Neurology

## 2016-01-05 NOTE — Telephone Encounter (Signed)
[name] called to schedule MRI for [facility]. Please call.

## 2016-01-09 ENCOUNTER — Encounter: Payer: Self-pay | Admitting: Family Medicine

## 2016-01-11 ENCOUNTER — Ambulatory Visit (HOSPITAL_COMMUNITY)
Admission: RE | Admit: 2016-01-11 | Discharge: 2016-01-11 | Disposition: A | Discharge status: Home / Self Care | Payer: BLUE CROSS/BLUE SHIELD | Source: Ambulatory Visit | Attending: Neurology | Admitting: Neurology

## 2016-01-11 ENCOUNTER — Other Ambulatory Visit: Payer: Self-pay | Admitting: Neurology

## 2016-01-11 DIAGNOSIS — G119 Hereditary ataxia, unspecified: Secondary | ICD-10-CM

## 2016-01-11 DIAGNOSIS — R260 Ataxic gait: Secondary | ICD-10-CM

## 2016-01-12 ENCOUNTER — Inpatient Hospital Stay (HOSPITAL_COMMUNITY)
Admission: EM | Admit: 2016-01-12 | Discharge: 2016-01-28 | DRG: 056 | Disposition: A | Discharge status: Hospice | Payer: BLUE CROSS/BLUE SHIELD | Attending: Internal Medicine | Admitting: Internal Medicine

## 2016-01-12 ENCOUNTER — Telehealth: Payer: Self-pay | Admitting: Neurology

## 2016-01-12 ENCOUNTER — Encounter (HOSPITAL_COMMUNITY): Payer: Self-pay

## 2016-01-12 DIAGNOSIS — G3189 Other specified degenerative diseases of nervous system: Secondary | ICD-10-CM | POA: Diagnosis not present

## 2016-01-12 DIAGNOSIS — Z87891 Personal history of nicotine dependence: Secondary | ICD-10-CM

## 2016-01-12 DIAGNOSIS — G9349 Other encephalopathy: Secondary | ICD-10-CM | POA: Diagnosis present

## 2016-01-12 DIAGNOSIS — R112 Nausea with vomiting, unspecified: Secondary | ICD-10-CM

## 2016-01-12 DIAGNOSIS — R531 Weakness: Secondary | ICD-10-CM

## 2016-01-12 DIAGNOSIS — A6000 Herpesviral infection of urogenital system, unspecified: Secondary | ICD-10-CM | POA: Diagnosis present

## 2016-01-12 DIAGNOSIS — R269 Unspecified abnormalities of gait and mobility: Secondary | ICD-10-CM

## 2016-01-12 DIAGNOSIS — W19XXXA Unspecified fall, initial encounter: Secondary | ICD-10-CM

## 2016-01-12 DIAGNOSIS — R509 Fever, unspecified: Secondary | ICD-10-CM

## 2016-01-12 DIAGNOSIS — Z79899 Other long term (current) drug therapy: Secondary | ICD-10-CM

## 2016-01-12 DIAGNOSIS — F909 Attention-deficit hyperactivity disorder, unspecified type: Secondary | ICD-10-CM | POA: Diagnosis present

## 2016-01-12 DIAGNOSIS — Z85828 Personal history of other malignant neoplasm of skin: Secondary | ICD-10-CM

## 2016-01-12 DIAGNOSIS — R270 Ataxia, unspecified: Secondary | ICD-10-CM

## 2016-01-12 DIAGNOSIS — Z9221 Personal history of antineoplastic chemotherapy: Secondary | ICD-10-CM

## 2016-01-12 DIAGNOSIS — Z515 Encounter for palliative care: Secondary | ICD-10-CM | POA: Diagnosis not present

## 2016-01-12 DIAGNOSIS — G119 Hereditary ataxia, unspecified: Secondary | ICD-10-CM | POA: Diagnosis present

## 2016-01-12 DIAGNOSIS — F329 Major depressive disorder, single episode, unspecified: Secondary | ICD-10-CM | POA: Diagnosis present

## 2016-01-12 DIAGNOSIS — R29818 Other symptoms and signs involving the nervous system: Secondary | ICD-10-CM | POA: Diagnosis present

## 2016-01-12 DIAGNOSIS — R451 Restlessness and agitation: Secondary | ICD-10-CM | POA: Diagnosis not present

## 2016-01-12 DIAGNOSIS — E876 Hypokalemia: Secondary | ICD-10-CM | POA: Diagnosis present

## 2016-01-12 DIAGNOSIS — Z66 Do not resuscitate: Secondary | ICD-10-CM | POA: Diagnosis not present

## 2016-01-12 DIAGNOSIS — R471 Dysarthria and anarthria: Secondary | ICD-10-CM

## 2016-01-12 DIAGNOSIS — Z8249 Family history of ischemic heart disease and other diseases of the circulatory system: Secondary | ICD-10-CM

## 2016-01-12 DIAGNOSIS — C8590 Non-Hodgkin lymphoma, unspecified, unspecified site: Secondary | ICD-10-CM

## 2016-01-12 DIAGNOSIS — R159 Full incontinence of feces: Secondary | ICD-10-CM | POA: Diagnosis present

## 2016-01-12 DIAGNOSIS — R32 Unspecified urinary incontinence: Secondary | ICD-10-CM | POA: Diagnosis present

## 2016-01-12 DIAGNOSIS — R278 Other lack of coordination: Secondary | ICD-10-CM

## 2016-01-12 DIAGNOSIS — R400 Somnolence: Secondary | ICD-10-CM

## 2016-01-12 DIAGNOSIS — M25552 Pain in left hip: Secondary | ICD-10-CM

## 2016-01-12 DIAGNOSIS — F40240 Claustrophobia: Secondary | ICD-10-CM | POA: Diagnosis present

## 2016-01-12 MED ORDER — SODIUM CHLORIDE 0.9 % IV BOLUS (SEPSIS)
1000.0000 mL | Freq: Once | INTRAVENOUS | Status: AC
  Administered 2016-01-13: 1000 mL via INTRAVENOUS

## 2016-01-12 NOTE — ED Notes (Signed)
Pt also reports that her husband recently passed away. She states that it is a recent stressor.

## 2016-01-12 NOTE — ED Notes (Signed)
No

## 2016-01-12 NOTE — ED Notes (Signed)
Pt reports last chemo treatment was in January 2017.

## 2016-01-12 NOTE — ED Notes (Signed)
Notified RN,Jake to draw labs.

## 2016-01-12 NOTE — ED Triage Notes (Signed)
Pt BIB [facility]. Per mother, pt is having and increase in weakness and difficulty with speech and mobility over the last 3 weeks. Pt has a hx of non hodgkins lymphoma. Family contacted pt's neurologist today and he advised that she come in this evening.

## 2016-01-12 NOTE — Telephone Encounter (Signed)
Patient's mother is calling. She states the patient could not go through with the MRI with contrast yesterday. She was screaming and claustrophobic. The patient's condition has worsened.  She is not eating, is throwing up and she fell sometime during the night and hurt her knee and side. Patient's mother wants Dr. Leonie Man to have the patient admitted to the hospital and does not want to go to the ER.  Please call and discuss.

## 2016-01-12 NOTE — Telephone Encounter (Signed)
I called both Raliegh Ip as well as [employer] listed numbers and got no reply and left message on answering machine to call me back to discuss her concerns.

## 2016-01-12 NOTE — ED Notes (Signed)
MD At bedside

## 2016-01-13 ENCOUNTER — Emergency Department (HOSPITAL_COMMUNITY): Payer: BLUE CROSS/BLUE SHIELD

## 2016-01-13 ENCOUNTER — Encounter (HOSPITAL_COMMUNITY): Payer: Self-pay | Admitting: Internal Medicine

## 2016-01-13 ENCOUNTER — Observation Stay (HOSPITAL_COMMUNITY): Payer: BLUE CROSS/BLUE SHIELD

## 2016-01-13 DIAGNOSIS — R269 Unspecified abnormalities of gait and mobility: Secondary | ICD-10-CM | POA: Diagnosis not present

## 2016-01-13 DIAGNOSIS — R270 Ataxia, unspecified: Secondary | ICD-10-CM | POA: Diagnosis present

## 2016-01-13 DIAGNOSIS — R451 Restlessness and agitation: Secondary | ICD-10-CM | POA: Diagnosis not present

## 2016-01-13 DIAGNOSIS — R471 Dysarthria and anarthria: Secondary | ICD-10-CM | POA: Diagnosis not present

## 2016-01-13 DIAGNOSIS — Z9221 Personal history of antineoplastic chemotherapy: Secondary | ICD-10-CM | POA: Diagnosis not present

## 2016-01-13 DIAGNOSIS — R000 Tachycardia, unspecified: Secondary | ICD-10-CM | POA: Diagnosis not present

## 2016-01-13 DIAGNOSIS — C8580 Other specified types of non-Hodgkin lymphoma, unspecified site: Secondary | ICD-10-CM | POA: Diagnosis not present

## 2016-01-13 DIAGNOSIS — R4182 Altered mental status, unspecified: Secondary | ICD-10-CM | POA: Diagnosis not present

## 2016-01-13 DIAGNOSIS — F40240 Claustrophobia: Secondary | ICD-10-CM | POA: Diagnosis present

## 2016-01-13 DIAGNOSIS — A6000 Herpesviral infection of urogenital system, unspecified: Secondary | ICD-10-CM | POA: Diagnosis present

## 2016-01-13 DIAGNOSIS — G119 Hereditary ataxia, unspecified: Secondary | ICD-10-CM | POA: Diagnosis present

## 2016-01-13 DIAGNOSIS — R32 Unspecified urinary incontinence: Secondary | ICD-10-CM | POA: Diagnosis present

## 2016-01-13 DIAGNOSIS — M25552 Pain in left hip: Secondary | ICD-10-CM | POA: Insufficient documentation

## 2016-01-13 DIAGNOSIS — F329 Major depressive disorder, single episode, unspecified: Secondary | ICD-10-CM | POA: Diagnosis present

## 2016-01-13 DIAGNOSIS — W19XXXA Unspecified fall, initial encounter: Secondary | ICD-10-CM | POA: Diagnosis not present

## 2016-01-13 DIAGNOSIS — R531 Weakness: Secondary | ICD-10-CM

## 2016-01-13 DIAGNOSIS — R159 Full incontinence of feces: Secondary | ICD-10-CM | POA: Diagnosis present

## 2016-01-13 DIAGNOSIS — Z85828 Personal history of other malignant neoplasm of skin: Secondary | ICD-10-CM | POA: Diagnosis not present

## 2016-01-13 DIAGNOSIS — G3189 Other specified degenerative diseases of nervous system: Secondary | ICD-10-CM | POA: Diagnosis present

## 2016-01-13 DIAGNOSIS — G9349 Other encephalopathy: Secondary | ICD-10-CM | POA: Diagnosis present

## 2016-01-13 DIAGNOSIS — R410 Disorientation, unspecified: Secondary | ICD-10-CM | POA: Diagnosis not present

## 2016-01-13 DIAGNOSIS — O864 Pyrexia of unknown origin following delivery: Secondary | ICD-10-CM | POA: Diagnosis not present

## 2016-01-13 DIAGNOSIS — E876 Hypokalemia: Secondary | ICD-10-CM | POA: Diagnosis present

## 2016-01-13 DIAGNOSIS — F909 Attention-deficit hyperactivity disorder, unspecified type: Secondary | ICD-10-CM | POA: Diagnosis present

## 2016-01-13 DIAGNOSIS — Z515 Encounter for palliative care: Secondary | ICD-10-CM | POA: Diagnosis not present

## 2016-01-13 DIAGNOSIS — Z79899 Other long term (current) drug therapy: Secondary | ICD-10-CM | POA: Diagnosis not present

## 2016-01-13 DIAGNOSIS — R509 Fever, unspecified: Secondary | ICD-10-CM | POA: Diagnosis not present

## 2016-01-13 DIAGNOSIS — R278 Other lack of coordination: Secondary | ICD-10-CM | POA: Diagnosis not present

## 2016-01-13 DIAGNOSIS — R29818 Other symptoms and signs involving the nervous system: Secondary | ICD-10-CM | POA: Diagnosis present

## 2016-01-13 DIAGNOSIS — Z87891 Personal history of nicotine dependence: Secondary | ICD-10-CM | POA: Diagnosis not present

## 2016-01-13 DIAGNOSIS — Z8249 Family history of ischemic heart disease and other diseases of the circulatory system: Secondary | ICD-10-CM | POA: Diagnosis not present

## 2016-01-13 DIAGNOSIS — Z66 Do not resuscitate: Secondary | ICD-10-CM | POA: Diagnosis not present

## 2016-01-13 LAB — CBC
HCT: 35.5 % — ABNORMAL LOW (ref 36.0–46.0)
HEMOGLOBIN: 12.1 g/dL (ref 12.0–15.0)
MCH: 30.5 pg (ref 26.0–34.0)
MCHC: 34.1 g/dL (ref 30.0–36.0)
MCV: 89.4 fL (ref 78.0–100.0)
PLATELETS: 261 10*3/uL (ref 150–400)
RBC: 3.97 MIL/uL (ref 3.87–5.11)
RDW: 13 % (ref 11.5–15.5)
WBC: 5.6 10*3/uL (ref 4.0–10.5)

## 2016-01-13 LAB — RAPID URINE DRUG SCREEN, HOSP PERFORMED
Amphetamines: NOT DETECTED
BARBITURATES: NOT DETECTED
BENZODIAZEPINES: POSITIVE — AB
Cocaine: NOT DETECTED
Opiates: NOT DETECTED
Tetrahydrocannabinol: NOT DETECTED

## 2016-01-13 LAB — COMPREHENSIVE METABOLIC PANEL
ALBUMIN: 4.3 g/dL (ref 3.5–5.0)
ALK PHOS: 82 U/L (ref 38–126)
ALK PHOS: 88 U/L (ref 38–126)
ALT: 16 U/L (ref 14–54)
ALT: 17 U/L (ref 14–54)
ANION GAP: 5 (ref 5–15)
AST: 23 U/L (ref 15–41)
AST: 21 U/L (ref 15–41)
Albumin: 3.9 g/dL (ref 3.5–5.0)
Anion gap: 8 (ref 5–15)
BILIRUBIN TOTAL: 0.7 mg/dL (ref 0.3–1.2)
BILIRUBIN TOTAL: 0.5 mg/dL (ref 0.3–1.2)
BUN: 8 mg/dL (ref 6–20)
BUN: 10 mg/dL (ref 6–20)
CALCIUM: 9.4 mg/dL (ref 8.9–10.3)
CALCIUM: 9.3 mg/dL (ref 8.9–10.3)
CO2: 28 mmol/L (ref 22–32)
CO2: 25 mmol/L (ref 22–32)
CREATININE: 0.53 mg/dL (ref 0.44–1.00)
CREATININE: 0.56 mg/dL (ref 0.44–1.00)
Chloride: 103 mmol/L (ref 101–111)
Chloride: 106 mmol/L (ref 101–111)
GFR calc Af Amer: >60 mL/min (ref >60)
GFR calc non Af Amer: >60 mL/min (ref >60)
GFR calc non Af Amer: >60 mL/min (ref >60)
GLUCOSE: 105 mg/dL — AB (ref 65–99)
GLUCOSE: 110 mg/dL — AB (ref 65–99)
Potassium: 3.7 mmol/L (ref 3.5–5.1)
Potassium: 4 mmol/L (ref 3.5–5.1)
SODIUM: 136 mmol/L (ref 135–145)
Sodium: 139 mmol/L (ref 135–145)
TOTAL PROTEIN: 6.6 g/dL (ref 6.5–8.1)
TOTAL PROTEIN: 6.8 g/dL (ref 6.5–8.1)

## 2016-01-13 LAB — CSF CELL COUNT WITH DIFFERENTIAL
RBC COUNT CSF: 1510 /mm3 — AB
Tube #: 1
WBC CSF: 4 /mm3 (ref 0–5)

## 2016-01-13 LAB — CBC WITH DIFFERENTIAL/PLATELET
BASOS PCT: 0 %
Basophils Absolute: 0 10*3/uL (ref 0.0–0.1)
EOS ABS: 0.2 10*3/uL (ref 0.0–0.7)
Eosinophils Relative: 4 %
HEMATOCRIT: 38.5 % (ref 36.0–46.0)
HEMOGLOBIN: 12.7 g/dL (ref 12.0–15.0)
Lymphocytes Relative: 15 %
Lymphs Abs: 0.8 10*3/uL (ref 0.7–4.0)
MCH: 29.7 pg (ref 26.0–34.0)
MCHC: 33 g/dL (ref 30.0–36.0)
MCV: 90.2 fL (ref 78.0–100.0)
MONOS PCT: 11 %
Monocytes Absolute: 0.6 10*3/uL (ref 0.1–1.0)
NEUTROS ABS: 3.9 10*3/uL (ref 1.7–7.7)
NEUTROS PCT: 70 %
Platelets: 285 10*3/uL (ref 150–400)
RBC: 4.27 MIL/uL (ref 3.87–5.11)
RDW: 13.4 % (ref 11.5–15.5)
WBC: 5.6 10*3/uL (ref 4.0–10.5)

## 2016-01-13 LAB — URINE MICROSCOPIC-ADD ON: RBC / HPF: NONE SEEN RBC/hpf (ref 0–5)

## 2016-01-13 LAB — URINALYSIS, ROUTINE W REFLEX MICROSCOPIC
BILIRUBIN URINE: NEGATIVE
Glucose, UA: NEGATIVE mg/dL
Hgb urine dipstick: NEGATIVE
Ketones, ur: NEGATIVE mg/dL
NITRITE: NEGATIVE
PH: 8 (ref 5.0–8.0)
Protein, ur: NEGATIVE mg/dL
SPECIFIC GRAVITY, URINE: 1.015 (ref 1.005–1.030)

## 2016-01-13 LAB — CRYPTOCOCCAL ANTIGEN, CSF: Crypto Ag: NEGATIVE

## 2016-01-13 LAB — PROTEIN AND GLUCOSE, CSF
GLUCOSE CSF: 64 mg/dL (ref 40–70)
TOTAL PROTEIN, CSF: 39 mg/dL (ref 15–45)

## 2016-01-13 MED ORDER — LIDOCAINE-EPINEPHRINE 2 %-1:100000 IJ SOLN
20.0000 mL | Freq: Once | INTRAMUSCULAR | Status: AC
  Administered 2016-01-13: 3 mL
  Filled 2016-01-13: qty 1

## 2016-01-13 MED ORDER — ENOXAPARIN SODIUM 40 MG/0.4ML ~~LOC~~ SOLN
40.0000 mg | SUBCUTANEOUS | Status: DC
  Filled 2016-01-13: qty 0.4

## 2016-01-13 MED ORDER — DIPHENHYDRAMINE HCL 25 MG PO CAPS
25.0000 mg | ORAL_CAPSULE | Freq: Four times a day (QID) | ORAL | Status: AC | PRN
  Administered 2016-01-13 – 2016-01-14 (×2): 25 mg via ORAL
  Filled 2016-01-13 (×2): qty 1

## 2016-01-13 MED ORDER — ACETAMINOPHEN 325 MG PO TABS
650.0000 mg | ORAL_TABLET | Freq: Four times a day (QID) | ORAL | Status: DC | PRN
  Administered 2016-01-13 – 2016-01-20 (×11): 650 mg via ORAL
  Filled 2016-01-13 (×11): qty 2

## 2016-01-13 MED ORDER — POTASSIUM CHLORIDE CRYS ER 20 MEQ PO TBCR
20.0000 meq | EXTENDED_RELEASE_TABLET | Freq: Every day | ORAL | Status: DC
  Administered 2016-01-14 – 2016-01-26 (×13): 20 meq via ORAL
  Filled 2016-01-13 (×13): qty 1

## 2016-01-13 MED ORDER — VITAMIN D (ERGOCALCIFEROL) 1.25 MG (50000 UNIT) PO CAPS
50000.0000 [IU] | ORAL_CAPSULE | ORAL | Status: DC
  Administered 2016-01-20: 50000 [IU] via ORAL
  Filled 2016-01-13 (×3): qty 1

## 2016-01-13 MED ORDER — LIDOCAINE HCL 2 % IJ SOLN
INTRAMUSCULAR | Status: AC
  Filled 2016-01-13: qty 20

## 2016-01-13 MED ORDER — ACETAMINOPHEN 650 MG RE SUPP
650.0000 mg | Freq: Four times a day (QID) | RECTAL | Status: DC | PRN
  Administered 2016-01-21: 650 mg via RECTAL
  Filled 2016-01-13: qty 1

## 2016-01-13 MED ORDER — SODIUM CHLORIDE 0.9% FLUSH
3.0000 mL | Freq: Two times a day (BID) | INTRAVENOUS | Status: DC
  Administered 2016-01-13 – 2016-01-28 (×19): 3 mL via INTRAVENOUS

## 2016-01-13 MED ORDER — LORAZEPAM 1 MG PO TABS
1.0000 mg | ORAL_TABLET | Freq: Three times a day (TID) | ORAL | Status: DC | PRN
  Administered 2016-01-13 – 2016-01-15 (×6): 1 mg via ORAL
  Filled 2016-01-13 (×6): qty 1

## 2016-01-13 MED ORDER — SODIUM CHLORIDE 0.9 % IV SOLN
INTRAVENOUS | Status: AC
  Administered 2016-01-13: 07:00:00 via INTRAVENOUS

## 2016-01-13 NOTE — H&P (Addendum)
TRH H&P   Patient Demographics:    Grasiela Hussong, is a 45 y.o. female  MRN: [id number]   DOB - 24-Jun-1970  Admit Date - 01/12/2016  Outpatient Primary MD for the patient is Tammi Sou, MD  Referring MD/NP/PA:  Rod Holler  Outpatient Specialists: Ulyses Jarred  Patient coming from: home  Chief Complaint  Patient presents with  . Weakness      HPI:    Rikki-Lee Corcoran  is a 45 y.o. female, w difficulty with walking as well as generalized weakness x 4 weeks pt has been having increase in falls. Pt has more weakness in her lower ext than upper ext.  Pt denies neck pain but has pain in the thoracic spine, denies lower back pain.   Pt was told by Dr. Clydene Fake office to go to ER  In ED, pt had MRI brain=> no acute process.  Left hip pain,  Xray => negative for fracture.  Neurology consulted.     Review of systems:    In addition to the HPI above,  No Fever-chills, No Headache, No changes with Vision or hearing, No problems swallowing food or Liquids, No Chest pain, Cough or Shortness of Breath, No Abdominal pain, No Nausea or Vommitting, Bowel movements are regular, No Blood in stool or Urine, No dysuria, No new skin rashes or bruises,  No new weakness, tingling, numbness in any extremity, No recent weight gain or loss, No polyuria, polydypsia or polyphagia, No significant Mental Stressors.  A full 10 point Review of Systems was done, except as stated above, all other Review of Systems were negative.   With Past History of the following :    Past Medical History:  Diagnosis Date  . Adult ADHD   . Anxiety and depression   . 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 Leonie Man.  Paraneoplastic lab panel pending.  Marland Kitchen HSV-2 (herpes simplex virus 2) infection   . Menorrhagia    Regular: 7 days of bleeding (2 heavy and 5 light).  . 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  Recurrence x 2 after the birth of each of her children--different chemo regimen used.  Recurrence again spring 2016-in the mid/upper back, and as of 10/2015 PET scan showed no sign of dz.  Dr. Marin Olp wants her on maintenance Gavyza to prevent recurrence but as of 12/27/15 pt has not chosen to do this yet.  . Tobacco dependence in remission       Past Surgical History:  Procedure Laterality Date  . BASAL CELL CARCINOMA EXCISION    . COLONOSCOPY  appox age [age] yrs   Normal  . ENDOMETRIAL ABLATION  06/2008  . HYSTEROSCOPY  08/2007   polyp  . TUBAL LIGATION        Social History:  Social History  Substance Use Topics  . Smoking status: Former Smoker    Packs/day: 0.50    Years: 10.00    Types: Cigarettes    Quit date: 09/08/2012  . Smokeless tobacco: Never Used     Comment: Quit smoking 2 years ago  . Alcohol use No     Lives - at home  Mobility -    Family History :     Family History  Problem Relation Age of Onset  . Hyperlipidemia Mother   . CAD Father   . Alcohol abuse Maternal Grandfather   . Cancer Paternal Grandmother   . Heart disease Paternal Grandfather   . Cancer Paternal Uncle     colon      Home Medications:   Prior to Admission medications   Medication Sig Start Date End Date Taking? Authorizing Provider  ergocalciferol (VITAMIN D2) 50000 UNITS capsule Take 1 capsule (50,000 Units total) by mouth once a week. 04/19/15  Yes Volanda Napoleon, MD  LORazepam (ATIVAN) 1 MG tablet Take 1 tablet (1 mg total) by mouth every 8 (eight) hours. Patient taking differently: Take 1 mg by mouth every 8 (eight) hours as needed for anxiety.  11/24/15  Yes Volanda Napoleon, MD  potassium chloride SA (K-DUR,KLOR-CON) 20 MEQ tablet One tablet once every three days. 10/26/15  Yes Volanda Napoleon, MD     Allergies:    No Known Allergies   Physical Exam:   Vitals  Blood pressure  (!) 106/44, pulse 85, temperature 98.2 F (36.8 C), temperature source Oral, resp. rate 24, SpO2 96 %.   1. General  lying in bed in NAD,    2. Normal affect and insight, Not Suicidal or Homicidal, Awake Alert, Oriented X 3.  3. No F.N deficits, ALL C.Nerves Intact, Strength 5/5 all 4 extremities, Sensation intact all 4 extremities, Plantars down going.  Patellar reflexes slightly hyper reflexive  4. Ears and Eyes appear Normal, Conjunctivae clear, PERRLA. Moist Oral Mucosa.  5. Supple Neck, No JVD, No cervical lymphadenopathy appriciated, No Carotid Bruits.  6. Symmetrical Chest wall movement, Good air movement bilaterally, CTAB.  7. RRR, No Gallops, Rubs or Murmurs, No Parasternal Heave.  8. Positive Bowel Sounds, Abdomen Soft, No tenderness, No organomegaly appriciated,No rebound -guarding or rigidity.  9.  No Cyanosis, Normal Skin Turgor, No Skin Rash or Bruise.  10. Good muscle tone,  joints appear normal , no effusions, Normal ROM.  11. No Palpable Lymph Nodes in Neck or Axillae    Data Review:    CBC  Recent Labs Lab 01/13/16 0015  WBC 5.6  HGB 12.7  HCT 38.5  PLT 285  MCV 90.2  MCH 29.7  MCHC 33.0  RDW 13.4  LYMPHSABS 0.8  MONOABS 0.6  EOSABS 0.2  BASOSABS 0.0   ------------------------------------------------------------------------------------------------------------------  Chemistries   Recent Labs Lab 01/13/16 0015  NA 136  K 3.7  CL 103  CO2 25  GLUCOSE 110*  BUN 10  CREATININE 0.56  CALCIUM 9.3  AST 21  ALT 17  ALKPHOS 88  BILITOT 0.5   ------------------------------------------------------------------------------------------------------------------ estimated creatinine clearance is 70.7 mL/min (by C-G formula based on SCr of 0.8 mg/dL). ------------------------------------------------------------------------------------------------------------------ No results for input(s): TSH, T4TOTAL, T3FREE, THYROIDAB in the last 72  hours.  Invalid input(s): FREET3  Coagulation profile No results for input(s): INR, PROTIME in the last 168 hours. ------------------------------------------------------------------------------------------------------------------- No results for input(s): DDIMER in the last 72 hours. -------------------------------------------------------------------------------------------------------------------  Cardiac Enzymes No results for input(s): CKMB,  TROPONINI, MYOGLOBIN in the last 168 hours.  Invalid input(s): CK ------------------------------------------------------------------------------------------------------------------ No results found for: BNP   ---------------------------------------------------------------------------------------------------------------  Urinalysis    Component Value Date/Time   COLORURINE YELLOW 01/12/2016 2348   APPEARANCEUR TURBID (A) 01/12/2016 2348   LABSPEC 1.015 01/12/2016 2348   PHURINE 8.0 01/12/2016 2348   GLUCOSEU NEGATIVE 01/12/2016 2348   HGBUR NEGATIVE 01/12/2016 2348   BILIRUBINUR NEGATIVE 01/12/2016 2348   KETONESUR NEGATIVE 01/12/2016 2348   PROTEINUR NEGATIVE 01/12/2016 2348   NITRITE NEGATIVE 01/12/2016 2348   LEUKOCYTESUR TRACE (A) 01/12/2016 2348    ----------------------------------------------------------------------------------------------------------------   Imaging Results:    Ct Head Wo Contrast  Result Date: 01/13/2016 CLINICAL DATA:  45 y/o F; increased weakness and difficulty with speech mobility over the last 3 weeks. History of non-Hodgkin's lymphoma. EXAM: CT HEAD WITHOUT CONTRAST TECHNIQUE: Contiguous axial images were obtained from the base of the skull through the vertex without intravenous contrast. COMPARISON:  MRI of the brain dated 12/20/2015. FINDINGS: Brain: No evidence of acute infarction, hemorrhage, hydrocephalus, extra-axial collection or mass lesion/mass effect. Moderate generalized atrophy for patient's  age. Few lucencies in white matter and within the pons corresponding FLAIR signal abnormality on prior MRI and are nonspecific. Vascular: No hyperdense vessel or unexpected calcification. Skull: No fracture is identified.  No suspicious lesion. Sinuses/Orbits: No acute finding. Other: No unexpected finding. IMPRESSION: No acute intracranial abnormality is identified. Stable generalized volume loss and mild nonspecific white matter lucencies possibly representing microvascular disease or posttreatment changes. Electronically Signed   By: Kristine Garbe M.D.   On: 01/13/2016 00:46      Assessment & Plan:    Principal Problem:   Weakness    1. Weakness ? Paraneoplastic syndrome.  Pt declines MRI at the present time of the cervical , thoracic, lumbar spine.  Consider LP Neurology consulted. Appreciate input.   L hip pain Xray pending  DVT Prophylaxis Lovenox - SCDs  AM Labs Ordered, also please review Full Orders  Family Communication: Admission, patients condition and plan of care including tests being ordered have been discussed with the patient  who indicate understanding and agree with the plan and Code Status.  Code Status FULL CODE  Likely DC to  home  Condition GUARDED    Consults called:   Admission status: observation  Time spent in minutes : 45 minutes   Jani Gravel M.D on 01/13/2016 at 1:56 AM  Between 7am to 7pm - Pager - [phone] After 7pm go to [email or web address] - password [facility]  [facility] - Office  [phone]

## 2016-01-13 NOTE — Progress Notes (Signed)
Received report from Mickel Baas, [place] from [place] about pt. Pt arrived on unit. Pt alert and oriented to unit. Visitors at bedside.

## 2016-01-13 NOTE — ED Notes (Signed)
[phone] Patient's mother would like to be called.

## 2016-01-13 NOTE — Consult Note (Signed)
NEURO HOSPITALIST CONSULT NOTE   Requestig physician: Dr. Doyle Askew   Reason for Consult: LE weakness   History obtained from:  Patient   Chart  *  HPI:                                                                                                                                          [name] is an 45 y.o. female who was recently followed by Dr. Leonie Man of [facility] on 12/30/2015 for ataxia along with other multitude of problems.. At that time per note, she had had significant problems with walking, balance as well as speech for last 2-3 weeks. Per Verlin Fester she had been seen [facility]" emergency room on 11/2415 for dehydration. She had stated that she felt uncoordinated and would fall if not using her walker or getting assistance from her mother. In addition she stated that her shoulders and hands felt numb but her feet  did not feel numb. She also complained of often feeling confused, speech eating slow and at times slurred, ongoing feeling of severe sadness and depression to which she is on Zoloft by her PCP.  She had lab work done by primary physician including vitamin B12 and TSH 6 weeks ago both of which were normal. She has history of Cutaneous B cell lymphoma. Initial dx age 43--treated with chemo. Recurrence x 2 after the birth of each of her children--different chemo regimen used. Recurrence again spring 2016-in the mid/upper back, and as of 10/2015 PET scan showed no sign of dz. she was treated with 4 cycles of Gazyva/Treanda and oncology MD wants her on maintenance  Gazyva but the patient is thinking about it.  At that office visit the differential included possible side effects from chemotherapy versus paraneoplastic effect of her non-Hodgkin's lymphoma. She is encouraged to use a wheeled walker at all times and referred to physical and occupational therapy for gait and balance. Plan was to check a paraneoplastic antibody panel and MRI scan of the brain and  cervical spine. Looking in the chart paraneoplastic panel has not been collected at this time.  In addition MRI brain was not obtained secondary to claustrophobia.  Per chart patient's mother had called [facility] stating that the patient was not eating, throwing up and fell sometime during the night hurting her knee and side. Mother was requesting Dr. Leonie Man to have patient admitted to the hospital for further evaluation. Patient arrived at the ER at [facility] on 01/13/2016 for further evaluation by neurology.  In speaking with the mother and patient while in the room, mother states that over the last couple weeks she isa decline in the patient's ability to use her hands to hold onto her walker. She is also noted she's had  some slurred speech and some difficulty taking her calcium pill. She denies any problems eating food or drinking water. Patient's mother is worried due to the fact that she is noted that the patient has had some memory issues. This is described as not recalling the EMS ride [facility] to [facility], however 20 minutes later she is able to recall the whole thing. The mother does state that she's been under significant amount of stressors over the past few months due to her husband's death, and does feel that this is playing a role in what is going on, however wants to make sure there is nothing else that she is missing.  Past Medical History:  Diagnosis Date  . Adult ADHD   . Anxiety and depression   . 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 Leonie Man.  Paraneoplastic lab panel pending.  Marland Kitchen HSV-2 (herpes simplex virus 2) infection   . Menorrhagia    Regular: 7 days of bleeding (2 heavy and 5 light).  . Non-Hodgkin's lymphoma of skin ([place])    Cutaneous B cell lymphoma.  Initial dx age 22--treated with chemo.  Recurrence x 2 after the birth of each of her children--different chemo regimen used.  Recurrence again spring 2016-in the mid/upper back, and as  of 10/2015 PET scan showed no sign of dz.  Dr. Marin Olp wants her on maintenance Gavyza to prevent recurrence but as of 12/27/15 pt has not chosen to do this yet.  . Tobacco dependence in remission     Past Surgical History:  Procedure Laterality Date  . BASAL CELL CARCINOMA EXCISION    . COLONOSCOPY  appox age 50-37 yrs   Normal  . ENDOMETRIAL ABLATION  06/2008  . HYSTEROSCOPY  08/2007   polyp  . TUBAL LIGATION      Family History  Problem Relation Age of Onset  . Hyperlipidemia Mother   . CAD Father   . Alcohol abuse Maternal Grandfather   . Cancer Paternal Grandmother   . Heart disease Paternal Grandfather   . Cancer Paternal Uncle     colon     Social History:  reports that she quit smoking about 3 years ago. Her smoking use included Cigarettes. She has a 5.00 pack-year smoking history. She has never used smokeless tobacco. She reports that she does not drink alcohol or use drugs.  No Known Allergies  MEDICATIONS:                                                                                                                     Prior to Admission:  (Not in a hospital admission) Scheduled: . potassium chloride SA  20 mEq Oral Daily  . sodium chloride flush  3 mL Intravenous Q12H  . Vitamin D (Ergocalciferol)  50,000 Units Oral Weekly     ROS:  History obtained from the patient  General ROS: negative for - chills, fatigue, fever, night sweats, weight gain or weight loss Psychological ROS: negative for - behavioral disorder, hallucinations, memory difficulties, mood swings or suicidal ideation Ophthalmic ROS: negative for - blurry vision, double vision, eye pain or loss of vision ENT ROS: negative for - epistaxis, nasal discharge, oral lesions, sore throat, tinnitus or vertigo Allergy and Immunology ROS: negative for - hives or itchy/watery  eyes Hematological and Lymphatic ROS: negative for - bleeding problems, bruising or swollen lymph nodes Endocrine ROS: negative for - galactorrhea, hair pattern changes, polydipsia/polyuria or temperature intolerance Respiratory ROS: negative for - cough, hemoptysis, shortness of breath or wheezing Cardiovascular ROS: negative for - chest pain, dyspnea on exertion, edema or irregular heartbeat Gastrointestinal ROS: negative for - abdominal pain, diarrhea, hematemesis, nausea/vomiting or stool incontinence Genito-Urinary ROS: negative for - dysuria, hematuria, incontinence or urinary frequency/urgency Musculoskeletal ROS: negative for - joint swelling or muscular weakness Neurological ROS: as noted in HPI Dermatological ROS: negative for rash and skin lesion changes   Blood pressure 101/59, pulse 82, temperature 98.2 F (36.8 C), temperature source Oral, resp. rate 16, SpO2 99 %.   Neurologic Examination:                                                                                                      HEENT-  Normocephalic, no lesions, without obvious abnormality.  Normal external eye and conjunctiva.  Normal TM's bilaterally.  Normal auditory canals and external ears. Normal external nose, mucus membranes and septum.  Normal pharynx. Cardiovascular- S1, S2 normal, pulses palpable throughout   Lungs- chest clear, no wheezing, rales, normal symmetric air entry, Heart exam - S1, S2 normal, no murmur, no gallop, rate regular Abdomen- normal findings: bowel sounds normal Extremities- no edema Lymph-no adenopathy palpable Musculoskeletal-no joint tenderness, deformity or swelling Skin-warm and dry, no hyperpigmentation, vitiligo, or suspicious lesions  Neurological Examination Mental Status: Alert, oriented, thought content appropriate.  Speech fluent, she is able to repeat, name objects, follow commands, and express herself without difficulty.  She is able to say LA LA, Ta Ta, mama, without  difficulty.  She is slightly confused at some of the request when asked to follow commands however given time she is able to do so with no difficulty.  Cranial Nerves: [id number] fields grossly normal, pupils equal, round, reactive to light and accommodation III,IV, VI: ptosis not present, extra-ocular motions intact bilaterally V,VII: smile symmetric, facial light touch sensation normal bilaterally VIII: hearing normal bilaterally IX,X: uvula rises symmetrically XI: bilateral shoulder shrug XII: midline tongue extension Motor: Right : Upper extremity   5/5    Left:     Upper extremity   5/5  Lower extremity   5/5     Lower extremity   5/5 Tone and bulk:normal tone throughout; no atrophy noted There is no increased rigidity throughout. When following commands her movements are somewhat spastic at times Sensory: Pinprick and light touch intact throughout, bilaterally Deep Tendon Reflexes: 2+ and symmetric throughout Plantars: Right: downgoing   Left: downgoing Cerebellar: normal  finger-to-nose and normal heel-to-shin test Gait: Not tested      Lab Results: Basic Metabolic Panel:  Recent Labs Lab 01/13/16 0015 01/13/16 0503  NA 136 139  K 3.7 4.0  CL 103 106  CO2 25 28  GLUCOSE 110* 105*  BUN 10 8  CREATININE 0.56 0.53  CALCIUM 9.3 9.4    Liver Function Tests:  Recent Labs Lab 01/13/16 0015 01/13/16 0503  AST 21 23  ALT 17 16  ALKPHOS 88 82  BILITOT 0.5 0.7  PROT 6.8 6.6  ALBUMIN 4.3 3.9   No results for input(s): LIPASE, AMYLASE in the last 168 hours. No results for input(s): AMMONIA in the last 168 hours.  CBC:  Recent Labs Lab 01/13/16 0015 01/13/16 0503  WBC 5.6 5.6  NEUTROABS 3.9  --   HGB 12.7 12.1  HCT 38.5 35.5*  MCV 90.2 89.4  PLT 285 261    Cardiac Enzymes: No results for input(s): CKTOTAL, CKMB, CKMBINDEX, TROPONINI in the last 168 hours.  Lipid Panel: No results for input(s): CHOL, TRIG, HDL, CHOLHDL, VLDL, LDLCALC in the last 168  hours.  CBG: No results for input(s): GLUCAP in the last 168 hours.  Microbiology: No results found for this or any previous visit.  Coagulation Studies: No results for input(s): LABPROT, INR in the last 72 hours.  Imaging: Ct Head Wo Contrast  Result Date: 01/13/2016 CLINICAL DATA:  45 y/o F; increased weakness and difficulty with speech mobility over the last 3 weeks. History of non-Hodgkin's lymphoma. EXAM: CT HEAD WITHOUT CONTRAST TECHNIQUE: Contiguous axial images were obtained from the base of the skull through the vertex without intravenous contrast. COMPARISON:  MRI of the brain dated 12/20/2015. FINDINGS: Brain: No evidence of acute infarction, hemorrhage, hydrocephalus, extra-axial collection or mass lesion/mass effect. Moderate generalized atrophy for patient's age. Few lucencies in white matter and within the pons corresponding FLAIR signal abnormality on prior MRI and are nonspecific. Vascular: No hyperdense vessel or unexpected calcification. Skull: No fracture is identified.  No suspicious lesion. Sinuses/Orbits: No acute finding. Other: No unexpected finding. IMPRESSION: No acute intracranial abnormality is identified. Stable generalized volume loss and mild nonspecific white matter lucencies possibly representing microvascular disease or posttreatment changes. Electronically Signed   By: Kristine Garbe M.D.   On: 01/13/2016 00:46   Dg Hip Unilat With Pelvis 2-3 Views Left  Result Date: 01/13/2016 CLINICAL DATA:  45 y/o  F; status post fall with anterior hip pain. EXAM: DG HIP (WITH OR WITHOUT PELVIS) 2-3V LEFT COMPARISON:  None. FINDINGS: There is no evidence of hip fracture or dislocation. There is no evidence of arthropathy or other focal bone abnormality. Clips project over the pelvis. IMPRESSION: No acute fracture or dislocation is identified. Electronically Signed   By: Kristine Garbe M.D.   On: 01/13/2016 02:07       Assessment and plan per attending  neurologist  Etta Quill PA-C [facility] [phone]  01/13/2016, 8:34 AM   Assessment/Plan: This is a 45 year old female with history of non-Hodgkin's lymphoma of the skin currently in remission. Over the past 4-6 weeks she has had increase in  lower extremity weakness, falls, and over the past 2 weeks mother states that she has noted some slurred speech in addition to difficulty recalling events. She has been seen as an outpatient by Dr. Leonie Man of [facility] for these issues. At that time the plan was to obtain a paraneoplastic panel in addition MRI of the brain. MRI of the brain was  attempted but not successful secondary to claustrophobia. Patient is currently a [facility] but will be transferred to [facility] in further evaluated by Dr. Leonel Ramsay. Further recommendations will follow.

## 2016-01-13 NOTE — ED Provider Notes (Signed)
Patient with progressively worsening weakness and bilateral legs and trouble speaking over the past 4 weeks. She's also been incontinent of urine and stool or no symptoms prior to coming here. She does walk with a walker. Denies fever. Denies pain anywhere. She had a few episodes of vomiting yesterday. On exam alert Glasgow Coma Score 15 HEENT exam no facial asymmetry speech is slightly slurred. Neck supple lungs clear auscultation heart regular rate and rhythm extremities without edema skin warm dry, no rash neurologic tenderness 2 through 12 intact moves all extremity as well motor strength 4/5 in all extremities DTR symmetric bilaterally at knee jerk ankle jerk and biceps toes downward going bilaterally Dr. Maudie Mercury consulted. Plan 23 hour observation telemetry. Suggest neurologic consult Results for orders placed or performed during the hospital encounter of 01/12/16  CBC with Differential  Result Value Ref Range   WBC 5.6 4.0 - 10.5 K/uL   RBC 4.27 3.87 - 5.11 MIL/uL   Hemoglobin 12.7 12.0 - 15.0 g/dL   HCT 38.5 36.0 - 46.0 %   MCV 90.2 78.0 - 100.0 fL   MCH 29.7 26.0 - 34.0 pg   MCHC 33.0 30.0 - 36.0 g/dL   RDW 13.4 11.5 - 15.5 %   Platelets 285 150 - 400 K/uL   Neutrophils Relative % 70 %   Neutro Abs 3.9 1.7 - 7.7 K/uL   Lymphocytes Relative 15 %   Lymphs Abs 0.8 0.7 - 4.0 K/uL   Monocytes Relative 11 %   Monocytes Absolute 0.6 0.1 - 1.0 K/uL   Eosinophils Relative 4 %   Eosinophils Absolute 0.2 0.0 - 0.7 K/uL   Basophils Relative 0 %   Basophils Absolute 0.0 0.0 - 0.1 K/uL  Urinalysis, Routine w reflex microscopic (not at [facility])  Result Value Ref Range   Color, Urine YELLOW YELLOW   APPearance TURBID (A) CLEAR   Specific Gravity, Urine 1.015 1.005 - 1.030   pH 8.0 5.0 - 8.0   Glucose, UA NEGATIVE NEGATIVE mg/dL   Hgb urine dipstick NEGATIVE NEGATIVE   Bilirubin Urine NEGATIVE NEGATIVE   Ketones, ur NEGATIVE NEGATIVE mg/dL   Protein, ur NEGATIVE NEGATIVE mg/dL   Nitrite NEGATIVE  NEGATIVE   Leukocytes, UA TRACE (A) NEGATIVE  Comprehensive metabolic panel  Result Value Ref Range   Sodium 136 135 - 145 mmol/L   Potassium 3.7 3.5 - 5.1 mmol/L   Chloride 103 101 - 111 mmol/L   CO2 25 22 - 32 mmol/L   Glucose, Bld 110 (H) 65 - 99 mg/dL   BUN 10 6 - 20 mg/dL   Creatinine, Ser 0.56 0.44 - 1.00 mg/dL   Calcium 9.3 8.9 - 10.3 mg/dL   Total Protein 6.8 6.5 - 8.1 g/dL   Albumin 4.3 3.5 - 5.0 g/dL   AST 21 15 - 41 U/L   ALT 17 14 - 54 U/L   Alkaline Phosphatase 88 38 - 126 U/L   Total Bilirubin 0.5 0.3 - 1.2 mg/dL   GFR calc non Af Amer >60 >60 mL/min   GFR calc Af Amer >60 >60 mL/min   Anion gap 8 5 - 15  Urine rapid drug screen (hosp performed)  Result Value Ref Range   Opiates NONE DETECTED NONE DETECTED   Cocaine NONE DETECTED NONE DETECTED   Benzodiazepines POSITIVE (A) NONE DETECTED   Amphetamines NONE DETECTED NONE DETECTED   Tetrahydrocannabinol NONE DETECTED NONE DETECTED   Barbiturates NONE DETECTED NONE DETECTED  Urine microscopic-add on  Result Value Ref  Range   Squamous Epithelial / LPF 0-5 (A) NONE SEEN   WBC, UA 0-5 0 - 5 WBC/hpf   RBC / HPF NONE SEEN 0 - 5 RBC/hpf   Bacteria, UA RARE (A) NONE SEEN   Urine-Other AMORPHOUS URATES/PHOSPHATES    Ct Head Wo Contrast  Result Date: 01/13/2016 CLINICAL DATA:  45 y/o F; increased weakness and difficulty with speech mobility over the last 3 weeks. History of non-Hodgkin's lymphoma. EXAM: CT HEAD WITHOUT CONTRAST TECHNIQUE: Contiguous axial images were obtained from the base of the skull through the vertex without intravenous contrast. COMPARISON:  MRI of the brain dated 12/20/2015. FINDINGS: Brain: No evidence of acute infarction, hemorrhage, hydrocephalus, extra-axial collection or mass lesion/mass effect. Moderate generalized atrophy for patient's age. Few lucencies in white matter and within the pons corresponding FLAIR signal abnormality on prior MRI and are nonspecific. Vascular: No hyperdense vessel or  unexpected calcification. Skull: No fracture is identified.  No suspicious lesion. Sinuses/Orbits: No acute finding. Other: No unexpected finding. IMPRESSION: No acute intracranial abnormality is identified. Stable generalized volume loss and mild nonspecific white matter lucencies possibly representing microvascular disease or posttreatment changes. Electronically Signed   By: Kristine Garbe M.D.   On: 01/13/2016 00:46   Mr Jeri Cos [id number] Contrast  Result Date: 12/20/2015 CLINICAL DATA:  Blurred vision with difficulty walking. History of non-Hodgkin's lymphoma and chemotherapy. EXAM: MRI HEAD WITHOUT AND WITH CONTRAST TECHNIQUE: Multiplanar, multiecho pulse sequences of the brain and surrounding structures were obtained without and with intravenous contrast. CONTRAST:  33mL MULTIHANCE GADOBENATE DIMEGLUMINE 529 MG/ML IV SOLN COMPARISON:  None. FINDINGS: Mild atrophy.  Mild ventricular enlargement consistent with atrophy. Negative for acute infarct. Scattered small hyperintensities in the deep white matter bilaterally and in the pons. This may be related to prior chemotherapy or chronic ischemia. Negative for intracranial hemorrhage.  No fluid collection Negative for mass or edema Postcontrast imaging demonstrates normal enhancement. No enhancing mass. Normal venous enhancement. Mild mucosal edema paranasal sinuses. Normal orbit. Pituitary not enlarged. IMPRESSION: No acute intracranial abnormality Generalized atrophy. Chronic appearing changes in the white matter and pons which may be related to prior chemotherapy or chronic microvascular ischemia. Electronically Signed   By: Franchot Gallo M.D.   On: 12/20/2015 15:33 [id number]   Orlie Dakin, MD 01/13/16 [id number]

## 2016-01-13 NOTE — Progress Notes (Signed)
Patient seen and examined at bedside.  Patient is 45 year old female with known history of non-Hodgkin's lymphoma, initially diagnosed at age 41 with several recurrences, status post chemotherapy, presented to [facility] emergency department with ataxia, weakness, speech difficulties. She sees Dr. Leonie Man in outpatient setting. Mother also reported patient was having difficulty using walker over the past few weeks and she noticed upper extremity weakness as well. Please see earlier admission note by Dr. Maudie Mercury for additional details as patient was admitted after midnight. Neurology has been consulted and recommended transfer to [facility] for further evaluation.  Vital signs stable, blood work reviewed, stable. Plan to transfer to [facility].  Faye Ramsay, MD  [facility] Pager [phone]  If 7PM-7AM, please contact night-coverage [email or web address] Password [email or web address]

## 2016-01-13 NOTE — ED Notes (Signed)
Pt given meal

## 2016-01-13 NOTE — ED Notes (Signed)
Patient refusing to have MRI.  When informed it was ordered, she replied, "it's not happening."  MRI aware.

## 2016-01-13 NOTE — ED Notes (Signed)
Patient ate lunch and breakfast.  Restless and asking about an inpatient bed.

## 2016-01-13 NOTE — ED Notes (Signed)
Pt transported to CT ?

## 2016-01-13 NOTE — ED Provider Notes (Signed)
[facility] DEPT Provider Note   CSN: [id number] Arrival date & time: 01/12/16  2030     History   Chief Complaint Chief Complaint  Patient presents with  . Weakness    HPI [name] is a 45 y.o. female.  45 yo Caucasian female past medical history of her non-Hodgkin's lymphoma last chemo treatment January 2017, anxiety, depression presents to the ED this evening hours by EMS for difficulty walking, difficulty with speech, and weakness that has worsened over the last 3 weeks. The symptoms have been constant since the loss of the patient's husband in June 2017. Nothing makes it better or worse. Gradually worsening. Patient is very uncoordinated on her feet and has to use a walker. Patient has been incontinent of urine and stool. The patient is often confused. She is unable to care for herself and 2 children. She is being cared for by her mother. She lives by herself with her 2 children at home. Patient has had several episodes of vomiting yesterday. She denies any fevers, chills, headache, vision changes, chest pain, shortness of breath, abdominal pain, urinary symptoms. Mother has been caring for patient and states she needs help. Patient currently takes ativan daily.  Patient mother states patient fell yesterday and states her left hip hurts. Nothing makes better or worse. Pain intermittent. She would like for [place] to imaging to make sure there is nothing broken.  Patient is followed by Dr. Leonie Man, neurology, who diagnosed her with ataxia. He ordered an outpatient MRI scan of the brain and cervical spine for the patient is to be done yesterday. Patient was unable to complete the scan due to anxiety. She was just have follow-up with Dr. Leonie Man in 2 months..         Past Medical History:  Diagnosis Date  . Adult ADHD   . Anxiety and depression   . 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 Leonie Man.  Paraneoplastic lab panel pending.  Marland Kitchen HSV-2 (herpes simplex virus 2)  infection   . Menorrhagia    Regular: 7 days of bleeding (2 heavy and 5 light).  . 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  Recurrence x 2 after the birth of each of her children--different chemo regimen used.  Recurrence again spring 2016-in the mid/upper back, and as of 10/2015 PET scan showed no sign of dz.  Dr. Marin Olp wants her on maintenance Gavyza to prevent recurrence but as of 12/27/15 pt has not chosen to do this yet.  . Tobacco dependence in remission     Patient Active Problem List   Diagnosis Date Noted  . Weakness 01/13/2016  . 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 Adult ADHD 09/10/2013  . Tobacco dependence 09/10/2013  . Anxiety and depression 08/01/2013  . 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 BASAL CELL CARCINOMA EXCISION    . COLONOSCOPY  appox age [age] yrs   Normal  . ENDOMETRIAL ABLATION  06/2008  . HYSTEROSCOPY  08/2007   polyp  . TUBAL LIGATION      OB History    Gravida Para Term Preterm AB Living   4 2     2 2    SAB TAB Ectopic Multiple Live Births  Home Medications    Prior to Admission medications   Medication Sig Start Date End Date Taking? Authorizing Provider  ergocalciferol (VITAMIN D2) 50000 UNITS capsule Take 1 capsule (50,000 Units total) by mouth once a week. 04/19/15  Yes Volanda Napoleon, MD  LORazepam (ATIVAN) 1 MG tablet Take 1 tablet (1 mg total) by mouth every 8 (eight) hours. Patient taking differently: Take 1 mg by mouth every 8 (eight) hours as needed for anxiety.  11/24/15  Yes Volanda Napoleon, MD  potassium chloride SA (K-DUR,KLOR-CON) 20 MEQ tablet One tablet once every three days. 10/26/15  Yes Volanda Napoleon, MD    Family History Family History  Problem Relation Age of Onset  . Alcohol abuse Maternal Grandfather     . Cancer Paternal Grandmother   . Heart disease Paternal Grandfather   . Cancer Paternal Uncle     colon    Social History Social History  Substance Use Topics  . Smoking status: Former Smoker    Packs/day: 0.50    Years: 10.00    Types: Cigarettes    Quit date: 09/08/2012  . Smokeless tobacco: Never Used     Comment: Quit smoking 2 years ago  . Alcohol use 0.6 oz/week    1 Glasses of wine per week     Comment: social     Allergies   Review of patient's allergies indicates no known allergies.   Review of Systems Review of Systems  Constitutional: Positive for activity change. Negative for chills and fever.  HENT: Negative for congestion, ear pain, rhinorrhea and sore throat.   Eyes: Negative for pain and visual disturbance.  Respiratory: Negative for cough and shortness of breath.   Cardiovascular: Negative for chest pain and palpitations.  Gastrointestinal: Positive for nausea and vomiting. Negative for abdominal pain and diarrhea.  Genitourinary: Negative for dysuria, flank pain, frequency, hematuria and urgency.  Musculoskeletal: Negative for arthralgias and back pain.  Skin: Negative for color change and rash.  Neurological: Positive for dizziness and weakness. Negative for seizures, syncope, facial asymmetry and headaches.  All other systems reviewed and are negative.   Physical Exam Updated Vital Signs BP (!) 106/44 (BP Location: Left Arm)   Pulse 85   Temp 98.2 F (36.8 C) (Oral)   Resp 24   SpO2 96%   Physical Exam  Constitutional: She appears well-developed. No distress.  Thin malnourished patient sitting on stretcher, denies pain  HENT:  Head: Normocephalic and atraumatic.  Right Ear: External ear normal.  Left Ear: External ear normal.  Mouth/Throat: Oropharynx is clear and moist.  Eyes: Conjunctivae and EOM are normal. Pupils are equal, round, and reactive to light.  Neck: Normal range of motion. Neck supple. No thyromegaly present.   Cardiovascular: Normal rate, regular rhythm, normal heart sounds and intact distal pulses.   No murmur heard. Pulmonary/Chest: Effort normal and breath sounds normal. No respiratory distress.  Abdominal: Soft. Bowel sounds are normal. There is no tenderness. There is no rebound and no guarding.  Musculoskeletal: She exhibits no edema or deformity.  Patient states pain to left hip, able to ambulate without pain, complains of pain in bed, no tenders to palpation, full rom, no bruising noted.  Lymphadenopathy:    She has no cervical adenopathy.  Neurological: She is alert.  AAO x 3. Speech is slowed and slurred. Cranial nerve 2-12 are grossly intact. Strength 5/5 in all extremities. Grip strength equal. Sensation intact in all extremities. Rapid movements normal. Finger to nose with  mild ataxia bilaterally. Wide base ataxia noted. Need assistance arising from bed. Reflexes 2+ and symmetric. Toes down going.   Skin: Skin is warm and dry. Capillary refill takes less than 2 seconds.  Nursing note and vitals reviewed.    ED Treatments / Results  Labs (all labs ordered are listed, but only abnormal results are displayed) Labs Reviewed  URINALYSIS, ROUTINE W REFLEX MICROSCOPIC (NOT AT [facility]) - Abnormal; Notable for the following:       Result Value   APPearance TURBID (*)    Leukocytes, UA TRACE (*)    All other components within normal limits  COMPREHENSIVE METABOLIC PANEL - Abnormal; Notable for the following:    Glucose, Bld 110 (*)    All other components within normal limits  URINE RAPID DRUG SCREEN, HOSP PERFORMED - Abnormal; Notable for the following:    Benzodiazepines POSITIVE (*)    All other components within normal limits  URINE MICROSCOPIC-ADD ON - Abnormal; Notable for the following:    Squamous Epithelial / LPF 0-5 (*)    Bacteria, UA RARE (*)    All other components within normal limits  CBC WITH DIFFERENTIAL/PLATELET    EKG  EKG Interpretation None        Radiology Ct Head Wo Contrast  Result Date: 01/13/2016 CLINICAL DATA:  45 y/o F; increased weakness and difficulty with speech mobility over the last 3 weeks. History of non-Hodgkin's lymphoma. EXAM: CT HEAD WITHOUT CONTRAST TECHNIQUE: Contiguous axial images were obtained from the base of the skull through the vertex without intravenous contrast. COMPARISON:  MRI of the brain dated 12/20/2015. FINDINGS: Brain: No evidence of acute infarction, hemorrhage, hydrocephalus, extra-axial collection or mass lesion/mass effect. Moderate generalized atrophy for patient's age. Few lucencies in white matter and within the pons corresponding FLAIR signal abnormality on prior MRI and are nonspecific. Vascular: No hyperdense vessel or unexpected calcification. Skull: No fracture is identified.  No suspicious lesion. Sinuses/Orbits: No acute finding. Other: No unexpected finding. IMPRESSION: No acute intracranial abnormality is identified. Stable generalized volume loss and mild nonspecific white matter lucencies possibly representing microvascular disease or posttreatment changes. Electronically Signed   By: Kristine Garbe M.D.   On: 01/13/2016 00:46    Procedures Procedures (including critical care time)  Medications Ordered in ED Medications  sodium chloride 0.9 % bolus 1,000 mL (1,000 mLs Intravenous New Bag/Given 01/13/16 0014)     Initial Impression / Assessment and Plan / ED Course  I have reviewed the triage vital signs and the nursing notes.  Pertinent labs & imaging results that were available during my care of the patient were reviewed by me and considered in my medical decision making (see chart for details).   Patient presents with ataxia and difficulty speaking worsening over the past 4 weeks. Patient had decline in ADL at home. She is unable to care for her kids. She is Incontinent of stool and urine.  Labs unremarkable. UA drug positive for benzo. Xray of left hip was  unremarkable. Head CT showed mild nonspecific white matter lucencies possibly representing microvascular disease or post treatment changes. Patient given IV fluids in ED. Patient to be admitted for further workup and possible neuro consult. Dr. Winfred Leeds talked to Dr. Maudie Mercury for hospital medicine. Patient will be admitted for observation with tele. Patient and mother are ok with plan of care. They verbalized understanding. Discussed plan of care with Dr. Winfred Leeds. Final Clinical Impressions(s) / ED Diagnoses   Final diagnoses:  Ataxia  Nausea and vomiting,  vomiting of unspecified type  Weakness  Left hip pain    New Prescriptions New Prescriptions   No medications on file     Doristine Devoid, Hershal Coria 01/13/16 0216    Jola Schmidt, MD 01/13/16 [phone]

## 2016-01-14 ENCOUNTER — Inpatient Hospital Stay (HOSPITAL_COMMUNITY): Payer: BLUE CROSS/BLUE SHIELD

## 2016-01-14 ENCOUNTER — Inpatient Hospital Stay (HOSPITAL_COMMUNITY): Payer: BLUE CROSS/BLUE SHIELD | Admitting: Anesthesiology

## 2016-01-14 ENCOUNTER — Encounter (HOSPITAL_COMMUNITY)
Admission: EM | Disposition: A | Discharge status: Hospice | Payer: Self-pay | Source: Home / Self Care | Attending: Internal Medicine

## 2016-01-14 ENCOUNTER — Encounter (HOSPITAL_COMMUNITY): Payer: Self-pay | Admitting: Anesthesiology

## 2016-01-14 DIAGNOSIS — R531 Weakness: Secondary | ICD-10-CM

## 2016-01-14 DIAGNOSIS — G119 Hereditary ataxia, unspecified: Secondary | ICD-10-CM

## 2016-01-14 DIAGNOSIS — C8580 Other specified types of non-Hodgkin lymphoma, unspecified site: Secondary | ICD-10-CM

## 2016-01-14 HISTORY — PX: RADIOLOGY WITH ANESTHESIA: SHX6223

## 2016-01-14 LAB — IGG CSF INDEX
ALBUMIN CSF-MCNC: 24 mg/dL (ref 11–48)
Albumin: 4.2 g/dL (ref 3.5–5.5)
CSF IgG Index: 0.6 (ref 0.0–0.7)
IGG CSF: 1.9 mg/dL (ref 0.0–8.6)
IgG (Immunoglobin G), Serum: 592 mg/dL — ABNORMAL LOW (ref 700–1600)
IgG/Alb Ratio, CSF: 0.08 (ref 0.00–0.25)

## 2016-01-14 LAB — VITAMIN B12: Vitamin B-12: 495 pg/mL (ref 180–914)

## 2016-01-14 SURGERY — RADIOLOGY WITH ANESTHESIA
Anesthesia: General

## 2016-01-14 MED ORDER — FENTANYL CITRATE (PF) 100 MCG/2ML IJ SOLN
INTRAMUSCULAR | Status: AC
Start: 2016-01-14 — End: 2016-01-15
  Filled 2016-01-14: qty 2

## 2016-01-14 MED ORDER — THIAMINE HCL 100 MG/ML IJ SOLN
500.0000 mg | Freq: Once | INTRAVENOUS | Status: DC
  Filled 2016-01-14: qty 5

## 2016-01-14 MED ORDER — GADOBENATE DIMEGLUMINE 529 MG/ML IV SOLN
10.0000 mL | Freq: Once | INTRAVENOUS | Status: AC | PRN
  Administered 2016-01-14: 10 mL via INTRAVENOUS
  Filled 2016-01-14: qty 10

## 2016-01-14 MED ORDER — VITAMIN B-1 100 MG PO TABS: 100.0000 mg | ORAL_TABLET | Freq: Every day | ORAL | Status: DC

## 2016-01-14 MED ORDER — FENTANYL CITRATE (PF) 100 MCG/2ML IJ SOLN
INTRAMUSCULAR | Status: AC
  Filled 2016-01-14: qty 2

## 2016-01-14 MED ORDER — MIDAZOLAM HCL 2 MG/2ML IJ SOLN
INTRAMUSCULAR | Status: AC
  Filled 2016-01-14: qty 2

## 2016-01-14 MED ORDER — HYDROMORPHONE HCL 1 MG/ML IJ SOLN
0.5000 mg | Freq: Once | INTRAMUSCULAR | Status: AC
Start: 2016-01-14 — End: 2016-01-14
  Administered 2016-01-14: 0.5 mg via INTRAVENOUS
  Filled 2016-01-14: qty 1

## 2016-01-14 MED ORDER — PROPOFOL 10 MG/ML IV BOLUS
INTRAVENOUS | Status: AC
Start: 2016-01-14 — End: 2016-01-14
  Filled 2016-01-14: qty 40

## 2016-01-14 MED ORDER — HYDROMORPHONE HCL 1 MG/ML IJ SOLN
0.5000 mg | Freq: Once | INTRAMUSCULAR | Status: AC
  Administered 2016-01-14: 0.5 mg via INTRAVENOUS
  Filled 2016-01-14: qty 1

## 2016-01-14 MED ORDER — LACTATED RINGERS IV SOLN
INTRAVENOUS | Status: DC
  Administered 2016-01-14: 16:00:00 via INTRAVENOUS

## 2016-01-14 MED ORDER — FENTANYL CITRATE (PF) 100 MCG/2ML IJ SOLN
25.0000 ug | Freq: Once | INTRAMUSCULAR | Status: AC
  Administered 2016-01-14: 25 ug via INTRAVENOUS

## 2016-01-14 NOTE — Anesthesia Preprocedure Evaluation (Addendum)
Anesthesia Evaluation  Patient identified by MRN, date of birth, ID band Patient awake    Reviewed: Allergy & Precautions, NPO status , Patient's Chart, lab work & pertinent test results  Airway Mallampati: II  TM Distance: >3 FB Neck ROM: Full    Dental  (+) Teeth Intact, Dental Advisory Given   Pulmonary former smoker,    breath sounds clear to auscultation       Cardiovascular negative cardio ROS   Rhythm:Regular Rate:Normal     Neuro/Psych PSYCHIATRIC DISORDERS Depression negative neurological ROS     GI/Hepatic negative GI ROS, Neg liver ROS,   Endo/Other  negative endocrine ROS  Renal/GU negative Renal ROS  negative genitourinary   Musculoskeletal negative musculoskeletal ROS (+)   Abdominal Normal abdominal exam  (+)   Peds negative pediatric ROS (+)  Hematology negative hematology ROS (+)   Anesthesia Other Findings   Reproductive/Obstetrics negative OB ROS                            Anesthesia Physical Anesthesia Plan  ASA: II  Anesthesia Plan: General   Post-op Pain Management:    Induction: Intravenous  Airway Management Planned: Oral ETT  Additional Equipment:   Intra-op Plan:   Post-operative Plan: Extubation in OR  Informed Consent: I have reviewed the patients History and Physical, chart, labs and discussed the procedure including the risks, benefits and alternatives for the proposed anesthesia with the patient or authorized representative who has indicated his/her understanding and acceptance.   Dental advisory given  Plan Discussed with: CRNA  Anesthesia Plan Comments:         Anesthesia Quick Evaluation

## 2016-01-14 NOTE — Consult Note (Signed)
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 Weakness  : I'm having a hard time walking.  HPI: [name] is very well-known to me. She is a very nice 45 year old white female. She, unfortunately, has been under an incredible amount of stress. Her husband, who had metastatic esophageal cancer passed away a few months ago. She really did a great job in trying to help him.  She had a history of recurrent follicular cutaneous lymphoma. We treated her with Gazyva/bendamustine. She had 4 cycles of chemotherapy. Her chemotherapy finished back in October 2016. She got 2 additional cycles of Gazyva. This was last given back in January.  Showed a PET scan done in June which did not show any evidence of lymphoma.  I saw her about a month ago and she looked pretty good. She is having no complaints outside of some weight loss. Depression and began to have problems a few weeks ago. She had more problems walking. She has some difficulties with speech. She had some falling issues.  She got to the point where she had a user will chair.  She has seen neurology. The neurologist feels this is some form of cerebellar ataxia. She did have a spinal tap done yesterday. Her glucose was 64. Protein was 39. She had 4 white cells. Cytology is pending.  She had a brain scan. She's not had a MRI of the brain as she is ago under general anesthesia for this.  The CT of the brain showed no acute intracranial abnormalities. There is some mild nonspecific white matter lucencies.  She's had no fever. She's had no swollen lymph nodes. She's had no bowel or bladder incontinence. She's had no nausea or vomiting. Her appetite juices not that great.    Past Medical History:  Diagnosis Date  . Adult ADHD   . Anxiety and depression   . 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  Paraneoplastic  lab panel pending.  Marland Kitchen HSV-2 (herpes simplex virus 2) infection   . Menorrhagia    Regular: 7 days of bleeding (2 heavy and 5 light).  . 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  Initial dx age 6--treated with chemo.  Recurrence x 2 after the birth of each of her children--different chemo regimen used.  Recurrence again spring 2016-in the mid/upper back, and as of 10/2015 PET scan showed no sign of dz.  [name] wants her on maintenance Gavyza to prevent recurrence but as of 12/27/15 pt has not chosen to do this yet.  . Tobacco dependence in remission   :  Past Surgical History:  Procedure Laterality Date  . BASAL CELL CARCINOMA EXCISION    . COLONOSCOPY  appox age [age] yrs   Normal  . ENDOMETRIAL ABLATION  06/2008  . HYSTEROSCOPY  08/2007   polyp  . TUBAL LIGATION    :   Current Facility-Administered Medications:  .  acetaminophen (TYLENOL) tablet 650 mg, 650 mg, Oral, Q6H PRN, 650 mg at 01/13/16 2303 **OR** acetaminophen (TYLENOL) suppository 650 mg, 650 mg, Rectal, Q6H PRN, Erin Gravel, [name] .  diphenhydrAMINE (BENADRYL) capsule 25 mg, 25 mg, Oral, Q6H PRN, Erin Meadow, [name], 25 mg at 01/13/16 2204 .  LORazepam (ATIVAN) tablet 1 mg, 1 mg, Oral, Q8H PRN, Erin Gravel, [name], 1 mg at 01/13/16 1859 .  potassium chloride SA (K-DUR,KLOR-CON) CR  tablet 20 mEq, 20 mEq, Oral, Daily, Erin Blaze, [name], Stopped at 01/13/16 0930 .  sodium chloride flush (NS) 0.9 % injection 3 mL, 3 mL, Intravenous, Q12H, Erin Gravel, [name], 3 mL at 01/13/16 2205 .  Vitamin D (Ergocalciferol) (DRISDOL) capsule 50,000 Units, 50,000 Units, Oral, Weekly, Erin Gravel, [name]:  . potassium chloride SA  20 mEq Oral Daily  . sodium chloride flush  3 mL Intravenous Q12H  . Vitamin D (Ergocalciferol)  50,000 Units Oral Weekly  :  No Known Allergies:  Family History  Problem Relation Age of Onset  . Hyperlipidemia Mother   . CAD Father   . Alcohol abuse Maternal Grandfather   . Cancer Paternal  Grandmother   . Heart disease Paternal Grandfather   . Cancer Paternal Uncle     colon  :  Social History   Social History  . Marital status: Married    Spouse name: N/A  . Number of children: N/A  . Years of education: N/A   Occupational History  . Not on file.   Social History Main Topics  . Smoking status: Former Smoker    Packs/day: 0.50    Years: 10.00    Types: Cigarettes    Quit date: 09/08/2012  . Smokeless tobacco: Never Used     Comment: Quit smoking 2 years ago  . Alcohol use No  . Drug use: No  . Sexual activity: Yes    Birth control/ protection: Surgical   Other Topics Concern  . Not on file   Social History Narrative   Married, 2 children.   Orig from [place] area of [place].   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.   Tob 15 pack-yr hx.   Alcohol: a few drinks on weekend.   Drugs: none  :  Pertinent items are noted in HPI.  Exam: Patient Vitals for the past 24 hrs:  BP Temp Temp src Pulse Resp SpO2  01/14/16 0546 (!) 99/44 98.5 F (36.9 C) Oral 72 18 100 %  01/14/16 0155 (!) 93/42 98.8 F (37.1 C) Oral 82 18 100 %  01/13/16 2058 (!) 105/57 98.8 F (37.1 C) Oral 96 20 99 %  01/13/16 1805 108/62 - - - - -  01/13/16 1804 (!) 110/36 98.4 F (36.9 C) Oral 94 20 100 %  01/13/16 1704 105/90 - - 102 23 100 %  01/13/16 1700 97/60 - - 76 20 100 %  01/13/16 1500 (!) 93/52 - - 90 23 100 %  01/13/16 1350 118/70 - - 104 16 100 %  01/13/16 1200 109/71 - - 111 25 98 %  01/13/16 1135 107/58 - - 90 21 100 %  01/13/16 1100 113/67 - - 94 23 100 %  01/13/16 1004 103/63 - - 81 24 100 %  01/13/16 0800 114/60 - - 73 20 98 %    Petite white female in no obvious distress. Head neck exam shows no ocular or oral lesions. There is no nystagmus. There is no adenopathy in the neck. Lungs are clear. Cardiac exam regular rate and rhythm with no murmurs, rubs or bruits. Abdomen is soft. She has good bowel sounds. There is no fluid wave. There is no palpable liver  or spleen tip. Skin exam shows no rashes, ecchymoses or petechia. Neurological exam shows some dysarthria. She has some generalized weakness.    Recent Labs  01/13/16 0015 01/13/16 0503  WBC 5.6 5.6  HGB 12.7 12.1  HCT 38.5 35.5*  PLT 285  261    Recent Labs  01/13/16 0015 01/13/16 0503  NA 136 139  K 3.7 4.0  CL 103 106  CO2 25 28  GLUCOSE 110* 105*  BUN 10 8  CREATININE 0.56 0.53  CALCIUM 9.3 9.4    Blood smear review:  None   Pathology: None    Assessment and Plan:  Ms. Matott is a very nice 45 year old white female. She had a history of recurrent follicular cutaneous lymphoma. This was a low-grade type of lymphoma. She only had 2 spots that were active. She was treated with systemic therapy. She got into remission.  I'm just absolutely despondent that she is having this problem now. It is hard to say that this is related to the lymphoma or to her past therapies. I have had our [facility] pharmacist try to get some information formate. She cannot find anything about having a delayed response or delayed reaction to treatment. She's had no Treanda for about 10 months. She's not had Gazyva for 8 months. I would think that would be unlikely that she develop some PML this far after the fact. Of course, I'm sure you noise find case reports. I guess the MRI will be helpful.  Paraneoplastic cerebellar ataxia is always possible. Again as far as I can tell, she does not have any active lymphoma. She was just tested with a PET scan 2 months ago and this came out fine. However, I'm sure that there are case reports.  It'll be interesting to see what the spinal fluid cytology shows. I suppose that she could have CNS involvement by lymphoma although this is a low-grade type of lymphoma which would be highly unusual to recur in the CSF. However, one cannot fully discount this possibility.  She has just been through so much this past year. This really is something that she should not have  to deal with. I do not know if all the stress that she's been under is contributing to this.  I know that she is going to get great care upon 64M. I appreciate everybody's help. Again, she just has had a very tough year and this certainly is not adding to it.  Erin Haw, [name]  [name] 1:5-6

## 2016-01-14 NOTE — Progress Notes (Signed)
Patient Demographics:    [name], is a 45 y.o. female, DOB - March 09, 1971, [id number]  Admit date - 01/12/2016   Admitting Physician Theodis Blaze, MD  Outpatient Primary MD for the patient is Tammi Sou, MD  LOS - 1   Chief Complaint  Patient presents with  . 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 MRI of the brain with and without contrast under  To be done on 01/14/16. Discussed with Dr. Kathrynn Speed, ?? Need for high-dose steroids, CSF studies pending  2)H/o recurrent follicular continuous lymphoma (non-Hodgkin's)- last chemotherapy completed  05/2016, patient sees Dr. Burney Gauze.    Code Status : Full   Disposition Plan  : pending neuro workup  Consults  :  Neurology and oncology   DVT Prophylaxis  :SCDs   Lab Results  Component Value Date   PLT 261 01/13/2016    Inpatient Medications  Scheduled Meds: . fentaNYL      . [MAR Hold] potassium chloride SA  20 mEq Oral Daily  . [MAR Hold] sodium chloride flush  3 mL Intravenous Q12H  . [MAR Hold] Vitamin D (Ergocalciferol)  50,000 Units Oral Weekly   Continuous Infusions: . lactated ringers 10 mL/hr at 01/14/16 1538   PRN Meds:.[MAR Hold] acetaminophen **OR** [MAR Hold] acetaminophen, [MAR Hold] diphenhydrAMINE, gadobenate dimeglumine, [MAR Hold] LORazepam    Anti-infectives    None        Objective:   Vitals:   01/14/16 0155 01/14/16 0546 01/14/16 0852 01/14/16 1403  BP: (!) 93/42 (!) 99/44 (!) 117/43 (!) 106/48  Pulse: 82 72 100 (!) 104  Resp: 18 18 18 18   Temp: 98.8 F (37.1 C) 98.5 F (36.9 C) 98.2 F (36.8  C) 98.9 F (37.2 C)  TempSrc: Oral Oral Oral Oral  SpO2: 100% 100% 100% 100%    Wt Readings from Last 3 Encounters:  12/30/15 48.9 kg (107 lb 12.8 oz)  12/27/15 48.8 kg (107 lb 8 oz)  12/23/15 49 kg (108 lb)     Intake/Output Summary (Last 24 hours) at 01/14/16 1740 Last data filed at 01/13/16 2200  Gross per 24 hour  Intake              100 ml  Output                0 ml  Net              100 ml     Physical Exam  Gen:- Awake Alert,   HEENT:- [place].AT, No sclera icterus Neck-Supple Neck,No JVD,.  Lungs-  CTAB  CV- S1, S2 normal Abd-  +ve B.Sounds, Abd Soft, No tenderness,    Extremity/Skin:- No  edema,    NeuroPsych- gait and balance problems and dysarthria persist, Patient also has some memory and cognitive deficits    Data Review:   Micro Results No results found for this or any previous visit (from the past  240 hour(s)).  Radiology Reports Ct Head Wo Contrast  Result Date: 01/13/2016 CLINICAL DATA:  45 y/o F; increased weakness and difficulty with speech mobility over the last 3 weeks. History of non-Hodgkin's lymphoma. EXAM: CT HEAD WITHOUT CONTRAST TECHNIQUE: Contiguous axial images were obtained from the base of the skull through the vertex without intravenous contrast. COMPARISON:  MRI of the brain dated 12/20/2015. FINDINGS: Brain: No evidence of acute infarction, hemorrhage, hydrocephalus, extra-axial collection or mass lesion/mass effect. Moderate generalized atrophy for patient's age. Few lucencies in white matter and within the pons corresponding FLAIR signal abnormality on prior MRI and are nonspecific. Vascular: No hyperdense vessel or unexpected calcification. Skull: No fracture is identified.  No suspicious lesion. Sinuses/Orbits: No acute finding. Other: No unexpected finding. IMPRESSION: No acute intracranial abnormality is identified. Stable generalized volume loss and mild nonspecific white matter lucencies possibly representing microvascular disease or  posttreatment changes. Electronically Signed   By: Kristine Garbe M.D.   On: 01/13/2016 00:46   Mr Jeri Cos [id number] Contrast  Result Date: 12/20/2015 CLINICAL DATA:  Blurred vision with difficulty walking. History of non-Hodgkin's lymphoma and chemotherapy. EXAM: MRI HEAD WITHOUT AND WITH CONTRAST TECHNIQUE: Multiplanar, multiecho pulse sequences of the brain and surrounding structures were obtained without and with intravenous contrast. CONTRAST:  58mL MULTIHANCE GADOBENATE DIMEGLUMINE 529 MG/ML IV SOLN COMPARISON:  None. FINDINGS: Mild atrophy.  Mild ventricular enlargement consistent with atrophy. Negative for acute infarct. Scattered small hyperintensities in the deep white matter bilaterally and in the pons. This may be related to prior chemotherapy or chronic ischemia. Negative for intracranial hemorrhage.  No fluid collection Negative for mass or edema Postcontrast imaging demonstrates normal enhancement. No enhancing mass. Normal venous enhancement. Mild mucosal edema paranasal sinuses. Normal orbit. Pituitary not enlarged. IMPRESSION: No acute intracranial abnormality Generalized atrophy. Chronic appearing changes in the white matter and pons which may be related to prior chemotherapy or chronic microvascular ischemia. Electronically Signed   By: Franchot Gallo M.D.   On: 12/20/2015 15:33  Dg Hip Unilat With Pelvis 2-3 Views Left  Result Date: 01/13/2016 CLINICAL DATA:  45 y/o  F; status post fall with anterior hip pain. EXAM: DG HIP (WITH OR WITHOUT PELVIS) 2-3V LEFT COMPARISON:  None. FINDINGS: There is no evidence of hip fracture or dislocation. There is no evidence of arthropathy or other focal bone abnormality. Clips project over the pelvis. IMPRESSION: No acute fracture or dislocation is identified. Electronically Signed   By: Kristine Garbe M.D.   On: 01/13/2016 02:07     CBC  Recent Labs Lab 01/13/16 0015 01/13/16 0503  WBC 5.6 5.6  HGB 12.7 12.1  HCT 38.5 35.5*    PLT 285 261  MCV 90.2 89.4  MCH 29.7 30.5  MCHC 33.0 34.1  RDW 13.4 13.0  LYMPHSABS 0.8  --   MONOABS 0.6  --   EOSABS 0.2  --   BASOSABS 0.0  --     Chemistries   Recent Labs Lab 01/13/16 0015 01/13/16 0503  NA 136 139  K 3.7 4.0  CL 103 106  CO2 25 28  GLUCOSE 110* 105*  BUN 10 8  CREATININE 0.56 0.53  CALCIUM 9.3 9.4  AST 21 23  ALT 17 16  ALKPHOS 88 82  BILITOT 0.5 0.7   ------------------------------------------------------------------------------------------------------------------ No results for input(s): CHOL, HDL, LDLCALC, TRIG, CHOLHDL, LDLDIRECT in the last 72 hours.  No results found for: HGBA1C ------------------------------------------------------------------------------------------------------------------ No results for input(s): TSH, T4TOTAL, T3FREE, THYROIDAB in the last 72  hours.  Invalid input(s): FREET3 ------------------------------------------------------------------------------------------------------------------  Recent Labs  01/14/16 1021  VITAMINB12 495    Coagulation profile No results for input(s): INR, PROTIME in the last 168 hours.  No results for input(s): DDIMER in the last 72 hours.  Cardiac Enzymes No results for input(s): CKMB, TROPONINI, MYOGLOBIN in the last 168 hours.  Invalid input(s): CK ------------------------------------------------------------------------------------------------------------------ No results found for: BNP   Maximiliano Cromartie M.D on 01/14/2016 at 5:40 PM  Between 7am to 7pm - Pager - [phone]  After 7pm go to [email or web address] - password [email or web address]  [facility] -  Office  [phone]  Dragon dictation system was used to create this note, attempts have been made to correct errors, however presence of uncorrected errors is not a reflection quality of care provided

## 2016-01-14 NOTE — Progress Notes (Signed)
Subjective: No significant changes  Exam: Vitals:   01/14/16 0852 01/14/16 1403  BP: (!) 117/43 (!) 106/48  Pulse: 100 (!) 104  Resp: 18 18  Temp: 98.2 F (36.8 C) 98.9 F (37.2 C)   Gen: In bed, NAD Resp: non-labored breathing, no acute distress Abd: soft, nt  Neuro: MS: Awake, aler, t [id number], saccadic smooth pursuit Motor: 5/5 throughout Cerebellar: ataxia bilaterally  Pertinent Labs: B12 495 Cytology - negative.   Impression: 45 yo F with subacute progressive cerebellar dysfunction. With the duration of time since her last chemo, PML and med reaction are significant less likely. If MRI is negative or   Recommendations: 1) await B1, Vit E, paraneoplastic panel 2) If MRI does nto give clear reason to not do so, then would start IV steroids tomorrow 3) Will start IV thiamine while awaiting B1, though my suspicion is low for this.  4) Will follow.     Roland Rack, MD [facility] [phone]  If 7pm- 7am, please page neurology on call as listed in [place].

## 2016-01-15 ENCOUNTER — Encounter (HOSPITAL_COMMUNITY): Payer: Self-pay | Admitting: Radiology

## 2016-01-15 ENCOUNTER — Inpatient Hospital Stay (HOSPITAL_COMMUNITY): Payer: BLUE CROSS/BLUE SHIELD

## 2016-01-15 MED ORDER — IOPAMIDOL (ISOVUE-300) INJECTION 61%
INTRAVENOUS | Status: AC
  Filled 2016-01-15: qty 100

## 2016-01-15 MED ORDER — VITAMIN B-1 100 MG PO TABS
100.0000 mg | ORAL_TABLET | Freq: Every day | ORAL | Status: DC
  Administered 2016-01-16 – 2016-01-26 (×11): 100 mg via ORAL
  Filled 2016-01-15 (×11): qty 1

## 2016-01-15 MED ORDER — THIAMINE HCL 100 MG/ML IJ SOLN
500.0000 mg | Freq: Once | INTRAVENOUS | Status: AC
  Administered 2016-01-15: 500 mg via INTRAVENOUS
  Filled 2016-01-15: qty 5

## 2016-01-15 MED ORDER — BARIUM SULFATE 2.1 % PO SUSP
ORAL | Status: AC
  Filled 2016-01-15: qty 2

## 2016-01-15 MED ORDER — IOPAMIDOL (ISOVUE-300) INJECTION 61%
INTRAVENOUS | Status: AC
  Administered 2016-01-15: 100 mL
  Filled 2016-01-15: qty 100

## 2016-01-15 MED ORDER — POLYETHYLENE GLYCOL 3350 17 G PO PACK
17.0000 g | PACK | Freq: Every day | ORAL | Status: DC
  Administered 2016-01-15 – 2016-01-26 (×12): 17 g via ORAL
  Filled 2016-01-15 (×12): qty 1

## 2016-01-15 MED ORDER — SODIUM CHLORIDE 0.9 % IV SOLN
1000.0000 mg | Freq: Every day | INTRAVENOUS | Status: AC
  Administered 2016-01-15 – 2016-01-19 (×5): 1000 mg via INTRAVENOUS
  Filled 2016-01-15 (×5): qty 8

## 2016-01-15 MED ORDER — HEPARIN SODIUM (PORCINE) 5000 UNIT/ML IJ SOLN
5000.0000 [IU] | Freq: Three times a day (TID) | INTRAMUSCULAR | Status: DC
  Administered 2016-01-15 – 2016-01-17 (×4): 5000 [IU] via SUBCUTANEOUS
  Filled 2016-01-15 (×4): qty 1

## 2016-01-15 MED ORDER — LORAZEPAM 1 MG PO TABS
2.0000 mg | ORAL_TABLET | Freq: Four times a day (QID) | ORAL | Status: DC | PRN
  Administered 2016-01-16 – 2016-01-26 (×16): 2 mg via ORAL
  Filled 2016-01-15 (×18): qty 2

## 2016-01-15 MED ORDER — HYDROCODONE-ACETAMINOPHEN 5-325 MG PO TABS
2.0000 | ORAL_TABLET | Freq: Once | ORAL | Status: AC
  Administered 2016-01-15: 2 via ORAL
  Filled 2016-01-15: qty 2

## 2016-01-15 MED ORDER — IMMUNE GLOBULIN (HUMAN) 20 GM/200ML IV SOLN
400.0000 mg/kg | INTRAVENOUS | Status: AC
  Administered 2016-01-15 – 2016-01-19 (×5): 20 g via INTRAVENOUS
  Filled 2016-01-15 (×6): qty 200

## 2016-01-15 NOTE — Progress Notes (Signed)
Patient Demographics:    [name], is a 45 y.o. female, DOB - Jan 18, 1971, [id number]  Admit date - 01/12/2016   Admitting Physician Theodis Blaze, MD  Outpatient Primary MD for the patient is Tammi Sou, MD  LOS - 2   Chief Complaint  Patient presents with  . 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 Paraneoplastic panel has been ordered by Dr. Kathrynn Speed.  . Discussed with Dr. Kathrynn Speed,  high-dose steroids (solu-medrol 1 gm daily) and IVIG for the next 5 days (started 01/15/16), CSF studies pending  2)H/o recurrent follicular continuous lymphoma (non-Hodgkin's)- last chemotherapy completed  05/2016, patient sees Dr. Burney Gauze.   Code Status : Full  Disposition Plan  : After completing high-dose steroids and IVIG for the next 5 days  Consults  :  Oncology/neurology   DVT Prophylaxis  :  Heparin   Lab Results  Component Value Date   PLT 261 01/13/2016    Inpatient Medications  Scheduled Meds: . Barium Sulfate      . Immune Globulin 10%  400 mg/kg Intravenous Q24 Hr x 5  . methylPREDNISolone (SOLU-MEDROL) injection  1,000 mg Intravenous Daily  . potassium chloride SA  20 mEq Oral Daily  . sodium chloride flush  3 mL Intravenous Q12H  . [START ON 01/16/2016] thiamine  100 mg Oral Daily  . Vitamin D (Ergocalciferol)  50,000 Units Oral Weekly   Continuous Infusions: . lactated ringers 10 mL/hr at 01/14/16 1538   PRN Meds:.acetaminophen  **OR** acetaminophen, LORazepam    Anti-infectives    None        Objective:   Vitals:   01/15/16 0145 01/15/16 0500 01/15/16 1024 01/15/16 1700  BP: (!) 100/47 (!) 100/49 (!) 113/43 (!) 96/47  Pulse: 73 87 (!) 104 88  Resp: 16 16 19 19   Temp: 98 F (36.7 C) 98.3 F (36.8 C) 98.2 F (36.8 C) 98 F (36.7 C)  TempSrc: Oral Oral Oral Oral  SpO2: 98% 100% 98% 99%  Weight:  47.7 kg (105 lb 2.6 oz)      Wt Readings from Last 3 Encounters:  01/15/16 47.7 kg (105 lb 2.6 oz)  12/30/15 48.9 kg (107 lb 12.8 oz)  12/27/15 48.8 kg (107 lb 8 oz)     Intake/Output Summary (Last 24 hours) at 01/15/16 1852 Last data filed at 01/15/16 1200  Gross per 24 hour  Intake              960 ml  Output              150 ml  Net              810 ml     Physical Exam  Gen:- Awake Alert,  Patient is intermittently confused and  forgetful HEENT:- Crown.AT, No sclera icterus Neck-Supple Neck,No JVD,.  Lungs-  CTAB  CV- S1, S2 normal Abd-  +ve B.Sounds, Abd Soft, No tenderness,    Extremity/Skin:- No  edema,    NeuroPsych- gait and balance issues persist, patient continues to have memory and cognitive deficits,     Data Review:   Micro Results No results found for this or any previous visit (from the past 240 hour(s)).  Radiology Reports Ct Head Wo Contrast  Result Date: 01/13/2016 CLINICAL DATA:  45 y/o F; increased weakness and difficulty with speech mobility over the last 3 weeks. History of non-Hodgkin's lymphoma. EXAM: CT HEAD WITHOUT CONTRAST TECHNIQUE: Contiguous axial images were obtained from the base of the skull through the vertex without intravenous contrast. COMPARISON:  MRI of the brain dated 12/20/2015. FINDINGS: Brain: No evidence of acute infarction, hemorrhage, hydrocephalus, extra-axial collection or mass lesion/mass effect. Moderate generalized atrophy for patient's age. Few lucencies in white matter and within the pons corresponding FLAIR signal abnormality on prior MRI  and are nonspecific. Vascular: No hyperdense vessel or unexpected calcification. Skull: No fracture is identified.  No suspicious lesion. Sinuses/Orbits: No acute finding. Other: No unexpected finding. IMPRESSION: No acute intracranial abnormality is identified. Stable generalized volume loss and mild nonspecific white matter lucencies possibly representing microvascular disease or posttreatment changes. Electronically Signed   By: Kristine Garbe M.D.   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 Staging. Subsequent encounter. EXAM: CT NECK WITH CONTRAST TECHNIQUE: Multidetector CT imaging of the neck was performed using the standard protocol following the bolus administration of intravenous contrast. CONTRAST:  120mL ISOVUE-300 IOPAMIDOL (ISOVUE-300) INJECTION 61% in conjunction with contrast enhanced imaging of the chest, abdomen, and pelvis reported separately. COMPARISON:  Brain MRI 01/14/2016. Head CT without contrast 01/13/2016. PET-CT 11/03/2015. FINDINGS: Pharynx and larynx: Larynx is normal in best seen on series 6, image 70. Pharyngeal soft tissue contours are within normal limits. Negative parapharyngeal and retropharyngeal spaces. Salivary glands: Negative sublingual space, sublingual glands, submandibular glands, and parotid glands. Thyroid: Negative. Lymph nodes: No cervical lymphadenopathy. Only diminutive (4 mm short axis or smaller) bilateral lymph nodes are identified. Vascular: Major vascular structures in the neck and at skullbase are patent and appear normal. Limited intracranial: Stable from yesterday brain MRI. Visualized orbits: Negative. Mastoids and visualized paranasal sinuses: Visualized paranasal sinuses and mastoids are stable and well pneumatized. Skeleton: No osseous abnormality identified. Upper chest: Chest CT is reported separately today. IMPRESSION: 1.  Negative neck CT.  No evidence of lymphoma in the neck. 2. CT chest abdomen and pelvis today reported separately. Electronically Signed   By: Genevie Ann M.D.   On: 01/15/2016 18:23   Ct Chest W Contrast  Result Date: 01/15/2016 CLINICAL DATA:  Recurrent lymphoma. EXAM: CT CHEST, ABDOMEN, AND PELVIS WITH CONTRAST TECHNIQUE: Multidetector CT imaging of the chest, abdomen and pelvis was performed following the standard protocol during bolus administration of intravenous contrast. CONTRAST:  155mL ISOVUE-300 IOPAMIDOL (ISOVUE-300) INJECTION 61% COMPARISON:  None. FINDINGS: CT CHEST FINDINGS Cardiovascular: The heart and great vessels are normal. Mediastinum/Nodes: Normal.  No adenopathy. Lungs/Pleura: Minimal linear opacity anteriorly in the right lung on series 3, image 61 is stable and of doubtful significance. This may represent a tiny region of scar. The lungs are otherwise normal. Musculoskeletal: Normal. CT ABDOMEN PELVIS FINDINGS Hepatobiliary: Normal. Pancreas: Normal. Spleen: Normal. Adrenals/Urinary Tract: Normal. Stomach/Bowel: The stomach and  small bowel are normal. Moderate fecal loading is seen throughout the colon. The colon is otherwise normal. The cecum is seen in the right side of the pelvis. Visualized portions of the appendix are normal. Vascular/Lymphatic: No aneurysm or dissection.  No adenopathy. Reproductive: The patient is status post tubal ligation. The uterus and adnexae are normal. Other: No other abnormalities. Musculoskeletal: No acute bony abnormalities. IMPRESSION: 1. No evidence of lymphoma recurrence in the chest, abdomen, or pelvis. 2. Moderate fecal loading throughout the colon. 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 Recent PET scan was negative for disease. Recent onset of difficulty walking and speech issues.  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 EXAM: MRI HEAD WITHOUT AND WITH CONTRAST TECHNIQUE: Multiplanar, multiecho pulse sequences of the brain and surrounding structures were obtained without and with intravenous contrast. CONTRAST:  39mL MULTIHANCE GADOBENATE DIMEGLUMINE 529 MG/ML IV SOLN COMPARISON:  12/20/2015. FINDINGS: No acute stroke or hemorrhage.  No mass lesion or extra-axial fluid. Global atrophy, hydrocephalus ex vacuo. Significant premature brain substance loss affecting the cerebral hemispheres and cerebellum. Premature for age subcortical greater than periventricular white matter signal abnormality, also involving the pons. No definite involvement of the cerebellar white matter. There is moderate interval progression of the T2 and FLAIR hyperintensity in the pons, and to a lesser degree, progression in the subcortical white matter, compared with the study 1 month earlier. These show no restriction, susceptibility, or postcontrast enhancement. Partial empty sella. Flow voids are maintained. Extracranial soft tissues are unremarkable. Post infusion imaging through the entire head demonstrates no abnormal enhancement of brain or meninges. IMPRESSION: Interval progression of white matter signal abnormality, greatest in the pons, compared to the study 1 month earlier. These white matter lesions show no restriction, susceptibility, or postcontrast enhancement. 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 Global atrophy affecting the cerebral hemispheres, brainstem, and cerebellum, stable from priors. No areas of restricted diffusion or postcontrast enhancement. Electronically Signed   By: Staci Righter M.D.   On: 01/14/2016 18:37   Mr Jeri Cos [id number] Contrast  Result Date: 12/20/2015 CLINICAL DATA:  Blurred vision with difficulty walking. History of non-Hodgkin's lymphoma and chemotherapy. EXAM: MRI HEAD WITHOUT AND WITH CONTRAST  TECHNIQUE: Multiplanar, multiecho pulse sequences of the brain and surrounding structures were obtained without and with intravenous contrast. CONTRAST:  73mL MULTIHANCE GADOBENATE DIMEGLUMINE 529 MG/ML IV SOLN COMPARISON:  None. FINDINGS: Mild atrophy.  Mild ventricular enlargement consistent with atrophy. Negative for acute infarct. Scattered small hyperintensities in the deep white matter bilaterally and in the pons. This may be related to prior chemotherapy or chronic ischemia. Negative for intracranial hemorrhage.  No fluid collection Negative for mass or edema Postcontrast imaging demonstrates normal enhancement. No enhancing mass. Normal venous enhancement. Mild mucosal edema paranasal sinuses. Normal orbit. Pituitary not enlarged. IMPRESSION: No acute intracranial abnormality Generalized atrophy. Chronic appearing changes in the white matter and pons which may be related to prior chemotherapy or chronic microvascular ischemia. Electronically Signed   By: Franchot Gallo M.D.   On: 12/20/2015 15:33  Ct Abdomen Pelvis W Contrast  Result Date: 01/15/2016 CLINICAL DATA:  Recurrent lymphoma. EXAM: CT CHEST, ABDOMEN, AND PELVIS WITH CONTRAST TECHNIQUE: Multidetector CT imaging of the chest, abdomen and pelvis was performed following the standard protocol during bolus administration of intravenous contrast. CONTRAST:  175mL  ISOVUE-300 IOPAMIDOL (ISOVUE-300) INJECTION 61% COMPARISON:  None. FINDINGS: CT CHEST FINDINGS Cardiovascular: The heart and great vessels are normal. Mediastinum/Nodes: Normal.  No adenopathy. Lungs/Pleura: Minimal linear opacity anteriorly in the right lung on series 3, image 61 is stable and of doubtful significance. This may represent a tiny region of scar. The lungs are otherwise normal. Musculoskeletal: Normal. CT ABDOMEN PELVIS FINDINGS Hepatobiliary: Normal. Pancreas: Normal. Spleen: Normal. Adrenals/Urinary Tract: Normal. Stomach/Bowel: The stomach and small bowel are normal.  Moderate fecal loading is seen throughout the colon. The colon is otherwise normal. The cecum is seen in the right side of the pelvis. Visualized portions of the appendix are normal. Vascular/Lymphatic: No aneurysm or dissection.  No adenopathy. Reproductive: The patient is status post tubal ligation. The uterus and adnexae are normal. Other: No other abnormalities. Musculoskeletal: No acute bony abnormalities. IMPRESSION: 1. No evidence of lymphoma recurrence in the chest, abdomen, or pelvis. 2. Moderate fecal loading throughout the colon. Electronically Signed   By: Dorise Bullion III M.D   On: 01/15/2016 18:30   Dg Hip Unilat With Pelvis 2-3 Views Left  Result Date: 01/13/2016 CLINICAL DATA:  45 y/o  F; status post fall with anterior hip pain. EXAM: DG HIP (WITH OR WITHOUT PELVIS) 2-3V LEFT COMPARISON:  None. FINDINGS: There is no evidence of hip fracture or dislocation. There is no evidence of arthropathy or other focal bone abnormality. Clips project over the pelvis. IMPRESSION: No acute fracture or dislocation is identified. Electronically Signed   By: Kristine Garbe M.D.   On: 01/13/2016 02:07     CBC  Recent Labs Lab 01/13/16 0015 01/13/16 0503  WBC 5.6 5.6  HGB 12.7 12.1  HCT 38.5 35.5*  PLT 285 261  MCV 90.2 89.4  MCH 29.7 30.5  MCHC 33.0 34.1  RDW 13.4 13.0  LYMPHSABS 0.8  --   MONOABS 0.6  --   EOSABS 0.2  --   BASOSABS 0.0  --     Chemistries   Recent Labs Lab 01/13/16 0015 01/13/16 0503  NA 136 139  K 3.7 4.0  CL 103 106  CO2 25 28  GLUCOSE 110* 105*  BUN 10 8  CREATININE 0.56 0.53  CALCIUM 9.3 9.4  AST 21 23  ALT 17 16  ALKPHOS 88 82  BILITOT 0.5 0.7   ------------------------------------------------------------------------------------------------------------------ No results for input(s): CHOL, HDL, LDLCALC, TRIG, CHOLHDL, LDLDIRECT in the last 72 hours.  No results found for:  HGBA1C ------------------------------------------------------------------------------------------------------------------ No results for input(s): TSH, T4TOTAL, T3FREE, THYROIDAB in the last 72 hours.  Invalid input(s): FREET3 ------------------------------------------------------------------------------------------------------------------  Recent Labs  01/14/16 1021  VITAMINB12 495    Coagulation profile No results for input(s): INR, PROTIME in the last 168 hours.  No results for input(s): DDIMER in the last 72 hours.  Cardiac Enzymes No results for input(s): CKMB, TROPONINI, MYOGLOBIN in the last 168 hours.  Invalid input(s): CK ------------------------------------------------------------------------------------------------------------------ No results found for: BNP   Edword Cu M.D on 01/15/2016 at 6:52 PM  Between 7am to 7pm - Pager - [phone]  After 7pm go to [email or web address] - password [email or web address]  [facility] -  Office  [phone]  Dragon dictation system was used to create this note, attempts have been made to correct errors, however presence of uncorrected errors is not a reflection quality of care provided

## 2016-01-15 NOTE — Progress Notes (Signed)
[id number] is having 8/10 back pain from lumbar puncture. Tylenol not due till 2300, MD paged to see if she can have something else for pain/ Awaiting response.

## 2016-01-15 NOTE — Progress Notes (Signed)
[name] is about the same. She did have the MRI done. This did seem to show problems with the pons. The radiologist felt that this could be consistent with some autoimmune type process.  Her CSF cytology was negative for any obvious lymphoma.  I have only found 1 case report of a patient with follicular lymphoma having cerebellar degeneration. This patient indicates report presented with neurological symptoms. The neurological symptoms improved with therapy for the lymphoma.  We have not yet found lymphoma with her area and her scans were -2 months ago. However, I think we have to re-evaluate her. I would set her up with some CT scans. We will see what they show. I suspect that she also may need to have a bone marrow done.  I cannot imagine that this came from her actual chemotherapy. Again, our pharmacist have not found any reports in which there was a delayed toxicity with leukoencephalopathy. I suppose disorders could happen.  On her physical exam, she does have a difficult time speaking. Not sure how well she is swallowing. Her vital signs are all stable. There is no adenopathy on her exam. She has no oral lesions. Lungs are clear. Cardiac exam regular rate and rhythm. Abdomen is soft. There is no palpable liver or spleen tip. Skin exam shows no rashes. She has a healed biopsy sites on her back. Neurological exam shows a cerebellar type changes.  This is still an absolutely fascinating situation. I just have no explanation as to why she has this outside of the fact that her lymphoma might be recurring. I found no evidence of this yet. We will certainly look.  We will defer to neurology to see if they want to start her on steroids.  I would not think that she would need any kind of brain biopsy area and sometimes I think this has been needed in cases where the etiology is unclear.  I know that paraneoplastic antibodies have been sent off. This may take several weeks before they return.  I  just feel horrible for [name]. She has had a terrible year with respect to her husband passing. For her do have to go through this is very hard to accept.  I very much appreciate all the great care that she is getting from everybody up on [facility]. You all are doing a great job.  Lattie Haw, MD  Psalm 32:8

## 2016-01-16 MED ORDER — BISACODYL 10 MG RE SUPP
10.0000 mg | Freq: Every day | RECTAL | Status: DC | PRN
Start: 2016-01-16 — End: 2016-01-28
  Administered 2016-01-16: 10 mg via RECTAL
  Filled 2016-01-16: qty 1

## 2016-01-16 MED ORDER — MINERAL OIL RE ENEM
1.0000 | ENEMA | Freq: Every day | RECTAL | Status: DC | PRN
  Administered 2016-01-16 – 2016-01-22 (×2): 1 via RECTAL
  Filled 2016-01-16 (×3): qty 1

## 2016-01-16 MED ORDER — TRAMADOL HCL 50 MG PO TABS
50.0000 mg | ORAL_TABLET | Freq: Once | ORAL | Status: AC
Start: 2016-01-16 — End: 2016-01-16
  Administered 2016-01-16: 50 mg via ORAL
  Filled 2016-01-16: qty 1

## 2016-01-16 NOTE — Progress Notes (Signed)
Subjective: No significant changes  Exam: Vitals:   01/16/16 0107 01/16/16 0523  BP: (!) 93/53 101/60  Pulse: 98 98  Resp: 16 16  Temp: 97.7 F (36.5 C) 97.8 F (36.6 C)   Gen: In bed, NAD Resp: non-labored breathing, no acute distress Abd: soft, nt  Neuro: MS: Awake, aler, t [id number], saccadic smooth pursuit Motor: 5/5 throughout Cerebellar: ataxia bilaterally    Impression: 45 yo F with subacute progressive cerebellar dysfunction. I suspect that the T2 change in her pons represents wallerian degeneration rather than being the causative lesion. Sent paraneoplastic panel to [facility].   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     Roland Rack, MD [facility] [phone]  If 7pm- 7am, please page neurology on call as listed in [place].

## 2016-01-16 NOTE — Progress Notes (Signed)
Patient ID: [name], female   DOB: 07/18/1970, 45 y.o.   MRN: [id number]                                                                PROGRESS NOTE                                                                                                                                                                                                             Patient Demographics:    [name], is a 45 y.o. female, DOB - 10-Oct-1970, [id number]  Admit date - 01/12/2016   Admitting Physician Theodis Blaze, MD  Outpatient Primary MD for the patient is Tammi Sou, MD  LOS - 3  Outpatient Specialists: Ulyses Jarred (neurologist)  Chief Complaint  Patient presents with  . Weakness       Brief Narrative  45 y.o. female, w difficulty with walking as well as generalized weakness x 4 weeks pt has been having increase in falls. Pt has more weakness in her lower ext than upper ext.  Pt denies neck pain but has pain in the thoracic spine, denies lower back pain.   Pt was told by Dr. Clydene Fake office to go to ER  In ED, pt had CT brain=> no acute process.  Left hip pain,  Xray => negative for fracture.  Neurology consulted.   Pt transferred to [facility].  MRI brain 8/18 => Interval progression of white matter signal abnormality, greatest in the pons, compared to the study 1 month earlier. These white matter lesions show no restriction, susceptibility, or postcontrast enhancement. 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     Subjective:    [name] today still has weakness, but feels slightly stronger.  No headache, No chest pain, No abdominal pain - No Nausea, No new weakness tingling or numbness, No Cough - SOB.    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 Paraneoplastic panel has been ordered by Dr. Kathrynn Speed.  . Discussed with Dr. Kathrynn Speed,  high-dose steroids (solu-medrol 1 gm daily) and IVIG for the next 5 days (started 01/15/16), CSF studies pending  2)H/o recurrent follicular continuous lymphoma (non-Hodgkin's)- last chemotherapy completed  05/2016, patient sees Dr. Burney Gauze.   Code Status : Full  Disposition Plan  : After completing high-dose steroids and IVIG for  5 days  Consults  :  Oncology/neurology   DVT Prophylaxis  :  Heparin   Lab Results  Component Value Date   PLT 261 01/13/2016    Antibiotics  :   Anti-infectives    None        Objective:   Vitals:   01/15/16 2149 01/15/16 2258 01/16/16 0107 01/16/16 0523  BP: (!) 101/49 (!) 98/51 (!) 93/53 101/60  Pulse: 82 86 98 98  Resp: 16 16 16 16   Temp: 98 F (36.7 C) 98 F (36.7 C) 97.7 F (36.5 C) 97.8 F (36.6 C)  TempSrc: Oral Oral Oral Oral  SpO2: 98% 97% 98% 97%  Weight:        Wt Readings from Last 3 Encounters:  01/15/16 47.7 kg (105 lb 2.6 oz)  12/30/15 48.9 kg (107 lb 12.8 oz)  12/27/15 48.8 kg (107 lb 8 oz)     Intake/Output Summary (Last 24 hours) at 01/16/16 0918 Last data filed at 01/16/16 0510  Gross per 24 hour  Intake              600 ml  Output                0 ml  Net              600 ml     Physical Exam  Awake Alert, Oriented X 3, No new F.N deficits, Normal affect [place].AT,PERRAL Supple Neck,No JVD, No cervical lymphadenopathy appriciated.  Symmetrical Chest wall movement, Good air movement bilaterally, CTAB RRR,No Gallops,Rubs or new Murmurs, No Parasternal Heave +ve B.Sounds, Abd Soft, No tenderness, No organomegaly appriciated, No rebound - guarding or  rigidity. No Cyanosis, Clubbing or edema, No new Rash or bruise   Strength 5-5/5 all 4 ext.     Data Review:    CBC  Recent Labs Lab 01/13/16 0015 01/13/16 0503  WBC 5.6 5.6  HGB 12.7 12.1  HCT 38.5 35.5*  PLT 285 261  MCV 90.2 89.4  MCH 29.7 30.5  MCHC 33.0 34.1  RDW 13.4 13.0  LYMPHSABS 0.8  --   MONOABS 0.6  --   EOSABS 0.2  --   BASOSABS 0.0  --     Chemistries   Recent Labs Lab 01/13/16 0015 01/13/16 0503  NA 136 139  K 3.7 4.0  CL 103 106  CO2 25 28  GLUCOSE 110* 105*  BUN 10 8  CREATININE 0.56 0.53  CALCIUM 9.3 9.4  AST 21 23  ALT 17 16  ALKPHOS 88 82  BILITOT 0.5 0.7   ------------------------------------------------------------------------------------------------------------------ No results for input(s): CHOL, HDL, LDLCALC, TRIG, CHOLHDL, LDLDIRECT in the last 72 hours.  No results found for: HGBA1C ------------------------------------------------------------------------------------------------------------------ No results for input(s): TSH, T4TOTAL, T3FREE, THYROIDAB in the last 72 hours.  Invalid input(s): FREET3 ------------------------------------------------------------------------------------------------------------------  Recent Labs  01/14/16 1021  VITAMINB12 495    Coagulation profile No results for input(s): INR, PROTIME in the last 168 hours.  No results for input(s): DDIMER in the  last 72 hours.  Cardiac Enzymes No results for input(s): CKMB, TROPONINI, MYOGLOBIN in the last 168 hours.  Invalid input(s): CK ------------------------------------------------------------------------------------------------------------------ No results found for: BNP  Inpatient Medications  Scheduled Meds: . heparin subcutaneous  5,000 Units Subcutaneous Q8H  . Immune Globulin 10%  400 mg/kg Intravenous Q24 Hr x 5  . methylPREDNISolone (SOLU-MEDROL) injection  1,000 mg Intravenous Daily  . polyethylene glycol  17 g Oral Daily  .  potassium chloride SA  20 mEq Oral Daily  . sodium chloride flush  3 mL Intravenous Q12H  . thiamine  100 mg Oral Daily  . Vitamin D (Ergocalciferol)  50,000 Units Oral Weekly   Continuous Infusions: . lactated ringers 10 mL/hr at 01/14/16 1538   PRN Meds:.acetaminophen **OR** acetaminophen, LORazepam  Micro Results No results found for this or any previous visit (from the past 240 hour(s)).  Radiology Reports Ct Head Wo Contrast  Result Date: 01/13/2016 CLINICAL DATA:  45 y/o F; increased weakness and difficulty with speech mobility over the last 3 weeks. History of non-Hodgkin's lymphoma. EXAM: CT HEAD WITHOUT CONTRAST TECHNIQUE: Contiguous axial images were obtained from the base of the skull through the vertex without intravenous contrast. COMPARISON:  MRI of the brain dated 12/20/2015. FINDINGS: Brain: No evidence of acute infarction, hemorrhage, hydrocephalus, extra-axial collection or mass lesion/mass effect. Moderate generalized atrophy for patient's age. Few lucencies in white matter and within the pons corresponding FLAIR signal abnormality on prior MRI and are nonspecific. Vascular: No hyperdense vessel or unexpected calcification. Skull: No fracture is identified.  No suspicious lesion. Sinuses/Orbits: No acute finding. Other: No unexpected finding. IMPRESSION: No acute intracranial abnormality is identified. Stable generalized volume loss and mild nonspecific white matter lucencies possibly representing microvascular disease or posttreatment changes. Electronically Signed   By: Kristine Garbe M.D.   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 Staging. Subsequent encounter. EXAM: CT NECK WITH CONTRAST TECHNIQUE: Multidetector CT imaging of the neck was performed using the standard protocol following the bolus administration of intravenous contrast. CONTRAST:   140mL ISOVUE-300 IOPAMIDOL (ISOVUE-300) INJECTION 61% in conjunction with contrast enhanced imaging of the chest, abdomen, and pelvis reported separately. COMPARISON:  Brain MRI 01/14/2016. Head CT without contrast 01/13/2016. PET-CT 11/03/2015. FINDINGS: Pharynx and larynx: Larynx is normal in best seen on series 6, image 70. Pharyngeal soft tissue contours are within normal limits. Negative parapharyngeal and retropharyngeal spaces. Salivary glands: Negative sublingual space, sublingual glands, submandibular glands, and parotid glands. Thyroid: Negative. Lymph nodes: No cervical lymphadenopathy. Only diminutive (4 mm short axis or smaller) bilateral lymph nodes are identified. Vascular: Major vascular structures in the neck and at skullbase are patent and appear normal. Limited intracranial: Stable from yesterday brain MRI. Visualized orbits: Negative. Mastoids and visualized paranasal sinuses: Visualized paranasal sinuses and mastoids are stable and well pneumatized. Skeleton: No osseous abnormality identified. Upper chest: Chest CT is reported separately today. IMPRESSION: 1. Negative neck CT.  No evidence of lymphoma in the neck. 2. CT chest abdomen and pelvis today reported separately. Electronically Signed   By: Genevie Ann M.D.   On: 01/15/2016 18:23   Ct Chest W Contrast  Result Date: 01/15/2016 CLINICAL DATA:  Recurrent lymphoma. EXAM: CT CHEST, ABDOMEN, AND PELVIS WITH CONTRAST TECHNIQUE: Multidetector CT imaging of the chest, abdomen and pelvis was performed following the standard protocol during bolus administration of intravenous contrast. CONTRAST:  154mL ISOVUE-300 IOPAMIDOL (ISOVUE-300)  INJECTION 61% COMPARISON:  None. FINDINGS: CT CHEST FINDINGS Cardiovascular: The heart and great vessels are normal. Mediastinum/Nodes: Normal.  No adenopathy. Lungs/Pleura: Minimal linear opacity anteriorly in the right lung on series 3, image 61 is stable and of doubtful significance. This may represent a tiny  region of scar. The lungs are otherwise normal. Musculoskeletal: Normal. CT ABDOMEN PELVIS FINDINGS Hepatobiliary: Normal. Pancreas: Normal. Spleen: Normal. Adrenals/Urinary Tract: Normal. Stomach/Bowel: The stomach and small bowel are normal. Moderate fecal loading is seen throughout the colon. The colon is otherwise normal. The cecum is seen in the right side of the pelvis. Visualized portions of the appendix are normal. Vascular/Lymphatic: No aneurysm or dissection.  No adenopathy. Reproductive: The patient is status post tubal ligation. The uterus and adnexae are normal. Other: No other abnormalities. Musculoskeletal: No acute bony abnormalities. IMPRESSION: 1. No evidence of lymphoma recurrence in the chest, abdomen, or pelvis. 2. Moderate fecal loading throughout the colon. 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 Recent PET scan was negative for disease. Recent onset of difficulty walking and speech issues. 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 EXAM: MRI HEAD WITHOUT AND WITH CONTRAST TECHNIQUE: Multiplanar, multiecho pulse sequences of the brain and surrounding structures were obtained without and with intravenous contrast. CONTRAST:  42mL MULTIHANCE GADOBENATE DIMEGLUMINE 529 MG/ML IV SOLN COMPARISON:  12/20/2015. FINDINGS: No acute stroke or hemorrhage.  No mass lesion or extra-axial fluid. Global atrophy, hydrocephalus ex vacuo. Significant premature brain substance loss affecting the cerebral hemispheres and cerebellum. Premature for age subcortical greater than periventricular white matter signal abnormality, also involving the pons. No definite involvement of the cerebellar white matter. There is moderate interval progression of the T2 and FLAIR hyperintensity in the pons, and to a lesser degree,  progression in the subcortical white matter, compared with the study 1 month earlier. These show no restriction, susceptibility, or postcontrast enhancement. Partial empty sella. Flow voids are maintained. Extracranial soft tissues are unremarkable. Post infusion imaging through the entire head demonstrates no abnormal enhancement of brain or meninges. IMPRESSION: Interval progression of white matter signal abnormality, greatest in the pons, compared to the study 1 month earlier. These white matter lesions show no restriction, susceptibility, or postcontrast enhancement. 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 Global atrophy affecting the cerebral hemispheres, brainstem, and cerebellum, stable from priors. No areas of restricted diffusion or postcontrast enhancement. Electronically Signed   By: Staci Righter M.D.   On: 01/14/2016 18:37   Mr Jeri Cos [id number] Contrast  Result Date: 12/20/2015 CLINICAL DATA:  Blurred vision with difficulty walking. History of non-Hodgkin's lymphoma and chemotherapy. EXAM: MRI HEAD WITHOUT AND WITH CONTRAST TECHNIQUE: Multiplanar, multiecho pulse sequences of the brain and surrounding structures were obtained without and with intravenous contrast. CONTRAST:  75mL MULTIHANCE GADOBENATE DIMEGLUMINE 529 MG/ML IV SOLN COMPARISON:  None. FINDINGS: Mild atrophy.  Mild ventricular enlargement consistent with atrophy. Negative for acute infarct. Scattered small hyperintensities in the deep white matter bilaterally and in the pons. This may be related to prior chemotherapy or chronic ischemia. Negative for intracranial hemorrhage.  No fluid collection Negative for mass or edema Postcontrast imaging demonstrates normal enhancement. No enhancing mass. Normal venous enhancement. Mild mucosal edema paranasal sinuses. Normal orbit. Pituitary not enlarged. IMPRESSION: No acute intracranial abnormality Generalized atrophy. Chronic appearing changes in the  white matter  and pons which may be related to prior chemotherapy or chronic microvascular ischemia. Electronically Signed   By: Franchot Gallo M.D.   On: 12/20/2015 15:33  Ct Abdomen Pelvis W Contrast  Result Date: 01/15/2016 CLINICAL DATA:  Recurrent lymphoma. EXAM: CT CHEST, ABDOMEN, AND PELVIS WITH CONTRAST TECHNIQUE: Multidetector CT imaging of the chest, abdomen and pelvis was performed following the standard protocol during bolus administration of intravenous contrast. CONTRAST:  156mL ISOVUE-300 IOPAMIDOL (ISOVUE-300) INJECTION 61% COMPARISON:  None. FINDINGS: CT CHEST FINDINGS Cardiovascular: The heart and great vessels are normal. Mediastinum/Nodes: Normal.  No adenopathy. Lungs/Pleura: Minimal linear opacity anteriorly in the right lung on series 3, image 61 is stable and of doubtful significance. This may represent a tiny region of scar. The lungs are otherwise normal. Musculoskeletal: Normal. CT ABDOMEN PELVIS FINDINGS Hepatobiliary: Normal. Pancreas: Normal. Spleen: Normal. Adrenals/Urinary Tract: Normal. Stomach/Bowel: The stomach and small bowel are normal. Moderate fecal loading is seen throughout the colon. The colon is otherwise normal. The cecum is seen in the right side of the pelvis. Visualized portions of the appendix are normal. Vascular/Lymphatic: No aneurysm or dissection.  No adenopathy. Reproductive: The patient is status post tubal ligation. The uterus and adnexae are normal. Other: No other abnormalities. Musculoskeletal: No acute bony abnormalities. IMPRESSION: 1. No evidence of lymphoma recurrence in the chest, abdomen, or pelvis. 2. Moderate fecal loading throughout the colon. Electronically Signed   By: Dorise Bullion III M.D   On: 01/15/2016 18:30   Dg Hip Unilat With Pelvis 2-3 Views Left  Result Date: 01/13/2016 CLINICAL DATA:  45 y/o  F; status post fall with anterior hip pain. EXAM: DG HIP (WITH OR WITHOUT PELVIS) 2-3V LEFT COMPARISON:  None. FINDINGS: There is no  evidence of hip fracture or dislocation. There is no evidence of arthropathy or other focal bone abnormality. Clips project over the pelvis. IMPRESSION: No acute fracture or dislocation is identified. Electronically Signed   By: Kristine Garbe M.D.   On: 01/13/2016 02:07    Time Spent in minutes  30   Jani Gravel M.D on 01/16/2016 at 9:18 AM  Between 7am to 7pm - Pager - [phone]  After 7pm go to [email or web address] - password [facility]  [facility] -  Office  [phone]

## 2016-01-17 ENCOUNTER — Encounter (HOSPITAL_COMMUNITY): Payer: Self-pay | Admitting: Radiology

## 2016-01-17 DIAGNOSIS — R269 Unspecified abnormalities of gait and mobility: Secondary | ICD-10-CM

## 2016-01-17 LAB — BETA 2 MICROGLOBULIN, SERUM: BETA 2 MICROGLOBULIN: 1.5 mg/L (ref 0.6–2.4)

## 2016-01-17 LAB — MISC LABCORP TEST (SEND OUT): Labcorp test code: 813940

## 2016-01-17 LAB — VITAMIN E: ALPHA-TOCOPHEROL: 8 mg/L (ref 5.3–16.8)

## 2016-01-17 MED ORDER — HEPARIN SODIUM (PORCINE) 5000 UNIT/ML IJ SOLN
5000.0000 [IU] | Freq: Three times a day (TID) | INTRAMUSCULAR | Status: DC
  Administered 2016-01-19 – 2016-01-28 (×27): 5000 [IU] via SUBCUTANEOUS
  Filled 2016-01-17 (×26): qty 1

## 2016-01-17 MED ORDER — TRAMADOL HCL 50 MG PO TABS
50.0000 mg | ORAL_TABLET | Freq: Three times a day (TID) | ORAL | Status: DC | PRN
  Administered 2016-01-17 – 2016-01-26 (×13): 50 mg via ORAL
  Filled 2016-01-17 (×14): qty 1

## 2016-01-17 MED ORDER — PANTOPRAZOLE SODIUM 40 MG PO TBEC
40.0000 mg | DELAYED_RELEASE_TABLET | Freq: Every day | ORAL | Status: DC
  Administered 2016-01-17 – 2016-01-26 (×11): 40 mg via ORAL
  Filled 2016-01-17 (×10): qty 1

## 2016-01-17 MED FILL — Lactated Ringer's Solution: INTRAVENOUS | Qty: 1000 | Status: AC

## 2016-01-17 MED FILL — Succinylcholine Chloride Inj 20 MG/ML: INTRAMUSCULAR | Qty: 10 | Status: AC

## 2016-01-17 MED FILL — Fentanyl Citrate Preservative Free (PF) Inj 100 MCG/2ML: INTRAMUSCULAR | Qty: 2 | Status: AC

## 2016-01-17 MED FILL — Midazolam HCl Inj 2 MG/2ML (Base Equivalent): INTRAMUSCULAR | Qty: 2 | Status: AC

## 2016-01-17 MED FILL — Propofol IV Emul 500 MG/50ML (10 MG/ML): INTRAVENOUS | Qty: 50 | Status: AC

## 2016-01-17 MED FILL — Lidocaine HCl IV Inj 20 MG/ML: INTRAVENOUS | Qty: 5 | Status: AC

## 2016-01-17 NOTE — Progress Notes (Signed)
Patient ID: [name], female   DOB: 1970-12-25, 45 y.o.   MRN: [id number]                                                                PROGRESS NOTE                                                                                                                                                                                                             Patient Demographics:    [name], is a 45 y.o. female, DOB - [birth year], FOY:[id number]  Admit date - 01/12/2016   Admitting Physician Theodis Blaze, MD  Outpatient Primary MD for the patient is Tammi Sou, MD  LOS - 4  Outpatient Specialists: Ulyses Jarred (neurologist)  Chief Complaint  Patient presents with  . Weakness       Brief Narrative  45 y.o. female, w difficulty with walking as well as generalized weakness x 4 weeks pt has been having increase in falls. Pt has more weakness in her lower ext than upper ext.  Pt denies neck pain but has pain in the thoracic spine, denies lower back pain.   Pt was told by Dr. Clydene Fake office to go to ER  In ED, pt had CT brain=> no acute process.  Left hip pain,  Xray => negative for fracture.  Neurology consulted.   Pt transferred to [facility].  MRI brain 8/18 => Interval progression of white matter signal abnormality, greatest in the pons, compared to the study 1 month earlier. These white matter lesions show no restriction, susceptibility, or postcontrast enhancement. 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     Subjective:    [name] today still has weakness, No headache, No chest pain, No abdominal pain .   Assessment  & Plan :    Principal Problem:   Weakness Active Problems:   Ataxia   Ataxia/Gait problems with subacute  progressive cerebral dysfunction - MRI brain from 01/14/16 shows abnormal T2 signal in the pons . 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 Paraneoplastic panel still pending. 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  H/o recurrent follicular continuous lymphoma (non-Hodgkin's) - last chemotherapy completed  05/2016 - Neurology input greatly appreciated, CT scans with no evidence of lymphoma recurrence, PET scan 2 months. Preadmission with no evidence of recurrence, LP with no evidence of malignant cells, plan for bone marrow biopsy tomorrow by IR.  Code Status : Full  Family communication: Discussed with mother at bedside  Disposition Plan  :  pending further workup  Consults  :  Oncology/neurology  DVT Prophylaxis  :  Heparin   Lab Results  Component Value Date   PLT 261 01/13/2016    Antibiotics  :   Anti-infectives    None        Objective:   Vitals:   01/17/16 0112 01/17/16 0417 01/17/16 0500 01/17/16 1030  BP: 95/60  (!) 106/54 (!) 108/57  Pulse: (!) 121  88 86  Resp: '16  16 20  '$ Temp: 98.2 F (36.8 C)  97.8 F (36.6 C) 98.3 F (36.8 C)  TempSrc: Oral  Oral Oral  SpO2: 99%  98% 97%  Weight:  51.1 kg (112 lb 9 oz)      Wt Readings from Last 3 Encounters:  01/17/16 51.1 kg (112 lb 9 oz)  12/30/15 48.9 kg (107 lb 12.8 oz)  12/27/15 48.8 kg (107 lb 8 oz)     Intake/Output Summary (Last 24 hours) at 01/17/16 1139 Last data filed at 01/17/16 1120  Gross per 24 hour  Intake              480 ml  Output                5 ml  Net              475 ml     Physical Exam  Sleepy, but wakes up, open her eyes, slow. Supple Neck,No JVD Symmetrical Chest wall movement, Good air movement bilaterally, CTAB RRR,No Gallops,Rubs or new Murmurs, No Parasternal Heave +ve B.Sounds, Abd Soft, No tenderness, No organomegaly appriciated, No rebound -  guarding or rigidity. No Cyanosis, Clubbing or edema, No new Rash or bruise  , poor coordination, dysmetria.    Data Review:    CBC  Recent Labs Lab 01/13/16 0015 01/13/16 0503  WBC 5.6 5.6  HGB 12.7 12.1  HCT 38.5 35.5*  PLT 285 261  MCV 90.2 89.4  MCH 29.7 30.5  MCHC 33.0 34.1  RDW 13.4 13.0  LYMPHSABS 0.8  --   MONOABS 0.6  --   EOSABS 0.2  --   BASOSABS 0.0  --     Chemistries   Recent Labs Lab 01/13/16 0015 01/13/16 0503  NA 136 139  K 3.7 4.0  CL 103 106  CO2 25 28  GLUCOSE 110* 105*  BUN 10 8  CREATININE 0.56 0.53  CALCIUM 9.3 9.4  AST 21 23  ALT 17 16  ALKPHOS 88 82  BILITOT 0.5 0.7   ------------------------------------------------------------------------------------------------------------------ No results for input(s): CHOL, HDL, LDLCALC, TRIG, CHOLHDL, LDLDIRECT in the last 72 hours.  No results found for: HGBA1C ------------------------------------------------------------------------------------------------------------------ No results for input(s): TSH, T4TOTAL, T3FREE, THYROIDAB in the last 72 hours.  Invalid input(s): FREET3 ------------------------------------------------------------------------------------------------------------------ No results for input(s): VITAMINB12, FOLATE, FERRITIN, TIBC, IRON, RETICCTPCT in the last 72 hours.  Coagulation profile No results for input(s): INR, PROTIME in the last 168 hours.  No results for input(s):  DDIMER in the last 72 hours.  Cardiac Enzymes No results for input(s): CKMB, TROPONINI, MYOGLOBIN in the last 168 hours.  Invalid input(s): CK ------------------------------------------------------------------------------------------------------------------ No results found for: BNP  Inpatient Medications  Scheduled Meds: . [START ON 01/19/2016] heparin subcutaneous  5,000 Units Subcutaneous Q8H  . Immune Globulin 10%  400 mg/kg Intravenous Q24 Hr x 5  . methylPREDNISolone (SOLU-MEDROL)  injection  1,000 mg Intravenous Daily  . polyethylene glycol  17 g Oral Daily  . potassium chloride SA  20 mEq Oral Daily  . sodium chloride flush  3 mL Intravenous Q12H  . thiamine  100 mg Oral Daily  . Vitamin D (Ergocalciferol)  50,000 Units Oral Weekly   Continuous Infusions: . lactated ringers 10 mL/hr at 01/14/16 1538   PRN Meds:.acetaminophen **OR** acetaminophen, bisacodyl, LORazepam, mineral oil  Micro Results No results found for this or any previous visit (from the past 240 hour(s)).  Radiology Reports Ct Head Wo Contrast  Result Date: 01/13/2016 CLINICAL DATA:  45 y/o F; increased weakness and difficulty with speech mobility over the last 3 weeks. History of non-Hodgkin's lymphoma. EXAM: CT HEAD WITHOUT CONTRAST TECHNIQUE: Contiguous axial images were obtained from the base of the skull through the vertex without intravenous contrast. COMPARISON:  MRI of the brain dated 12/20/2015. FINDINGS: Brain: No evidence of acute infarction, hemorrhage, hydrocephalus, extra-axial collection or mass lesion/mass effect. Moderate generalized atrophy for patient's age. Few lucencies in white matter and within the pons corresponding FLAIR signal abnormality on prior MRI and are nonspecific. Vascular: No hyperdense vessel or unexpected calcification. Skull: No fracture is identified.  No suspicious lesion. Sinuses/Orbits: No acute finding. Other: No unexpected finding. IMPRESSION: No acute intracranial abnormality is identified. Stable generalized volume loss and mild nonspecific white matter lucencies possibly representing microvascular disease or posttreatment changes. Electronically Signed   By: Kristine Garbe M.D.   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 Staging. Subsequent encounter. EXAM: CT NECK WITH CONTRAST TECHNIQUE: Multidetector CT imaging of the neck was  performed using the standard protocol following the bolus administration of intravenous contrast. CONTRAST:  116m ISOVUE-300 IOPAMIDOL (ISOVUE-300) INJECTION 61% in conjunction with contrast enhanced imaging of the chest, abdomen, and pelvis reported separately. COMPARISON:  Brain MRI 01/14/2016. Head CT without contrast 01/13/2016. PET-CT 11/03/2015. FINDINGS: Pharynx and larynx: Larynx is normal in best seen on series 6, image 70. Pharyngeal soft tissue contours are within normal limits. Negative parapharyngeal and retropharyngeal spaces. Salivary glands: Negative sublingual space, sublingual glands, submandibular glands, and parotid glands. Thyroid: Negative. Lymph nodes: No cervical lymphadenopathy. Only diminutive (4 mm short axis or smaller) bilateral lymph nodes are identified. Vascular: Major vascular structures in the neck and at skullbase are patent and appear normal. Limited intracranial: Stable from yesterday brain MRI. Visualized orbits: Negative. Mastoids and visualized paranasal sinuses: Visualized paranasal sinuses and mastoids are stable and well pneumatized. Skeleton: No osseous abnormality identified. Upper chest: Chest CT is reported separately today. IMPRESSION: 1. Negative neck CT.  No evidence of lymphoma in the neck. 2. CT chest abdomen and pelvis today reported separately. Electronically Signed   By: HGenevie AnnM.D.   On: 01/15/2016 18:23   Ct Chest W Contrast  Result Date: 01/15/2016 CLINICAL DATA:  Recurrent lymphoma. EXAM: CT CHEST, ABDOMEN, AND PELVIS WITH CONTRAST TECHNIQUE: Multidetector CT imaging of the chest, abdomen and pelvis was performed following the standard protocol during bolus administration  of intravenous contrast. CONTRAST:  161m ISOVUE-300 IOPAMIDOL (ISOVUE-300) INJECTION 61% COMPARISON:  None. FINDINGS: CT CHEST FINDINGS Cardiovascular: The heart and great vessels are normal. Mediastinum/Nodes: Normal.  No adenopathy. Lungs/Pleura: Minimal linear opacity anteriorly  in the right lung on series 3, image 61 is stable and of doubtful significance. This may represent a tiny region of scar. The lungs are otherwise normal. Musculoskeletal: Normal. CT ABDOMEN PELVIS FINDINGS Hepatobiliary: Normal. Pancreas: Normal. Spleen: Normal. Adrenals/Urinary Tract: Normal. Stomach/Bowel: The stomach and small bowel are normal. Moderate fecal loading is seen throughout the colon. The colon is otherwise normal. The cecum is seen in the right side of the pelvis. Visualized portions of the appendix are normal. Vascular/Lymphatic: No aneurysm or dissection.  No adenopathy. Reproductive: The patient is status post tubal ligation. The uterus and adnexae are normal. Other: No other abnormalities. Musculoskeletal: No acute bony abnormalities. IMPRESSION: 1. No evidence of lymphoma recurrence in the chest, abdomen, or pelvis. 2. Moderate fecal loading throughout the colon. 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 Recent PET scan was negative for disease. Recent onset of difficulty walking and speech issues. 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 EXAM: MRI HEAD WITHOUT AND WITH CONTRAST TECHNIQUE: Multiplanar, multiecho pulse sequences of the brain and surrounding structures were obtained without and with intravenous contrast. CONTRAST:  152mMULTIHANCE GADOBENATE DIMEGLUMINE 529 MG/ML IV SOLN COMPARISON:  12/20/2015. FINDINGS: No acute stroke or hemorrhage.  No mass lesion or extra-axial fluid. Global atrophy, hydrocephalus ex vacuo. Significant premature brain substance loss affecting the cerebral hemispheres and cerebellum. Premature for age subcortical greater than periventricular white matter signal abnormality, also involving the pons. No definite involvement of the cerebellar white matter. There is  moderate interval progression of the T2 and FLAIR hyperintensity in the pons, and to a lesser degree, progression in the subcortical white matter, compared with the study 1 month earlier. These show no restriction, susceptibility, or postcontrast enhancement. Partial empty sella. Flow voids are maintained. Extracranial soft tissues are unremarkable. Post infusion imaging through the entire head demonstrates no abnormal enhancement of brain or meninges. IMPRESSION: Interval progression of white matter signal abnormality, greatest in the pons, compared to the study 1 month earlier. These white matter lesions show no restriction, susceptibility, or postcontrast enhancement. 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 Global atrophy affecting the cerebral hemispheres, brainstem, and cerebellum, stable from priors. No areas of restricted diffusion or postcontrast enhancement. Electronically Signed   By: JoStaci Righter.D.   On: 01/14/2016 18:37   Mr BrJeri CosoPZontrast  Result Date: 12/20/2015 CLINICAL DATA:  Blurred vision with difficulty walking. History of non-Hodgkin's lymphoma and chemotherapy. EXAM: MRI HEAD WITHOUT AND WITH CONTRAST TECHNIQUE: Multiplanar, multiecho pulse sequences of the brain and surrounding structures were obtained without and with intravenous contrast. CONTRAST:  1045mULTIHANCE GADOBENATE DIMEGLUMINE 529 MG/ML IV SOLN COMPARISON:  None. FINDINGS: Mild atrophy.  Mild ventricular enlargement consistent with atrophy. Negative for acute infarct. Scattered small hyperintensities in the deep white matter bilaterally and in the pons. This may be related to prior chemotherapy or chronic ischemia. Negative for intracranial hemorrhage.  No fluid collection Negative for mass or edema Postcontrast imaging demonstrates normal enhancement. No enhancing mass. Normal venous enhancement. Mild mucosal edema paranasal sinuses. Normal orbit. Pituitary not  enlarged. IMPRESSION: No acute intracranial abnormality  Generalized atrophy. Chronic appearing changes in the white matter and pons which may be related to prior chemotherapy or chronic microvascular ischemia. Electronically Signed   By: Franchot Gallo M.D.   On: 12/20/2015 15:33  Ct Abdomen Pelvis W Contrast  Result Date: 01/15/2016 CLINICAL DATA:  Recurrent lymphoma. EXAM: CT CHEST, ABDOMEN, AND PELVIS WITH CONTRAST TECHNIQUE: Multidetector CT imaging of the chest, abdomen and pelvis was performed following the standard protocol during bolus administration of intravenous contrast. CONTRAST:  159m ISOVUE-300 IOPAMIDOL (ISOVUE-300) INJECTION 61% COMPARISON:  None. FINDINGS: CT CHEST FINDINGS Cardiovascular: The heart and great vessels are normal. Mediastinum/Nodes: Normal.  No adenopathy. Lungs/Pleura: Minimal linear opacity anteriorly in the right lung on series 3, image 61 is stable and of doubtful significance. This may represent a tiny region of scar. The lungs are otherwise normal. Musculoskeletal: Normal. CT ABDOMEN PELVIS FINDINGS Hepatobiliary: Normal. Pancreas: Normal. Spleen: Normal. Adrenals/Urinary Tract: Normal. Stomach/Bowel: The stomach and small bowel are normal. Moderate fecal loading is seen throughout the colon. The colon is otherwise normal. The cecum is seen in the right side of the pelvis. Visualized portions of the appendix are normal. Vascular/Lymphatic: No aneurysm or dissection.  No adenopathy. Reproductive: The patient is status post tubal ligation. The uterus and adnexae are normal. Other: No other abnormalities. Musculoskeletal: No acute bony abnormalities. IMPRESSION: 1. No evidence of lymphoma recurrence in the chest, abdomen, or pelvis. 2. Moderate fecal loading throughout the colon. Electronically Signed   By: DDorise BullionIII M.D   On: 01/15/2016 18:30   Dg Hip Unilat With Pelvis 2-3 Views Left  Result Date: 01/13/2016 CLINICAL DATA:  45y/o  F; status post fall with  anterior hip pain. EXAM: DG HIP (WITH OR WITHOUT PELVIS) 2-3V LEFT COMPARISON:  None. FINDINGS: There is no evidence of hip fracture or dislocation. There is no evidence of arthropathy or other focal bone abnormality. Clips project over the pelvis. IMPRESSION: No acute fracture or dislocation is identified. Electronically Signed   By: LKristine GarbeM.D.   On: 01/13/2016 02:07      Plez Belton M.D on 01/17/2016 at 11:39 AM  Between 7am to 7pm - Pager - [phone] After 7pm go to [email or web address] - password [facility] -  Office  [phone]

## 2016-01-17 NOTE — Progress Notes (Signed)
Subjective:  [name] is resting comfortably in bed. She reports no new complaints.  Exam: Vitals:   01/17/16 0112 01/17/16 0500  BP: 95/60 (!) 106/54  Pulse: (!) 121 88  Resp: 16 16  Temp: 98.2 F (36.8 C) 97.8 F (36.6 C)    HEENT-  Normocephalic, no lesions, without obvious abnormality.  Normal external eye and conjunctiva.  Normal TM's bilaterally.  Normal auditory canals and external ears. Normal external nose, mucus membranes and septum.  Normal pharynx. Cardiovascular- regular rate and rhythm, S1, S2 normal, no murmur, click, rub or gallop, pulses palpable throughout   Lungs- chest clear, no wheezing, rales, normal symmetric air entry, Heart exam - S1, S2 normal, no murmur, no gallop, rate regular Abdomen- soft, non-tender; bowel sounds normal; no masses,  no organomegaly Extremities- less then 2 second capillary refill Lymph-no adenopathy palpable Musculoskeletal-no joint tenderness, deformity or swelling Skin-warm and dry, no hyperpigmentation, vitiligo, or suspicious lesions    Gen: In bed, NAD MS: Awake alert and oriented. Baseline confusion. CN: Pupils are equal and reactive. Bilateral horizontal nystagmus is noted. There is no facial asymmetry. Motor: 5 out of 5 bilaterally. Sensory: Sensation is intact. DTR: 1-2+. Cerebellar: Incoordination is noted on finger to nose testing. Dysmetria is present.  Pertinent Labs/Diagnostics: Reviewed.    Impression:   [name] appears at her baseline. There is a degree of encephalopathy. There is evidence of cerebellar dysfunction. The paraneoplastic panel is pending at this time. The cytology panel was negative.   Recommendations:  1. We'll follow her progress. Await results of paraneoplastic panel.   Isham Smitherman A. Tasia Catchings, M.D. Neurohospitalist Phone: [phone]   01/17/2016, 9:36 AM

## 2016-01-17 NOTE — Progress Notes (Signed)
[name] has been started on IVIG.  I think that this is a great idea!!  Her CT scans do not show any obvious lymphoma recurrence. I think that we have to do a bone marrow test on her to make sure that she does not have any bone marrow involvement by lymphoma.  She seemed to tolerate the IVIG pretty well.  She's not hurting. She's having no nausea or vomiting. I think she is eating okay. I don't think there is any issues with aspiration.  Her physical exam is pretty much unchanged. Her blood pressure is 106/54. Pulse is 88. Temperature 97.8. There is no adenopathy in the neck. Lungs are clear. Cardiac exam regular rate and rhythm with no murmurs, rubs or bruits. Abdomen is softly she has good bowel sounds. There is no fluid wave. There is no palpable liver or spleen tip. Back exam shows no tenderness over the spine, ribs or hips. Skin exam shows no rashes. Neurological exam shows the cerebellar issues.  Again, I want to make sure that there is no obvious lymphoma. I think the last test we can do is a bone marrow test. We will have radiology do this for [place]. It can be done tomorrow and we should have the results by Thursday.  I just feel bad for her. I know that she is trying hard. There is just been so much going on with her. There's been a ton of stress that she has managed incredibly well.  I very much appreciate all the wonderful care that she is getting for everybody up on [address]. Your doing a great job with her.  Lattie Haw, MD  [place] 10:27

## 2016-01-17 NOTE — Consult Note (Signed)
Chief Complaint: Patient was seen in consultation today for bone marrow biopsy Chief Complaint  Patient presents with  . Weakness   at the request of Dr Burney Gauze  Referring Physician(s): Dr Burney Gauze  Supervising Physician: Corrie Mckusick  Patient Status: Inpatient  History of Present Illness: [name] is a 45 y.o. 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 Adult ADHD   . Anxiety and depression   . 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 Leonie Man.  Paraneoplastic lab panel pending.  Marland Kitchen HSV-2 (herpes simplex virus 2) infection   . Menorrhagia    Regular: 7 days of bleeding (2 heavy and 5 light).  . 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  Initial dx age 22--treated with chemo.  Recurrence x 2 after the birth of each of her children--different chemo regimen used.  Recurrence again spring 2016-in the mid/upper back, and as of 10/2015 PET scan showed no sign of dz.  Dr. Marin Olp wants her on maintenance Gavyza to prevent recurrence but as of 12/27/15 pt has not chosen to do this yet.  . Tobacco dependence in remission     Past Surgical History:  Procedure Laterality Date  . BASAL CELL CARCINOMA EXCISION    . COLONOSCOPY  appox age [age] yrs   Normal  . ENDOMETRIAL ABLATION  06/2008  . HYSTEROSCOPY  08/2007   polyp  . RADIOLOGY WITH ANESTHESIA N/A 01/14/2016   Procedure: RADIOLOGY WITH ANESTHESIA;  Surgeon: Medication Radiologist, MD;  Location: [facility];  Service: Radiology;  Laterality: N/A;  . TUBAL LIGATION      Allergies: Review of patient's allergies indicates no known allergies.  Medications: Prior to  Admission medications   Medication Sig Start Date End Date Taking? Authorizing Provider  ergocalciferol (VITAMIN D2) 50000 UNITS capsule Take 1 capsule (50,000 Units total) by mouth once a week. 04/19/15  Yes Volanda Napoleon, MD  LORazepam (ATIVAN) 1 MG tablet Take 1 tablet (1 mg total) by mouth every 8 (eight) hours. Patient taking differently: Take 1 mg by mouth every 8 (eight) hours as needed for anxiety.  11/24/15  Yes Volanda Napoleon, MD  potassium chloride SA (K-DUR,KLOR-CON) 20 MEQ tablet One tablet once every three days. 10/26/15  Yes Volanda Napoleon, MD     Family History  Problem Relation Age of Onset  . Hyperlipidemia Mother   . CAD Father   . Alcohol abuse Maternal Grandfather   . Cancer Paternal Grandmother   . Heart disease Paternal Grandfather   . Cancer Paternal Uncle     colon    Social History   Social History  . Marital status: Married    Spouse name: N/A  . Number of children: N/A  . Years of education: N/A   Social History Main Topics  . Smoking status: Former Smoker    Packs/day: 0.50    Years: 10.00    Types: Cigarettes    Quit date: 09/08/2012  . Smokeless tobacco: Never Used     Comment: Quit smoking 2 years ago  . Alcohol use No  . Drug use: No  . Sexual activity: Yes    Birth  control/ protection: Surgical   Other Topics Concern  . None   Social History Narrative   Married, 2 children.   Orig from [place] area of .   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   Tob 15 pack-yr hx.   Alcohol: a few drinks on weekend.   Drugs: none     Review of Systems: A 12 point ROS discussed and pertinent positives are indicated in the HPI above.  All other systems are negative.  Review of Systems  Constitutional: Positive for activity change, appetite change and fatigue. Negative for fever and unexpected weight change.  HENT: Negative for trouble swallowing and voice change.   Respiratory: Negative for cough and shortness of breath.     Cardiovascular: Negative for chest pain.  Gastrointestinal: Negative for abdominal pain.  Musculoskeletal: Positive for gait problem.  Neurological: Positive for speech difficulty and weakness. Negative for dizziness, tremors, seizures, facial asymmetry, light-headedness, numbness and headaches.  Psychiatric/Behavioral: Positive for confusion and decreased concentration. Negative for agitation and behavioral problems.    Vital Signs: BP (!) 106/54 (BP Location: Right Arm)   Pulse 88   Temp 97.8 F (36.6 C) (Oral)   Resp 16   Wt 112 lb 9 oz (51.1 kg)   SpO2 98%   BMI 20.59 kg/m   Physical Exam  Cardiovascular: Normal rate and regular rhythm.   Pulmonary/Chest: Effort normal.  Abdominal: Bowel sounds are normal.  Musculoskeletal: Normal range of motion.  Neurological: She is alert.  Pt does answer all questions correctly Is aware of DOB; place; location  Skin: Skin is warm and dry.  Psychiatric:  Consented with mother at bedside  Nursing note and vitals reviewed.   Mallampati Score:  MD Evaluation Airway: WNL Heart: WNL Abdomen: WNL Chest/ Lungs: WNL ASA  Classification: 3 Mallampati/Airway Score: One  Imaging: Ct Head Wo Contrast  Result Date: 01/13/2016 CLINICAL DATA:  45 y/o F; increased weakness and difficulty with speech mobility over the last 3 weeks. History of non-Hodgkin's lymphoma. EXAM: CT HEAD WITHOUT CONTRAST TECHNIQUE: Contiguous axial images were obtained from the base of the skull through the vertex without intravenous contrast. COMPARISON:  MRI of the brain dated 12/20/2015. FINDINGS: Brain: No evidence of acute infarction, hemorrhage, hydrocephalus, extra-axial collection or mass lesion/mass effect. Moderate generalized atrophy for patient's age. Few lucencies in white matter and within the pons corresponding FLAIR signal abnormality on prior MRI and are nonspecific. Vascular: No hyperdense vessel or unexpected calcification. Skull: No fracture is  identified.  No suspicious lesion. Sinuses/Orbits: No acute finding. Other: No unexpected finding. IMPRESSION: No acute intracranial abnormality is identified. Stable generalized volume loss and mild nonspecific white matter lucencies possibly representing microvascular disease or posttreatment changes. Electronically Signed   By: Kristine Garbe M.D.   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 Staging. Subsequent encounter. EXAM: CT NECK WITH CONTRAST TECHNIQUE: Multidetector CT imaging of the neck was performed using the standard protocol following the bolus administration of intravenous contrast. CONTRAST:  157m ISOVUE-300 IOPAMIDOL (ISOVUE-300) INJECTION 61% in conjunction with contrast enhanced imaging of the chest, abdomen, and pelvis reported separately. COMPARISON:  Brain MRI 01/14/2016. Head CT without contrast 01/13/2016. PET-CT 11/03/2015. FINDINGS: Pharynx and larynx: Larynx is normal in best seen on series 6, image 70. Pharyngeal soft tissue contours are within normal limits. Negative parapharyngeal and retropharyngeal spaces. Salivary glands: Negative sublingual space, sublingual glands, submandibular  glands, and parotid glands. Thyroid: Negative. Lymph nodes: No cervical lymphadenopathy. Only diminutive (4 mm short axis or smaller) bilateral lymph nodes are identified. Vascular: Major vascular structures in the neck and at skullbase are patent and appear normal. Limited intracranial: Stable from yesterday brain MRI. Visualized orbits: Negative. Mastoids and visualized paranasal sinuses: Visualized paranasal sinuses and mastoids are stable and well pneumatized. Skeleton: No osseous abnormality identified. Upper chest: Chest CT is reported separately today. IMPRESSION: 1. Negative neck CT.  No evidence of lymphoma in the neck. 2. CT chest abdomen and pelvis today reported  separately. Electronically Signed   By: Odessa Fleming M.D.   On: 01/15/2016 18:23   Ct Chest W Contrast  Result Date: 01/15/2016 CLINICAL DATA:  Recurrent lymphoma. EXAM: CT CHEST, ABDOMEN, AND PELVIS WITH CONTRAST TECHNIQUE: Multidetector CT imaging of the chest, abdomen and pelvis was performed following the standard protocol during bolus administration of intravenous contrast. CONTRAST:  ISOVUE-300 IOPAMIDOL (ISOVUE-300) INJECTION 61% COMPARISON:  None. FINDINGS: CT CHEST FINDINGS Cardiovascular: The heart and great vessels are normal. Mediastinum/Nodes: Normal.  No adenopathy. Lungs/Pleura: Minimal linear opacity anteriorly in the right lung on series 3, image 61 is stable and of doubtful significance. This may represent a tiny region of scar. The lungs are otherwise normal. Musculoskeletal: Normal. CT ABDOMEN PELVIS FINDINGS Hepatobiliary: Normal. Pancreas: Normal. Spleen: Normal. Adrenals/Urinary Tract: Normal. Stomach/Bowel: The stomach and small bowel are normal. Moderate fecal loading is seen throughout the colon. The colon is otherwise normal. The cecum is seen in the right side of the pelvis. Visualized portions of the appendix are normal. Vascular/Lymphatic: No aneurysm or dissection.  No adenopathy. Reproductive: The patient is status post tubal ligation. The uterus and adnexae are normal. Other: No other abnormalities. Musculoskeletal: No acute bony abnormalities. IMPRESSION: 1. No evidence of lymphoma recurrence in the chest, abdomen, or pelvis. 2. Moderate fecal loading throughout the colon. 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 Recent PET scan was negative for disease. Recent onset of difficulty walking and speech issues. 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 EXAM: MRI HEAD WITHOUT AND WITH  CONTRAST TECHNIQUE: Multiplanar, multiecho pulse sequences of the brain and surrounding structures were obtained without and with intravenous contrast. CONTRAST:  76mL MULTIHANCE GADOBENATE DIMEGLUMINE 529 MG/ML IV SOLN COMPARISON:  12/20/2015. FINDINGS: No acute stroke or hemorrhage.  No mass lesion or extra-axial fluid. Global atrophy, hydrocephalus ex vacuo. Significant premature brain substance loss affecting the cerebral hemispheres and cerebellum. Premature for age subcortical greater than periventricular white matter signal abnormality, also involving the pons. No definite involvement of the cerebellar white matter. There is moderate interval progression of the T2 and FLAIR hyperintensity in the pons, and to a lesser degree, progression in the subcortical white matter, compared with the study 1 month earlier. These show no restriction, susceptibility, or postcontrast enhancement. Partial empty sella. Flow voids are maintained. Extracranial soft tissues are unremarkable. Post infusion imaging through the entire head demonstrates no abnormal enhancement of brain or meninges. IMPRESSION: Interval progression of white matter signal abnormality, greatest in the pons, compared to the study 1 month earlier. These white matter lesions show no restriction, susceptibility, or postcontrast enhancement. 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 Global atrophy affecting the cerebral hemispheres, brainstem, and cerebellum, stable from priors. No areas of restricted diffusion or postcontrast  enhancement. Electronically Signed   By: Staci Righter M.D.   On: 01/14/2016 18:37   Mr Jeri Cos NW Contrast  Result Date: 12/20/2015 CLINICAL DATA:  Blurred vision with difficulty walking. History of non-Hodgkin's lymphoma and chemotherapy. EXAM: MRI HEAD WITHOUT AND WITH CONTRAST TECHNIQUE: Multiplanar, multiecho pulse sequences of the brain and surrounding structures were obtained  without and with intravenous contrast. CONTRAST:  28m MULTIHANCE GADOBENATE DIMEGLUMINE 529 MG/ML IV SOLN COMPARISON:  None. FINDINGS: Mild atrophy.  Mild ventricular enlargement consistent with atrophy. Negative for acute infarct. Scattered small hyperintensities in the deep white matter bilaterally and in the pons. This may be related to prior chemotherapy or chronic ischemia. Negative for intracranial hemorrhage.  No fluid collection Negative for mass or edema Postcontrast imaging demonstrates normal enhancement. No enhancing mass. Normal venous enhancement. Mild mucosal edema paranasal sinuses. Normal orbit. Pituitary not enlarged. IMPRESSION: No acute intracranial abnormality Generalized atrophy. Chronic appearing changes in the white matter and pons which may be related to prior chemotherapy or chronic microvascular ischemia. Electronically Signed   By: CFranchot GalloM.D.   On: 12/20/2015 15:33  Ct Abdomen Pelvis W Contrast  Result Date: 01/15/2016 CLINICAL DATA:  Recurrent lymphoma. EXAM: CT CHEST, ABDOMEN, AND PELVIS WITH CONTRAST TECHNIQUE: Multidetector CT imaging of the chest, abdomen and pelvis was performed following the standard protocol during bolus administration of intravenous contrast. CONTRAST:  1038mISOVUE-300 IOPAMIDOL (ISOVUE-300) INJECTION 61% COMPARISON:  None. FINDINGS: CT CHEST FINDINGS Cardiovascular: The heart and great vessels are normal. Mediastinum/Nodes: Normal.  No adenopathy. Lungs/Pleura: Minimal linear opacity anteriorly in the right lung on series 3, image 61 is stable and of doubtful significance. This may represent a tiny region of scar. The lungs are otherwise normal. Musculoskeletal: Normal. CT ABDOMEN PELVIS FINDINGS Hepatobiliary: Normal. Pancreas: Normal. Spleen: Normal. Adrenals/Urinary Tract: Normal. Stomach/Bowel: The stomach and small bowel are normal. Moderate fecal loading is seen throughout the colon. The colon is otherwise normal. The cecum is seen in the  right side of the pelvis. Visualized portions of the appendix are normal. Vascular/Lymphatic: No aneurysm or dissection.  No adenopathy. Reproductive: The patient is status post tubal ligation. The uterus and adnexae are normal. Other: No other abnormalities. Musculoskeletal: No acute bony abnormalities. IMPRESSION: 1. No evidence of lymphoma recurrence in the chest, abdomen, or pelvis. 2. Moderate fecal loading throughout the colon. Electronically Signed   By: DaDorise BullionII M.D   On: 01/15/2016 18:30   Dg Hip Unilat With Pelvis 2-3 Views Left  Result Date: 01/13/2016 CLINICAL DATA:  [age]/o  F; status post fall with anterior hip pain. EXAM: DG HIP (WITH OR WITHOUT PELVIS) 2-3V LEFT COMPARISON:  None. FINDINGS: There is no evidence of hip fracture or dislocation. There is no evidence of arthropathy or other focal bone abnormality. Clips project over the pelvis. IMPRESSION: No acute fracture or dislocation is identified. Electronically Signed   By: LaKristine Garbe.D.   On: 01/13/2016 02:07    Labs:  CBC:  Recent Labs  11/24/15 1327 12/20/15 1337 01/13/16 0015 01/13/16 0503  WBC 5.4 7.5 5.6 5.6  HGB 12.9 13.1 12.7 12.1  HCT 38.8 40.0 38.5 35.5*  PLT 283 291 285 261    COAGS: No results for input(s): INR, APTT in the last 8760 hours.  BMP:  Recent Labs  08/26/15 1457  11/24/15 1327 12/20/15 1337 01/13/16 0015 01/13/16 0503  NA 142  < > 139 138 136 139  K 4.1  < > 3.4* 4.0 3.7 4.0  CL 106  --   --  104 103 106  CO2 23  < > '25 27 25 28  '$ GLUCOSE 100*  < > 105 106* 110* 105*  BUN 11  < > 10.'6 18 10 8  '$ CALCIUM 9.4  < > 9.3 9.3 9.3 9.4  CREATININE 0.57  < > 0.7 0.62 0.56 0.53  GFRNONAA 115  --   --  >60 >60 >60  GFRAA 132  --   --  >60 >60 >60  < > = values in this interval not displayed.  LIVER FUNCTION TESTS:  Recent Labs  11/24/15 1327 12/20/15 1337 01/13/16 0015 01/13/16 0503 01/13/16 1217  BILITOT 0.32 0.6 0.5 0.7  --   AST '15 21 21 23  '$ --   ALT 9  12* 17 16  --   ALKPHOS 112 101 88 82  --   PROT 7.0 7.0 6.8 6.6  --   ALBUMIN 4.2 4.6 4.3 3.9 4.2    TUMOR MARKERS: No results for input(s): AFPTM, CEA, CA199, CHROMGRNA in the last 8760 hours.  Assessment and Plan:  Hx Cutaneous B cell Lymphoma New onset gait instability; AMS Autoimmune cerebellar degeneration Scheduled for Bone Marrow biopsy in Rad 8/22 per Dr Marin Olp Risks and Benefits discussed with the patient's mother including, but not limited to bleeding, infection, damage to adjacent structures or low yield requiring additional tests. All of the patient's mother questions were answered, she is agreeable to proceed. Consent signed and in chart.  Thank you for this interesting consult.  I greatly enjoyed meeting [name] and look forward to participating in their care.  A copy of this report was sent to the requesting provider on this date.  Electronically Signed: Monia Sabal A 01/17/2016, 9:16 AM   I spent a total of 20 Minutes    in face to face in clinical consultation, greater than 50% of which was counseling/coordinating care for Bone marrow bx

## 2016-01-18 ENCOUNTER — Inpatient Hospital Stay (HOSPITAL_COMMUNITY): Payer: BLUE CROSS/BLUE SHIELD

## 2016-01-18 LAB — CBC WITH DIFFERENTIAL/PLATELET
Basophils Absolute: 0 10*3/uL (ref 0.0–0.1)
Basophils Relative: 0 %
Eosinophils Absolute: 0 10*3/uL (ref 0.0–0.7)
Eosinophils Relative: 0 %
HEMATOCRIT: 35.9 % — AB (ref 36.0–46.0)
HEMOGLOBIN: 11.8 g/dL — AB (ref 12.0–15.0)
LYMPHS ABS: 0.6 10*3/uL — AB (ref 0.7–4.0)
Lymphocytes Relative: 8 %
MCH: 30.1 pg (ref 26.0–34.0)
MCHC: 32.9 g/dL (ref 30.0–36.0)
MCV: 91.6 fL (ref 78.0–100.0)
MONO ABS: 0.6 10*3/uL (ref 0.1–1.0)
MONOS PCT: 9 %
NEUTROS ABS: 5.6 10*3/uL (ref 1.7–7.7)
NEUTROS PCT: 83 %
Platelets: 251 10*3/uL (ref 150–400)
RBC: 3.92 MIL/uL (ref 3.87–5.11)
RDW: 13.3 % (ref 11.5–15.5)
WBC: 6.7 10*3/uL (ref 4.0–10.5)

## 2016-01-18 LAB — GLUCOSE, CAPILLARY
GLUCOSE-CAPILLARY: 141 mg/dL — AB (ref 65–99)
Glucose-Capillary: 96 mg/dL (ref 65–99)

## 2016-01-18 LAB — AMMONIA: AMMONIA: 11 umol/L (ref 9–35)

## 2016-01-18 LAB — PROTIME-INR
INR: 1.09
Prothrombin Time: 14.2 seconds (ref 11.4–15.2)

## 2016-01-18 LAB — BONE MARROW EXAM

## 2016-01-18 LAB — VITAMIN B1: VITAMIN B1 (THIAMINE): 140 nmol/L (ref 66.5–200.0)

## 2016-01-18 MED ORDER — MIDAZOLAM HCL 2 MG/2ML IJ SOLN
INTRAMUSCULAR | Status: AC
  Filled 2016-01-18: qty 6

## 2016-01-18 MED ORDER — LIDOCAINE HCL 1 % IJ SOLN
INTRAMUSCULAR | Status: AC
  Filled 2016-01-18: qty 20

## 2016-01-18 MED ORDER — FENTANYL CITRATE (PF) 100 MCG/2ML IJ SOLN
INTRAMUSCULAR | Status: AC
  Filled 2016-01-18: qty 4

## 2016-01-18 MED ORDER — MIDAZOLAM HCL 2 MG/2ML IJ SOLN
INTRAMUSCULAR | Status: AC | PRN
  Administered 2016-01-18 (×2): 1 mg via INTRAVENOUS

## 2016-01-18 MED ORDER — LORAZEPAM 2 MG/ML IJ SOLN
2.0000 mg | Freq: Once | INTRAMUSCULAR | Status: AC
  Administered 2016-01-18: 2 mg via INTRAVENOUS
  Filled 2016-01-18: qty 1

## 2016-01-18 MED ORDER — DEXTROSE-NACL 5-0.9 % IV SOLN
INTRAVENOUS | Status: DC
  Administered 2016-01-18 – 2016-01-19 (×2): via INTRAVENOUS
  Administered 2016-01-20: 75 mL/h via INTRAVENOUS
  Administered 2016-01-20: 01:00:00 via INTRAVENOUS
  Administered 2016-01-21: 75 mL/h via INTRAVENOUS
  Administered 2016-01-21: 1000 mL via INTRAVENOUS
  Administered 2016-01-22 – 2016-01-24 (×4): via INTRAVENOUS

## 2016-01-18 MED ORDER — FENTANYL CITRATE (PF) 100 MCG/2ML IJ SOLN
INTRAMUSCULAR | Status: AC | PRN
  Administered 2016-01-18 (×2): 25 ug via INTRAVENOUS
  Administered 2016-01-18: 50 ug via INTRAVENOUS

## 2016-01-18 NOTE — Sedation Documentation (Signed)
MD speaking with family

## 2016-01-18 NOTE — Sedation Documentation (Signed)
Patient is resting comfortably. 

## 2016-01-18 NOTE — Progress Notes (Addendum)
Patient ID: [name], female   DOB: August 20, 1970, 45 y.o.   MRN: [id number]                                                                PROGRESS NOTE                                                                                                                                                                                                             Patient Demographics:    [name], is a 45 y.o. female, DOB - March 06, 1971, HUT:[id number]  Admit date - 01/12/2016   Admitting Physician Dorothea Ogle, MD  Outpatient Primary MD for the patient is Jeoffrey Massed, MD  LOS - 5  Outpatient Specialists: Kenyon Ana (neurologist)  Chief Complaint  Patient presents with  . Weakness       Brief Narrative  45 y.o. female, w difficulty with walking as well as generalized weakness x 4 weeks pt has been having increase in falls. Pt has more weakness in her lower ext than upper ext.  Pt denies neck pain but has pain in the thoracic spine, denies lower back pain.   Pt was told by Dr. Marlis Edelson office to go to ER  In ED, pt had CT brain=> no acute process.  Left hip pain,  Xray => negative for fracture.  Neurology consulted.   Pt transferred to [facility].  MRI brain 8/18 => Interval progression of white matter signal abnormality, greatest in the pons, compared to the study 1 month earlier. These white matter lesions show no restriction, susceptibility, or postcontrast enhancement. 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     Subjective:    [name] today still has weakness, No headache, No chest pain, No abdominal pain .   Assessment  & Plan :    Principal Problem:   Weakness Active Problems:   Ataxia   Gait abnormality   Ataxia/Gait  problems with subacute progressive cerebral dysfunction - MRI brain from 01/14/16 shows abnormal T2 signal in the pons . 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 Paraneoplastic panel still pending. 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 - Patient remains encephalopathic, will check ammonia level, will start on D5 normal saline.  H/o recurrent follicular continuous lymphoma (non-Hodgkin's) - last chemotherapy completed  05/2016 - Neurology input greatly appreciated, CT scans with no evidence of lymphoma recurrence, PET scan 2 months. Preadmission with no evidence of recurrence, LP with no evidence of malignant cells, went for bone marrow biopsy this am by IR.  Code Status : Full  Family communication: None at bedside  Disposition Plan  :  pending further workup  Consults  :  Oncology/neurology  DVT Prophylaxis  :  Heparin   Lab Results  Component Value Date   PLT 251 01/18/2016    Antibiotics  :   Anti-infectives    None        Objective:   Vitals:   01/18/16 0941 01/18/16 0946 01/18/16 1111 01/18/16 1422  BP: 113/66 (!) 100/42 (!) 94/58 113/68  Pulse: 67 89 85 (!) 102  Resp: '15 17 18   '$ Temp:   97.3 F (36.3 C) 98.6 F (37 C)  TempSrc:   Oral Oral  SpO2: 100% 98% 100% 98%  Weight:      Height:        Wt Readings from Last 3 Encounters:  01/18/16 51.2 kg (112 lb 12.8 oz)  12/30/15 48.9 kg (107 lb 12.8 oz)  12/27/15 48.8 kg (107 lb 8 oz)     Intake/Output Summary (Last 24 hours) at 01/18/16 1448 Last data filed at 01/18/16 1200  Gross per 24 hour  Intake           6472.2 ml  Output                0 ml  Net           6472.2 ml     Physical Exam  Sleepy, Appears to be more lethargic and confused today.Onnie Graham Neck,No JVD Symmetrical Chest wall movement, Good air movement bilaterally, CTAB RRR,No Gallops,Rubs or new Murmurs, No  Parasternal Heave +ve B.Sounds, Abd Soft, No tenderness, No organomegaly appriciated, No rebound - guarding or rigidity. No Cyanosis, Clubbing or edema, No new Rash or bruise  , poor coordination, dysmetria.    Data Review:    CBC  Recent Labs Lab 01/13/16 0015 01/13/16 0503 01/18/16 0704  WBC 5.6 5.6 6.7  HGB 12.7 12.1 11.8*  HCT 38.5 35.5* 35.9*  PLT 285 261 251  MCV 90.2 89.4 91.6  MCH 29.7 30.5 30.1  MCHC 33.0 34.1 32.9  RDW 13.4 13.0 13.3  LYMPHSABS 0.8  --  0.6*  MONOABS 0.6  --  0.6  EOSABS 0.2  --  0.0  BASOSABS 0.0  --  0.0    Chemistries   Recent Labs Lab 01/13/16 0015 01/13/16 0503  NA 136 139  K 3.7 4.0  CL 103 106  CO2 25 28  GLUCOSE 110* 105*  BUN 10 8  CREATININE 0.56 0.53  CALCIUM 9.3 9.4  AST 21 23  ALT 17 16  ALKPHOS 88 82  BILITOT 0.5 0.7   ------------------------------------------------------------------------------------------------------------------ No results for input(s): CHOL, HDL, LDLCALC, TRIG, CHOLHDL, LDLDIRECT in the last 72 hours.  No results found for: HGBA1C ------------------------------------------------------------------------------------------------------------------ No results for input(s): TSH, T4TOTAL, T3FREE, THYROIDAB in the last 72 hours.  Invalid input(s): FREET3 ------------------------------------------------------------------------------------------------------------------ No results for input(s): VITAMINB12, FOLATE, FERRITIN, TIBC, IRON, RETICCTPCT  in the last 72 hours.  Coagulation profile  Recent Labs Lab 01/18/16 0704  INR 1.09    No results for input(s): DDIMER in the last 72 hours.  Cardiac Enzymes No results for input(s): CKMB, TROPONINI, MYOGLOBIN in the last 168 hours.  Invalid input(s): CK ------------------------------------------------------------------------------------------------------------------ No results found for: BNP  Inpatient Medications  Scheduled Meds: . fentaNYL       . [START ON 01/19/2016] heparin subcutaneous  5,000 Units Subcutaneous Q8H  . Immune Globulin 10%  400 mg/kg Intravenous Q24 Hr x 5  . lidocaine      . methylPREDNISolone (SOLU-MEDROL) injection  1,000 mg Intravenous Daily  . midazolam      . pantoprazole  40 mg Oral Daily  . polyethylene glycol  17 g Oral Daily  . potassium chloride SA  20 mEq Oral Daily  . sodium chloride flush  3 mL Intravenous Q12H  . thiamine  100 mg Oral Daily  . Vitamin D (Ergocalciferol)  50,000 Units Oral Weekly   Continuous Infusions: . dextrose 5 % and 0.9% NaCl 75 mL/hr at 01/18/16 1027  . lactated ringers 10 mL/hr at 01/14/16 1538   PRN Meds:.acetaminophen **OR** acetaminophen, bisacodyl, LORazepam, mineral oil, traMADol  Micro Results No results found for this or any previous visit (from the past 240 hour(s)).  Radiology Reports Ct Head Wo Contrast  Result Date: 01/13/2016 CLINICAL DATA:  45 y/o F; increased weakness and difficulty with speech mobility over the last 3 weeks. History of non-Hodgkin's lymphoma. EXAM: CT HEAD WITHOUT CONTRAST TECHNIQUE: Contiguous axial images were obtained from the base of the skull through the vertex without intravenous contrast. COMPARISON:  MRI of the brain dated 12/20/2015. FINDINGS: Brain: No evidence of acute infarction, hemorrhage, hydrocephalus, extra-axial collection or mass lesion/mass effect. Moderate generalized atrophy for patient's age. Few lucencies in white matter and within the pons corresponding FLAIR signal abnormality on prior MRI and are nonspecific. Vascular: No hyperdense vessel or unexpected calcification. Skull: No fracture is identified.  No suspicious lesion. Sinuses/Orbits: No acute finding. Other: No unexpected finding. IMPRESSION: No acute intracranial abnormality is identified. Stable generalized volume loss and mild nonspecific white matter lucencies possibly representing microvascular disease or posttreatment changes. Electronically Signed   By:  Kristine Garbe M.D.   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 Staging. Subsequent encounter. EXAM: CT NECK WITH CONTRAST TECHNIQUE: Multidetector CT imaging of the neck was performed using the standard protocol following the bolus administration of intravenous contrast. CONTRAST:  175m ISOVUE-300 IOPAMIDOL (ISOVUE-300) INJECTION 61% in conjunction with contrast enhanced imaging of the chest, abdomen, and pelvis reported separately. COMPARISON:  Brain MRI 01/14/2016. Head CT without contrast 01/13/2016. PET-CT 11/03/2015. FINDINGS: Pharynx and larynx: Larynx is normal in best seen on series 6, image 70. Pharyngeal soft tissue contours are within normal limits. Negative parapharyngeal and retropharyngeal spaces. Salivary glands: Negative sublingual space, sublingual glands, submandibular glands, and parotid glands. Thyroid: Negative. Lymph nodes: No cervical lymphadenopathy. Only diminutive (4 mm short axis or smaller) bilateral lymph nodes are identified. Vascular: Major vascular structures in the neck and at skullbase are patent and appear normal. Limited intracranial: Stable from yesterday brain MRI. Visualized orbits: Negative. Mastoids and visualized paranasal sinuses: Visualized paranasal sinuses and mastoids are stable and well pneumatized. Skeleton: No osseous abnormality identified. Upper chest: Chest CT is reported separately today. IMPRESSION: 1. Negative neck CT.  No evidence of lymphoma in  the neck. 2. CT chest abdomen and pelvis today reported separately. Electronically Signed   By: Genevie Ann M.D.   On: 01/15/2016 18:23   Ct Chest W Contrast  Result Date: 01/15/2016 CLINICAL DATA:  Recurrent lymphoma. EXAM: CT CHEST, ABDOMEN, AND PELVIS WITH CONTRAST TECHNIQUE: Multidetector CT imaging of the chest, abdomen and pelvis was performed following the standard protocol  during bolus administration of intravenous contrast. CONTRAST:  120m ISOVUE-300 IOPAMIDOL (ISOVUE-300) INJECTION 61% COMPARISON:  None. FINDINGS: CT CHEST FINDINGS Cardiovascular: The heart and great vessels are normal. Mediastinum/Nodes: Normal.  No adenopathy. Lungs/Pleura: Minimal linear opacity anteriorly in the right lung on series 3, image 61 is stable and of doubtful significance. This may represent a tiny region of scar. The lungs are otherwise normal. Musculoskeletal: Normal. CT ABDOMEN PELVIS FINDINGS Hepatobiliary: Normal. Pancreas: Normal. Spleen: Normal. Adrenals/Urinary Tract: Normal. Stomach/Bowel: The stomach and small bowel are normal. Moderate fecal loading is seen throughout the colon. The colon is otherwise normal. The cecum is seen in the right side of the pelvis. Visualized portions of the appendix are normal. Vascular/Lymphatic: No aneurysm or dissection.  No adenopathy. Reproductive: The patient is status post tubal ligation. The uterus and adnexae are normal. Other: No other abnormalities. Musculoskeletal: No acute bony abnormalities. IMPRESSION: 1. No evidence of lymphoma recurrence in the chest, abdomen, or pelvis. 2. Moderate fecal loading throughout the colon. 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 Recent PET scan was negative for disease. Recent onset of difficulty walking and speech issues. 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 EXAM: MRI HEAD WITHOUT AND WITH CONTRAST TECHNIQUE: Multiplanar, multiecho pulse sequences of the brain and surrounding structures were obtained without and with intravenous contrast. CONTRAST:  154mMULTIHANCE GADOBENATE DIMEGLUMINE 529 MG/ML IV SOLN COMPARISON:  12/20/2015. FINDINGS: No acute stroke or hemorrhage.  No mass lesion or extra-axial fluid.  Global atrophy, hydrocephalus ex vacuo. Significant premature brain substance loss affecting the cerebral hemispheres and cerebellum. Premature for age subcortical greater than periventricular white matter signal abnormality, also involving the pons. No definite involvement of the cerebellar white matter. There is moderate interval progression of the T2 and FLAIR hyperintensity in the pons, and to a lesser degree, progression in the subcortical white matter, compared with the study 1 month earlier. These show no restriction, susceptibility, or postcontrast enhancement. Partial empty sella. Flow voids are maintained. Extracranial soft tissues are unremarkable. Post infusion imaging through the entire head demonstrates no abnormal enhancement of brain or meninges. IMPRESSION: Interval progression of white matter signal abnormality, greatest in the pons, compared to the study 1 month earlier. These white matter lesions show no restriction, susceptibility, or postcontrast enhancement. 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 Global atrophy affecting the cerebral hemispheres, brainstem, and cerebellum, stable from priors. No areas of restricted diffusion or postcontrast enhancement. Electronically Signed   By: JoStaci Righter.D.   On: 01/14/2016 18:37   Mr BrJeri CosoYTontrast  Result Date: 12/20/2015 CLINICAL DATA:  Blurred vision with difficulty walking. History of non-Hodgkin's lymphoma and chemotherapy. EXAM: MRI HEAD WITHOUT AND WITH CONTRAST TECHNIQUE: Multiplanar, multiecho pulse sequences of the brain and surrounding structures were obtained without and with intravenous contrast. CONTRAST:  1037mULTIHANCE GADOBENATE DIMEGLUMINE 529 MG/ML IV SOLN COMPARISON:  None. FINDINGS: Mild atrophy.  Mild ventricular enlargement consistent with atrophy.  Negative for acute infarct. Scattered small hyperintensities in the deep white matter bilaterally and in the pons. This  may be related to prior chemotherapy or chronic ischemia. Negative for intracranial hemorrhage.  No fluid collection Negative for mass or edema Postcontrast imaging demonstrates normal enhancement. No enhancing mass. Normal venous enhancement. Mild mucosal edema paranasal sinuses. Normal orbit. Pituitary not enlarged. IMPRESSION: No acute intracranial abnormality Generalized atrophy. Chronic appearing changes in the white matter and pons which may be related to prior chemotherapy or chronic microvascular ischemia. Electronically Signed   By: Marlan Palau M.D.   On: 12/20/2015 15:33  Ct Abdomen Pelvis W Contrast  Result Date: 01/15/2016 CLINICAL DATA:  Recurrent lymphoma. EXAM: CT CHEST, ABDOMEN, AND PELVIS WITH CONTRAST TECHNIQUE: Multidetector CT imaging of the chest, abdomen and pelvis was performed following the standard protocol during bolus administration of intravenous contrast. CONTRAST:  ISOVUE-300 IOPAMIDOL (ISOVUE-300) INJECTION 61% COMPARISON:  None. FINDINGS: CT CHEST FINDINGS Cardiovascular: The heart and great vessels are normal. Mediastinum/Nodes: Normal.  No adenopathy. Lungs/Pleura: Minimal linear opacity anteriorly in the right lung on series 3, image 61 is stable and of doubtful significance. This may represent a tiny region of scar. The lungs are otherwise normal. Musculoskeletal: Normal. CT ABDOMEN PELVIS FINDINGS Hepatobiliary: Normal. Pancreas: Normal. Spleen: Normal. Adrenals/Urinary Tract: Normal. Stomach/Bowel: The stomach and small bowel are normal. Moderate fecal loading is seen throughout the colon. The colon is otherwise normal. The cecum is seen in the right side of the pelvis. Visualized portions of the appendix are normal. Vascular/Lymphatic: No aneurysm or dissection.  No adenopathy. Reproductive: The patient is status post tubal ligation. The uterus and adnexae are normal. Other: No other abnormalities. Musculoskeletal: No acute bony abnormalities. IMPRESSION: 1. No  evidence of lymphoma recurrence in the chest, abdomen, or pelvis. 2. Moderate fecal loading throughout the colon. Electronically Signed   By: Gerome Sam III M.D   On: 01/15/2016 18:30   Ct Biopsy  Result Date: 01/18/2016 CLINICAL DATA:  History of cutaneous lymphoma. Bone marrow biopsy requested to assess for any evidence of lymphoma recurrence. EXAM: CT GUIDED BONE MARROW ASPIRATION AND CORE BIOPSY ANESTHESIA/SEDATION: Versed 2.0 mg IV, Fentanyl 100 mcg IV Total Moderate Sedation Time 20 minutes. The patient's level of consciousness and physiologic status were continuously monitored during the procedure by Radiology nursing. PROCEDURE: The procedure risks, benefits, and alternatives were explained to the patient's mother. Questions regarding the procedure were encouraged and answered. The patient's mother understands and consents to the procedure. A time-out was performed prior to the procedure. The right gluteal region was prepped with Betadine. Sterile gown and sterile gloves were used for the procedure. Local anesthesia was provided with 1% Lidocaine. Under CT guidance, an 11 gauge OnControl bone cutting needle was advanced from a posterior approach into the right iliac bone. Needle positioning was confirmed with CT. Initial non heparinized and heparinized aspirate samples were obtained of bone marrow. Core biopsy was performed with the 11 gauge needle. COMPLICATIONS: None FINDINGS: Inspection of initial aspirate did reveal visible particles. Intact core biopsy sample was obtained. IMPRESSION: CT guided bone marrow biopsy of right posterior iliac bone with both aspirate and core samples obtained. Electronically Signed   By: Irish Lack M.D.   On: 01/18/2016 11:07   Dg Hip Unilat With Pelvis 2-3 Views Left  Result Date: 01/13/2016 CLINICAL DATA:  45 y/o  F; status post fall with anterior hip pain. EXAM: DG HIP (WITH OR WITHOUT PELVIS) 2-3V LEFT COMPARISON:  None. FINDINGS: There  is no evidence of  hip fracture or dislocation. There is no evidence of arthropathy or other focal bone abnormality. Clips project over the pelvis. IMPRESSION: No acute fracture or dislocation is identified. Electronically Signed   By: Kristine Garbe M.D.   On: 01/13/2016 02:07      Mishka Stegemann M.D on 01/18/2016 at 2:48 PM  Between 7am to 7pm - Pager - [phone]  After 7pm go to [email or web address] - password [facility]  [facility] -  Office  [phone]

## 2016-01-18 NOTE — Evaluation (Signed)
Physical Therapy Evaluation Patient Details Name: [name] MRN: [id number] DOB: [birth year] Today's Date: 01/18/2016   History of Present Illness  [name] is a 45 yo female who was admitted with generalized weakness, difficulty walking, and thoracic pain.  PMH includes non-hodgkin's lymphoma, HSV-2, cerebellar ataxia, anxiety and depression.  Clinical Impression  Pt was admitted with the above and presents with deficits listed below. Pt is a poor historian due to lethargy and cognitive deficits at this time. Required max encouragement to participate in therapy. Pt reports she lives at home with her two teenage children, but provides conflicting information about PLOF and assistance available upon d/c. Address with pt when more alert. PT recommends SNF to address deficits of strength, balance, and gait training to achieve mod I to return home safely. Acute rehab to follow.     Follow Up Recommendations SNF;Supervision/Assistance - 24 hour    Equipment Recommendations  None recommended by PT    Recommendations for Other Services       Precautions / Restrictions Precautions Precautions: Fall Restrictions Weight Bearing Restrictions: No      Mobility  Bed Mobility Overal bed mobility: Needs Assistance Bed Mobility: Supine to Sit     Supine to sit: Min assist     General bed mobility comments: tactile cues to promote sidelying to sit, minA for trunk elevation, max encouragement to participate. pt did tranfer to EOB with min guard and donned socks and then impulsively returned to sidelying prior to second attempt  Transfers Overall transfer level: Needs assistance Equipment used: Rolling walker (2 wheeled) Transfers: Sit to/from Stand Sit to Stand: Mod assist         General transfer comment: Pt required mod A to power up and gain balance with Rw once standing .  Ambulation/Gait Ambulation/Gait assistance: Mod assist Ambulation Distance (Feet): 50  Feet Assistive device: Rolling walker (2 wheeled) Gait Pattern/deviations: Step-through pattern;Decreased stride length;Ataxic;Narrow base of support Gait velocity: slow Gait velocity interpretation: Below normal speed for age/gender General Gait Details: Pt required max encouragement to ambulate. Pt required mod A for support and walker management due to decreased balance and ataxia. Chair follow for safety. pt tremulous. pt tearfull at end. minimal eye opening  Stairs            Wheelchair Mobility    Modified Rankin (Stroke Patients Only)       Balance Overall balance assessment: Needs assistance Sitting-balance support: Feet unsupported;No upper extremity supported Sitting balance-Leahy Scale: Fair Sitting balance - Comments: Pt able to don socks with ataxic extremities while long sitting in bed     Standing balance-Leahy Scale: Poor Standing balance comment: Pt reliant on support from walker and PT for balance                             Pertinent Vitals/Pain Pain Assessment: Faces Faces Pain Scale: Hurts even more Pain Descriptors / Indicators: Moaning Pain Intervention(s): Premedicated before session    Home Living Family/patient expects to be discharged to:: Private residence Living Arrangements: Children Available Help at Discharge: Other (Comment) (Unsure due to pt's cognition ) Type of Home: House       Home Layout: One level Home Equipment: Walker - 2 wheels Additional Comments: Pt lives at home with her two children, 22 and 74.    Prior Function Level of Independence: Independent with assistive device(s)         Comments: Pt states she  used RW for ambulation and initially reported she was independent with ADLs. However when asked for additional details pt provided conflicting information and stated "I don't know".     Hand Dominance   Dominant Hand: Right    Extremity/Trunk Assessment   Upper Extremity Assessment: Generalized  weakness;RUE deficits/detail;LUE deficits/detail       LUE Deficits / Details: generalized weakness   Lower Extremity Assessment: Generalized weakness;RLE deficits/detail;LLE deficits/detail      Cervical / Trunk Assessment: Kyphotic  Communication   Communication: Expressive difficulties  Cognition Arousal/Alertness: Lethargic;Suspect due to medications Behavior During Therapy: Flat affect Overall Cognitive Status: No family/caregiver present to determine baseline cognitive functioning Area of Impairment: Orientation;Attention;Following commands;Safety/judgement;Awareness;Problem solving Orientation Level: Disoriented to;Place;Time;Situation Current Attention Level: Focused   Following Commands: Follows one step commands consistently;Follows one step commands with increased time Safety/Judgement: Decreased awareness of safety Awareness: Intellectual Problem Solving: Slow processing;Decreased initiation;Requires verbal cues General Comments: Pt very lethargic and repeatedly stated "I don't know" and "I don't know what's going on"    General Comments General comments (skin integrity, edema, etc.): Pt is poor historian, lethargic and confused. Pt with occassional brief periods of crying stating "I don't know what's gonig on". Required heavy encouragement to participate in therapy.    Exercises        Assessment/Plan    PT Assessment Patient needs continued PT services  PT Diagnosis Difficulty walking;Abnormality of gait;Generalized weakness   PT Problem List Decreased strength;Decreased range of motion;Decreased activity tolerance;Decreased balance;Decreased mobility;Decreased coordination;Decreased cognition;Decreased knowledge of use of DME;Decreased safety awareness  PT Treatment Interventions DME instruction;Gait training;Stair training;Functional mobility training;Therapeutic activities;Therapeutic exercise;Balance training;Neuromuscular re-education;Cognitive  remediation;Patient/family education   PT Goals (Current goals can be found in the Care Plan section) Acute Rehab PT Goals Patient Stated Goal: none stated PT Goal Formulation: Patient unable to participate in goal setting Time For Goal Achievement: 01/25/16 Potential to Achieve Goals: Fair    Frequency Min 3X/week   Barriers to discharge        Co-evaluation               End of Session Equipment Utilized During Treatment: Gait belt Activity Tolerance: Patient limited by lethargy Patient left: in chair;with chair alarm set;with call bell/phone within reach;with nursing/sitter in room Nurse Communication: Mobility status         Time: [id number] PT Time Calculation (min) (ACUTE ONLY): 29 min   Charges:   PT Evaluation $PT Eval Moderate Complexity: 1 Procedure PT Treatments $Gait Training: 8-22 mins   PT G CodesDaymon Larsen 02-16-16, 3:30 PM  Tawni Millers, SPT (student physical therapist) [place] [phone]

## 2016-01-18 NOTE — Procedures (Signed)
Interventional Radiology Procedure Note  Procedure: CT guided aspirate and core biopsy of right iliac bone Complications: None Recommendations: - Bedrest supine x 1 hrs - Follow biopsy results  Taite Baldassari T. Fatimah Sundquist, M.D Pager:  [phone]   

## 2016-01-18 NOTE — Sedation Documentation (Signed)
Bandaid intact low back

## 2016-01-18 NOTE — Progress Notes (Signed)
OT Cancellation Note  Patient Details Name: [name] MRN: [id number] DOB: 12-Sep-1970   Cancelled Treatment:    Reason Eval/Treat Not Completed: Fatigue/lethargy limiting ability to participate;Patient declined. Upon entering the room, pt seated supine in bed and falling asleep as OT spoke to her. Pt stating she did not feel well and declined participation at this time. OT will attempt evaluation at next available time.  Phineas Semen 01/18/2016, 3:29 PM

## 2016-01-18 NOTE — Progress Notes (Signed)
Blood sugar at 96 at this time.

## 2016-01-19 ENCOUNTER — Ambulatory Visit: Payer: BLUE CROSS/BLUE SHIELD | Admitting: Hematology & Oncology

## 2016-01-19 ENCOUNTER — Other Ambulatory Visit: Payer: BLUE CROSS/BLUE SHIELD

## 2016-01-19 ENCOUNTER — Encounter (HOSPITAL_COMMUNITY): Payer: Self-pay | Admitting: Radiology

## 2016-01-19 LAB — BASIC METABOLIC PANEL
ANION GAP: 6 (ref 5–15)
BUN: 12 mg/dL (ref 6–20)
CALCIUM: 9.4 mg/dL (ref 8.9–10.3)
CHLORIDE: 102 mmol/L (ref 101–111)
CO2: 25 mmol/L (ref 22–32)
Creatinine, Ser: 0.66 mg/dL (ref 0.44–1.00)
GFR calc non Af Amer: >60 mL/min (ref >60)
GLUCOSE: 108 mg/dL — AB (ref 65–99)
Potassium: 3.2 mmol/L — ABNORMAL LOW (ref 3.5–5.1)
Sodium: 133 mmol/L — ABNORMAL LOW (ref 135–145)

## 2016-01-19 LAB — GLUCOSE, CAPILLARY
GLUCOSE-CAPILLARY: 128 mg/dL — AB (ref 65–99)
GLUCOSE-CAPILLARY: 157 mg/dL — AB (ref 65–99)
GLUCOSE-CAPILLARY: 145 mg/dL — AB (ref 65–99)
Glucose-Capillary: 102 mg/dL — ABNORMAL HIGH (ref 65–99)

## 2016-01-19 MED ORDER — POTASSIUM CHLORIDE CRYS ER 20 MEQ PO TBCR
40.0000 meq | EXTENDED_RELEASE_TABLET | Freq: Once | ORAL | Status: DC
  Filled 2016-01-19 (×2): qty 2

## 2016-01-19 NOTE — Evaluation (Signed)
Occupational Therapy Evaluation Patient Details Name: [name] MRN: [id number] DOB: [birth year] Today's Date: 01/19/2016    History of Present Illness [name] is a 45 yo female who was admitted with generalized weakness, difficulty walking, and thoracic pain.  PMH includes non-hodgkin's lymphoma, HSV-2, cerebellar ataxia, anxiety and depression.   Clinical Impression   PT admitted with see above. Pt currently with functional limitiations due to the deficits listed below (see OT problem list). PTA was having progressive memory loss and progressive generalized weakness.  Pt will benefit from skilled OT to increase their independence and safety with adls and balance to allow discharge SNF. Pt currently unable to complete self feeding without (A) and cognitive deficits. Pt will require high level care at this time.       Follow Up Recommendations  SNF;Supervision/Assistance - 24 hour    Equipment Recommendations  Other (comment) (defer to SNF)    Recommendations for Other Services       Precautions / Restrictions Precautions Precautions: Fall      Mobility Bed Mobility               General bed mobility comments: on eob on arrival  Transfers Overall transfer level: Needs assistance   Transfers: Sit to/from Stand;Stand Pivot Transfers Sit to Stand: Mod assist Stand pivot transfers: +2 physical assistance;Min assist       General transfer comment: pt with ataxic bil Le movement. tp needed cues to sequence task    Balance     Sitting balance-Leahy Scale: Fair       Standing balance-Leahy Scale: Zero                              ADL Overall ADL's : Needs assistance/impaired Eating/Feeding: Minimal assistance;Sitting Eating/Feeding Details (indicate cue type and reason): pt requires (A) to initiate self feeding and pt states "i dont want to eat " when presented with food. when food placed next to lips patient began to eat and  continued task.  Grooming: Wash/dry hands;Wash/dry face;Oral care;Applying deodorant;Maximal assistance;Sitting Grooming Details (indicate cue type and reason): pt needed (A) to hold adl items initiate task and sustain attention to task. pt with x2 techs entering room during task and unable sustain attention to task Upper Body Bathing: Maximal assistance   Lower Body Bathing: Maximal assistance           Toilet Transfer: Maximal assistance;Stand-pivot             General ADL Comments: pt sitting eob with tech on arrival for bathing. Pt      Vision Additional Comments: static stare at all times during session. pt able to locate utensils on breakfast tray   Perception     Praxis      Pertinent Vitals/Pain Pain Assessment: No/denies pain     Hand Dominance Right   Extremity/Trunk Assessment Upper Extremity Assessment Upper Extremity Assessment: Generalized weakness;RUE deficits/detail;LUE deficits/detail RUE Deficits / Details: decr fine motor, tremor noted, ataxic movement ,  LUE Deficits / Details: decr fine motor, tremor noted, ataxic movement ,    Lower Extremity Assessment Lower Extremity Assessment: Defer to PT evaluation   Cervical / Trunk Assessment Cervical / Trunk Assessment: Kyphotic   Communication Communication Communication: Expressive difficulties   Cognition Arousal/Alertness: Awake/alert Behavior During Therapy: Flat affect Overall Cognitive Status: Impaired/Different from baseline Area of Impairment: Safety/judgement;Awareness         Safety/Judgement: Decreased awareness of deficits;Decreased  awareness of safety Awareness: Emergent   General Comments: pt able to verbalize visitor is mother and children names correctly. pt crying upon mothers arrival and states "i am mad at you"    General Comments       Exercises       Shoulder Instructions      Home Living Family/patient expects to be discharged to:: Skilled nursing  facility Living Arrangements: Children                               Additional Comments: mother very involved and helping patient however mother also has to (A) with children 43 yo and 4 yo       Prior Functioning/Environment Level of Independence: Independent with assistive device(s)        Comments: per PT evaluation patient used RW    OT Diagnosis: Generalized weakness;Cognitive deficits   OT Problem List: Decreased strength;Decreased activity tolerance;Impaired balance (sitting and/or standing);Decreased cognition;Decreased safety awareness;Decreased knowledge of use of DME or AE;Decreased knowledge of precautions;Impaired UE functional use   OT Treatment/Interventions: Self-care/ADL training;Therapeutic exercise;Neuromuscular education;DME and/or AE instruction;Therapeutic activities;Cognitive remediation/compensation;Patient/family education;Balance training    OT Goals(Current goals can be found in the care plan section) Acute Rehab OT Goals Patient Stated Goal: none stated OT Goal Formulation: With patient/family Time For Goal Achievement: 02/02/16 Potential to Achieve Goals: Good ADL Goals Pt Will Perform Eating: with supervision;sitting Pt Will Perform Grooming: with supervision;sitting Pt Will Perform Upper Body Bathing: with supervision;sitting Pt Will Transfer to Toilet: with supervision;bedside commode;stand pivot transfer  OT Frequency: Min 2X/week   Barriers to D/C:            Co-evaluation              End of Session Equipment Utilized During Treatment: Gait belt Nurse Communication: Mobility status;Precautions  Activity Tolerance: Patient tolerated treatment well Patient left: in chair;with call bell/phone within reach;with chair alarm set;with family/visitor present   Time: [id number] OT Time Calculation (min): 21 min Charges:  OT General Charges $OT Visit: 1 Procedure OT Evaluation $OT Eval Moderate Complexity: 1  Procedure G-Codes:    Parke Poisson B 2016-01-27, 11:40 AM   Jeri Modena   OTR/L PagerIP:[phone] Office: [phone] .

## 2016-01-19 NOTE — Progress Notes (Signed)
Pt constantly getting out of bed without notifying staff with call light or phone call. Moved patient to room with camera closer to nursing station there are mats on the floor and patient is being constantly reminded to call before getting out of bed. Will continue to monitor.

## 2016-01-19 NOTE — Transfer of Care (Signed)
Immediate Anesthesia Transfer of Care Note  Patient: [name]  Procedure(s) Performed: Procedure(s): RADIOLOGY WITH ANESTHESIA (N/A)  Patient Location: PACU  Anesthesia Type:General  Level of Consciousness: sedated and patient cooperative  Airway & Oxygen Therapy: Patient Spontanous Breathing  Post-op Assessment: Report given to RN  Post vital signs: Reviewed  Last Vitals:  Vitals:   01/19/16 0900 01/19/16 1343  BP: (!) 97/48 (!) 110/59  Pulse: 94 87  Resp: 18 16  Temp: 36.9 C 36.6 C    Last Pain:  Vitals:   01/19/16 1343  TempSrc: Oral  PainSc:       Patients Stated Pain Goal: 2 ([id number])  Complications: No apparent anesthesia complications

## 2016-01-19 NOTE — Clinical Social Work Note (Signed)
Clinical Social Work Assessment  Patient Details  Name: [name] MRN: [id number] Date of Birth: [birth year]  Date of referral:  01/19/16               Reason for consult:  Facility Placement                Permission sought to share information with:  Family Supports Permission granted to share information::  Yes, Verbal Permission Granted  Name::     [name]  Relationship::  mother  Contact Information:  [phone]  Housing/Transportation Living arrangements for the past 2 months:  [place] of Information:  Parent Patient Interpreter Needed:  None Criminal Activity/Legal Involvement Pertinent to Current Situation/Hospitalization:  No - Comment as needed Significant Relationships:  Parents Lives with:  Self, Minor Children Do you feel safe going back to the place where you live?  No (Pt needs a higher level of care) Need for family participation in patient care:  Yes (Comment)  Care giving concerns:  Pt is in need of a higher level of care prior to returning home.    Social Worker assessment / plan:  CSW met with pt and mother to address consult. CSW introduced herself and explained role of social work. CSW also explained process of discharging to SNF per PT recommendations. Pt was not able to engage in assessment due to orientation, however she was alert and attempting to get more comfortable. Pt lives alone with her minor children. Pt's mother is caring for children at this time. CSW intiated SNF search and followed up with bed offers. Pt's mother chose [facility]. CSW updated facility, who in turn initiated [place]. CSW will continue to follow.   Employment status:  Therapist, music:  Managed Care PT Recommendations:  [place] / Referral to community resources:  [place]  Patient/Family's Response to care:  Pt's mother was appreciative of CSW support.   Patient/Family's Understanding of  and Emotional Response to Diagnosis, Current Treatment, and Prognosis:  Pt's mother understands that pt is in need of continued care prior to returning home.   Emotional Assessment Appearance:  Appears stated age Attitude/Demeanor/Rapport:  Other (Appropriate) Affect (typically observed):  Accepting, Adaptable Orientation:  Oriented to Self, Fluctuating Orientation (Suspected and/or reported Sundowners) Alcohol / Substance use:  Never Used Psych involvement (Current and /or in the community):  No (Comment)  Discharge Needs  Concerns to be addressed:  Adjustment to Illness Readmission within the last 30 days:  No Current discharge risk:  Chronically ill Barriers to Discharge:  Continued Medical Work up   [employer], [place] 01/19/2016, 5:11 PM

## 2016-01-19 NOTE — NC FL2 (Signed)
[place] LEVEL OF CARE SCREENING TOOL     IDENTIFICATION  Patient Name: [name] Birthdate: [birth year] Sex: female Admission Date (Current Location): 01/12/2016  [facility] and [place] Number:  Herbalist and Address:  [facility], [place] [address] [place], [place] [address]      Provider Number: [id number]  Attending Physician Name and Address:  Albertine Patricia, MD  Relative Name and Phone Number:       Current Level of Care: Hospital Recommended Level of Care: [place] Prior Approval Number:    Date Approved/Denied:   PASRR Number: [id number]  Discharge Plan: Home    Current Diagnoses: Patient Active Problem List   Diagnosis Date Noted  . Gait abnormality   . Weakness 01/13/2016  . Ataxia 01/13/2016  . Left hip pain   . Cerebellar ataxia ([place]) 12/30/2015  . 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 Adult ADHD 09/10/2013  . Tobacco dependence 09/10/2013  . Anxiety and depression 08/01/2013  . 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    Orientation RESPIRATION BLADDER Height & Weight     Self, Situation, Place  Normal Continent Weight: 112 lb 12.8 oz (51.2 kg) Height:  5\' 2"  (157.5 cm)  BEHAVIORAL SYMPTOMS/MOOD NEUROLOGICAL BOWEL NUTRITION STATUS      Continent Diet (heart healthy, thin liquids)  AMBULATORY STATUS COMMUNICATION OF NEEDS Skin   Extensive Assist Verbally Normal                       Personal Care Assistance Level of Assistance  Bathing, Dressing, Feeding Bathing Assistance: Limited assistance Feeding assistance: Independent Dressing Assistance: Limited assistance     Functional Limitations Info  Sight, Hearing, Speech Sight Info: Adequate Hearing Info: Adequate Speech Info: Adequate    SPECIAL CARE FACTORS FREQUENCY  PT (By licensed PT), OT (By licensed OT)     PT Frequency: 5 OT Frequency: 5            Contractures  Contractures Info: Not present    Additional Factors Info  Code Status, Allergies Code Status Info: Full Code Allergies Info: No known allergies           Current Medications (01/19/2016):  This is the current hospital active medication list Current Facility-Administered Medications  Medication Dose Route Frequency Provider Last Rate Last Dose  . acetaminophen (TYLENOL) tablet 650 mg  650 mg Oral Q6H PRN Jani Gravel, MD   650 mg at 01/19/16 [id number]   Or  . acetaminophen (TYLENOL) suppository 650 mg  650 mg Rectal Q6H PRN Jani Gravel, MD      . bisacodyl (DULCOLAX) suppository 10 mg  10 mg Rectal Daily PRN Jani Gravel, MD   10 mg at 01/16/16 1119  . dextrose 5 %-0.9 % sodium chloride infusion   Intravenous Continuous Albertine Patricia, MD 75 mL/hr at 01/18/16 1027    . heparin injection 5,000 Units  5,000 Units Subcutaneous Q8H Monia Sabal, PA-C   5,000 Units at 01/19/16 0439  . Immune Globulin 10% (PRIVIGEN) IV infusion 20 g  400 mg/kg Intravenous Q24 Hr x 5 Greta Doom, MD 192 mL/hr at 01/16/16 2138 20 g at 01/18/16 2152  . lactated ringers infusion   Intravenous Continuous Effie Berkshire, MD 10 mL/hr at 01/14/16 1538    . LORazepam (ATIVAN) tablet 2 mg  2 mg Oral Q6H PRN Roxan Hockey, MD   2 mg at 01/19/16 0903  .  methylPREDNISolone sodium succinate (SOLU-MEDROL) 1,000 mg in sodium chloride 0.9 % 50 mL IVPB  1,000 mg Intravenous Daily Greta Doom, MD   1,000 mg at 01/18/16 1027  . mineral oil enema 1 enema  1 enema Rectal Daily PRN Jani Gravel, MD   1 enema at 01/16/16 1159  . pantoprazole (PROTONIX) EC tablet 40 mg  40 mg Oral Daily Albertine Patricia, MD   40 mg at 01/19/16 1043  . polyethylene glycol (MIRALAX / GLYCOLAX) packet 17 g  17 g Oral Daily Hollace Kinnier [place], PA-C   17 g at 01/19/16 1043  . potassium chloride SA (K-DUR,KLOR-CON) CR tablet 20 mEq  20 mEq Oral Daily Theodis Blaze, MD   20 mEq at 01/19/16 1043  . sodium chloride flush (NS) 0.9 % injection 3 mL  3 mL  Intravenous Q12H Jani Gravel, MD   3 mL at 01/18/16 2215  . thiamine (VITAMIN B-1) tablet 100 mg  100 mg Oral Daily Roxan Hockey, MD   100 mg at 01/19/16 1043  . traMADol (ULTRAM) tablet 50 mg  50 mg Oral Q8H PRN Albertine Patricia, MD   50 mg at 01/19/16 0911  . Vitamin D (Ergocalciferol) (DRISDOL) capsule 50,000 Units  50,000 Units Oral Weekly Jani Gravel, MD         Discharge Medications: Please see discharge summary for a list of discharge medications.  Relevant Imaging Results:  Relevant Lab Results:   Additional Information SSN:  [id number]  Darden Dates, LCSW

## 2016-01-19 NOTE — Progress Notes (Signed)
Patient ID: [name], female   DOB: 1970/12/02, 45 y.o.   MRN: [id number]                                                                PROGRESS NOTE                                                                                                                                                                                                             Patient Demographics:    [name], is a 45 y.o. female, DOB - 1971/04/26, FIE:[id number]  Admit date - 01/12/2016   Admitting Physician Theodis Blaze, MD  Outpatient Primary MD for the patient is Tammi Sou, MD  LOS - 6  Outpatient Specialists: Ulyses Jarred (neurologist)  Chief Complaint  Patient presents with  . Weakness       Brief Narrative  45 y.o. female, w difficulty with walking as well as generalized weakness x 4 weeks pt has been having increase in falls. MRI brain 8/18 => Interval progression of white matter signal abnormality, greatest in the pons, compared to the study 1 month earlier. 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     Subjective:    [name] today still has weakness, No headache, No chest pain, No abdominal pain .With an episode of agitation earlier   Assessment  & Plan :    Principal Problem:   Weakness Active Problems:   Ataxia   Gait abnormality   Ataxia/Gait problems with subacute progressive cerebral dysfunction - MRI brain from 01/14/16 shows abnormal T2 signal in the pons . 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 Paraneoplastic panel still pending, spoke with labs, to send out just to [facility], earliest would be 8/26. 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 - Patient remains encephalopathic, will check ammonia level, will start on D5 normal saline.  H/o recurrent  follicular continuous lymphoma (non-Hodgkin's) - last chemotherapy completed  05/2016 - Neurology input greatly appreciated, CT scans with no evidence of lymphoma recurrence, PET scan 2 months. Preadmission with no evidence of recurrence, LP with no evidence of malignant cells , bone  marrow biopsy 8/22, results still pending.   Hypokalemia - Repleted  Code Status : Full  Family communication: None at bedside  Disposition Plan  :  pending further workup  Consults  :  Oncology/neurology  DVT Prophylaxis  :  Heparin   Lab Results  Component Value Date   PLT 251 01/18/2016    Antibiotics  :   Anti-infectives    None        Objective:   Vitals:   01/19/16 0053 01/19/16 0542 01/19/16 0900 01/19/16 1343  BP: 109/67 94/61 (!) 97/48 (!) 110/59  Pulse: 72 78 94 87  Resp: _0 Temp: 98.3 F (36.8 C) 98.7 F (37.1 C) 98.5 F (36.9 C) 97.8 F (36.6 C)  TempSrc: Oral Oral Axillary Oral  SpO2: 98% 97% 99% 98%  Weight:      Height:        Wt Readings from Last 3 Encounters:  01/18/16 51.2 kg (112 lb 12.8 oz)  12/30/15 48.9 kg (107 lb 12.8 oz)  12/27/15 48.8 kg (107 lb 8 oz)     Intake/Output Summary (Last 24 hours) at 01/19/16 1446 Last data filed at 01/18/16 2215  Gross per 24 hour  Intake           704.25 ml  Output                0 ml  Net           704.25 ml     Physical Exam  Sleepy,Remains lethargic and confused today.[name] Neck,No JVD Symmetrical Chest wall movement, Good air movement bilaterally, CTAB RRR,No Gallops,Rubs or new Murmurs, No Parasternal Heave +ve B.Sounds, Abd Soft, No tenderness, No organomegaly appriciated, No rebound - guarding or rigidity. No Cyanosis, Clubbing or edema, No new Rash or bruise  , poor coordination, dysmetria.    Data Review:    CBC  Recent Labs Lab 01/13/16 0015 01/13/16 0503 01/18/16 0704  WBC 5.6 5.6 6.7  HGB 12.7 12.1 11.8*  HCT 38.5 35.5* 35.9*  PLT 285 261 251  MCV 90.2 89.4 91.6    MCH 29.7 30.5 30.1  MCHC 33.0 34.1 32.9  RDW 13.4 13.0 13.3  LYMPHSABS 0.8  --  0.6*  MONOABS 0.6  --  0.6  EOSABS 0.2  --  0.0  BASOSABS 0.0  --  0.0    Chemistries   Recent Labs Lab 01/13/16 0015 01/13/16 0503 01/19/16 0902  NA 136 139 133*  K 3.7 4.0 3.2*  CL 103 106 102  CO2 _1 GLUCOSE 110* 105* 108*  BUN _2 CREATININE 0.56 0.53 0.66  CALCIUM 9.3 9.4 9.4  AST 21 23  --   ALT 17 16  --   ALKPHOS 88 82  --   BILITOT 0.5 0.7  --    ------------------------------------------------------------------------------------------------------------------ No results for input(s): CHOL, HDL, LDLCALC, TRIG, CHOLHDL, LDLDIRECT in the last 72 hours.  No results found for: HGBA1C ------------------------------------------------------------------------------------------------------------------ No results for input(s): TSH, T4TOTAL, T3FREE, THYROIDAB in the last 72 hours.  Invalid input(s): FREET3 ------------------------------------------------------------------------------------------------------------------ No results for input(s): VITAMINB12, FOLATE, FERRITIN, TIBC, IRON, RETICCTPCT in the last 72 hours.  Coagulation profile  Recent Labs Lab 01/18/16 0704  INR 1.09    No results for input(s): DDIMER in the last 72 hours.  Cardiac Enzymes No results for input(s): CKMB, TROPONINI, MYOGLOBIN in the last 168 hours.  Invalid input(s): CK ------------------------------------------------------------------------------------------------------------------ No results found for: BNP  Inpatient  Medications  Scheduled Meds: . heparin subcutaneous  5,000 Units Subcutaneous Q8H  . Immune Globulin 10%  400 mg/kg Intravenous Q24 Hr x 5  . pantoprazole  40 mg Oral Daily  . polyethylene glycol  17 g Oral Daily  . potassium chloride SA  20 mEq Oral Daily  . sodium chloride flush  3 mL Intravenous Q12H  . thiamine  100 mg Oral Daily  . Vitamin D (Ergocalciferol)  50,000  Units Oral Weekly   Continuous Infusions: . dextrose 5 % and 0.9% NaCl 75 mL/hr at 01/19/16 1247  . lactated ringers 10 mL/hr at 01/14/16 1538   PRN Meds:.acetaminophen **OR** acetaminophen, bisacodyl, LORazepam, mineral oil, traMADol  Micro Results No results found for this or any previous visit (from the past 240 hour(s)).  Radiology Reports Ct Head Wo Contrast  Result Date: 01/13/2016 CLINICAL DATA:  45 y/o F; increased weakness and difficulty with speech mobility over the last 3 weeks. History of non-Hodgkin's lymphoma. EXAM: CT HEAD WITHOUT CONTRAST TECHNIQUE: Contiguous axial images were obtained from the base of the skull through the vertex without intravenous contrast. COMPARISON:  MRI of the brain dated 12/20/2015. FINDINGS: Brain: No evidence of acute infarction, hemorrhage, hydrocephalus, extra-axial collection or mass lesion/mass effect. Moderate generalized atrophy for patient's age. Few lucencies in white matter and within the pons corresponding FLAIR signal abnormality on prior MRI and are nonspecific. Vascular: No hyperdense vessel or unexpected calcification. Skull: No fracture is identified.  No suspicious lesion. Sinuses/Orbits: No acute finding. Other: No unexpected finding. IMPRESSION: No acute intracranial abnormality is identified. Stable generalized volume loss and mild nonspecific white matter lucencies possibly representing microvascular disease or posttreatment changes. Electronically Signed   By: Kristine Garbe M.D.   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 Staging. Subsequent encounter. EXAM: CT NECK WITH CONTRAST TECHNIQUE: Multidetector CT imaging of the neck was performed using the standard protocol following the bolus administration of intravenous contrast. CONTRAST:  134m ISOVUE-300 IOPAMIDOL (ISOVUE-300) INJECTION 61% in conjunction  with contrast enhanced imaging of the chest, abdomen, and pelvis reported separately. COMPARISON:  Brain MRI 01/14/2016. Head CT without contrast 01/13/2016. PET-CT 11/03/2015. FINDINGS: Pharynx and larynx: Larynx is normal in best seen on series 6, image 70. Pharyngeal soft tissue contours are within normal limits. Negative parapharyngeal and retropharyngeal spaces. Salivary glands: Negative sublingual space, sublingual glands, submandibular glands, and parotid glands. Thyroid: Negative. Lymph nodes: No cervical lymphadenopathy. Only diminutive (4 mm short axis or smaller) bilateral lymph nodes are identified. Vascular: Major vascular structures in the neck and at skullbase are patent and appear normal. Limited intracranial: Stable from yesterday brain MRI. Visualized orbits: Negative. Mastoids and visualized paranasal sinuses: Visualized paranasal sinuses and mastoids are stable and well pneumatized. Skeleton: No osseous abnormality identified. Upper chest: Chest CT is reported separately today. IMPRESSION: 1. Negative neck CT.  No evidence of lymphoma in the neck. 2. CT chest abdomen and pelvis today reported separately. Electronically Signed   By: HGenevie AnnM.D.   On: 01/15/2016 18:23   Ct Chest W Contrast  Result Date: 01/15/2016 CLINICAL DATA:  Recurrent lymphoma. EXAM: CT CHEST, ABDOMEN, AND PELVIS WITH CONTRAST TECHNIQUE: Multidetector CT imaging of the chest, abdomen and pelvis was performed following the standard protocol during bolus administration of intravenous contrast. CONTRAST:  1026mISOVUE-300 IOPAMIDOL (ISOVUE-300) INJECTION 61% COMPARISON:  None. FINDINGS: CT CHEST FINDINGS Cardiovascular: The heart and great vessels  are normal. Mediastinum/Nodes: Normal.  No adenopathy. Lungs/Pleura: Minimal linear opacity anteriorly in the right lung on series 3, image 61 is stable and of doubtful significance. This may represent a tiny region of scar. The lungs are otherwise normal. Musculoskeletal: Normal.  CT ABDOMEN PELVIS FINDINGS Hepatobiliary: Normal. Pancreas: Normal. Spleen: Normal. Adrenals/Urinary Tract: Normal. Stomach/Bowel: The stomach and small bowel are normal. Moderate fecal loading is seen throughout the colon. The colon is otherwise normal. The cecum is seen in the right side of the pelvis. Visualized portions of the appendix are normal. Vascular/Lymphatic: No aneurysm or dissection.  No adenopathy. Reproductive: The patient is status post tubal ligation. The uterus and adnexae are normal. Other: No other abnormalities. Musculoskeletal: No acute bony abnormalities. IMPRESSION: 1. No evidence of lymphoma recurrence in the chest, abdomen, or pelvis. 2. Moderate fecal loading throughout the colon. 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 Recent PET scan was negative for disease. Recent onset of difficulty walking and speech issues. 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 EXAM: MRI HEAD WITHOUT AND WITH CONTRAST TECHNIQUE: Multiplanar, multiecho pulse sequences of the brain and surrounding structures were obtained without and with intravenous contrast. CONTRAST:  24m MULTIHANCE GADOBENATE DIMEGLUMINE 529 MG/ML IV SOLN COMPARISON:  12/20/2015. FINDINGS: No acute stroke or hemorrhage.  No mass lesion or extra-axial fluid. Global atrophy, hydrocephalus ex vacuo. Significant premature brain substance loss affecting the cerebral hemispheres and cerebellum. Premature for age subcortical greater than periventricular white matter signal abnormality, also involving the pons. No definite involvement of the cerebellar white matter. There is moderate interval progression of the T2 and FLAIR hyperintensity in the pons, and to a lesser degree, progression in the subcortical white matter, compared with the study 1 month  earlier. These show no restriction, susceptibility, or postcontrast enhancement. Partial empty sella. Flow voids are maintained. Extracranial soft tissues are unremarkable. Post infusion imaging through the entire head demonstrates no abnormal enhancement of brain or meninges. IMPRESSION: Interval progression of white matter signal abnormality, greatest in the pons, compared to the study 1 month earlier. These white matter lesions show no restriction, susceptibility, or postcontrast enhancement. 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 Global atrophy affecting the cerebral hemispheres, brainstem, and cerebellum, stable from priors. No areas of restricted diffusion or postcontrast enhancement. Electronically Signed   By: JStaci RighterM.D.   On: 01/14/2016 18:37   Mr BJeri CosWSWContrast  Result Date: 12/20/2015 CLINICAL DATA:  Blurred vision with difficulty walking. History of non-Hodgkin's lymphoma and chemotherapy. EXAM: MRI HEAD WITHOUT AND WITH CONTRAST TECHNIQUE: Multiplanar, multiecho pulse sequences of the brain and surrounding structures were obtained without and with intravenous contrast. CONTRAST:  174mMULTIHANCE GADOBENATE DIMEGLUMINE 529 MG/ML IV SOLN COMPARISON:  None. FINDINGS: Mild atrophy.  Mild ventricular enlargement consistent with atrophy. Negative for acute infarct. Scattered small hyperintensities in the deep white matter bilaterally and in the pons. This may be related to prior chemotherapy or chronic ischemia. Negative for intracranial hemorrhage.  No fluid collection Negative for mass or edema Postcontrast imaging demonstrates normal enhancement. No enhancing mass. Normal venous enhancement. Mild mucosal edema paranasal sinuses. Normal orbit. Pituitary not enlarged. IMPRESSION: No acute intracranial abnormality Generalized atrophy. Chronic appearing changes in the white matter and pons which may be related to prior chemotherapy or chronic  microvascular ischemia. Electronically Signed  By: Franchot Gallo M.D.   On: 12/20/2015 15:33  Ct Abdomen Pelvis W Contrast  Result Date: 01/15/2016 CLINICAL DATA:  Recurrent lymphoma. EXAM: CT CHEST, ABDOMEN, AND PELVIS WITH CONTRAST TECHNIQUE: Multidetector CT imaging of the chest, abdomen and pelvis was performed following the standard protocol during bolus administration of intravenous contrast. CONTRAST:  173m ISOVUE-300 IOPAMIDOL (ISOVUE-300) INJECTION 61% COMPARISON:  None. FINDINGS: CT CHEST FINDINGS Cardiovascular: The heart and great vessels are normal. Mediastinum/Nodes: Normal.  No adenopathy. Lungs/Pleura: Minimal linear opacity anteriorly in the right lung on series 3, image 61 is stable and of doubtful significance. This may represent a tiny region of scar. The lungs are otherwise normal. Musculoskeletal: Normal. CT ABDOMEN PELVIS FINDINGS Hepatobiliary: Normal. Pancreas: Normal. Spleen: Normal. Adrenals/Urinary Tract: Normal. Stomach/Bowel: The stomach and small bowel are normal. Moderate fecal loading is seen throughout the colon. The colon is otherwise normal. The cecum is seen in the right side of the pelvis. Visualized portions of the appendix are normal. Vascular/Lymphatic: No aneurysm or dissection.  No adenopathy. Reproductive: The patient is status post tubal ligation. The uterus and adnexae are normal. Other: No other abnormalities. Musculoskeletal: No acute bony abnormalities. IMPRESSION: 1. No evidence of lymphoma recurrence in the chest, abdomen, or pelvis. 2. Moderate fecal loading throughout the colon. Electronically Signed   By: DDorise BullionIII M.D   On: 01/15/2016 18:30   Ct Biopsy  Result Date: 01/18/2016 CLINICAL DATA:  History of cutaneous lymphoma. Bone marrow biopsy requested to assess for any evidence of lymphoma recurrence. EXAM: CT GUIDED BONE MARROW ASPIRATION AND CORE BIOPSY ANESTHESIA/SEDATION: Versed 2.0 mg IV, Fentanyl 100 mcg IV Total Moderate Sedation Time  20 minutes. The patient's level of consciousness and physiologic status were continuously monitored during the procedure by Radiology nursing. PROCEDURE: The procedure risks, benefits, and alternatives were explained to the patient's mother. Questions regarding the procedure were encouraged and answered. The patient's mother understands and consents to the procedure. A time-out was performed prior to the procedure. The right gluteal region was prepped with Betadine. Sterile gown and sterile gloves were used for the procedure. Local anesthesia was provided with 1% Lidocaine. Under CT guidance, an 11 gauge OnControl bone cutting needle was advanced from a posterior approach into the right iliac bone. Needle positioning was confirmed with CT. Initial non heparinized and heparinized aspirate samples were obtained of bone marrow. Core biopsy was performed with the 11 gauge needle. COMPLICATIONS: None FINDINGS: Inspection of initial aspirate did reveal visible particles. Intact core biopsy sample was obtained. IMPRESSION: CT guided bone marrow biopsy of right posterior iliac bone with both aspirate and core samples obtained. Electronically Signed   By: GAletta EdouardM.D.   On: 01/18/2016 11:07   Dg Hip Unilat With Pelvis 2-3 Views Left  Result Date: 01/13/2016 CLINICAL DATA:  45y/o  F; status post fall with anterior hip pain. EXAM: DG HIP (WITH OR WITHOUT PELVIS) 2-3V LEFT COMPARISON:  None. FINDINGS: There is no evidence of hip fracture or dislocation. There is no evidence of arthropathy or other focal bone abnormality. Clips project over the pelvis. IMPRESSION: No acute fracture or dislocation is identified. Electronically Signed   By: LKristine GarbeM.D.   On: 01/13/2016 02:07      Dallana Mavity M.D on 01/19/2016 at 2:46 PM  Between 7am to 7pm - Pager - [phone] After 7pm go to [email or web address] - password [email or web address] -  Office  [phone]

## 2016-01-19 NOTE — Anesthesia Postprocedure Evaluation (Signed)
Anesthesia Post Note  Patient: [name]  Procedure(s) Performed: Procedure(s) (LRB): RADIOLOGY WITH ANESTHESIA (N/A)  Patient location during evaluation: PACU Anesthesia Type: General Level of consciousness: sedated and patient cooperative Pain management: pain level controlled Vital Signs Assessment: post-procedure vital signs reviewed and stable Respiratory status: spontaneous breathing Cardiovascular status: stable Anesthetic complications: no    Last Vitals:  Vitals:   01/19/16 0900 01/19/16 1343  BP: (!) 97/48 (!) 110/59  Pulse: 94 87  Resp: 18 16  Temp: 36.9 C 36.6 C    Last Pain:  Vitals:   01/19/16 1343  TempSrc: Oral  PainSc:                  Nolon Nations

## 2016-01-19 NOTE — Progress Notes (Signed)
[name] had her bone marrow test yesterday. We should have some results tomorrow or Friday.  Neurologically, everything appears to be about the same. The paraneoplastic panel of antibiotics is still pending. I will think that this will take several days or possibly even a couple weeks before we get those results.  She still has the neurological issues with the cerebellar dysfunction. She is getting physical therapy.  She's had no fever. Her blood pressure has been a little on the lower side but this probably is not unusual for her.  Yesterday, she had labs done. Her white cell count 6.7. Hemoglobin 11.8 and platelet count 2 51,000 area  An ammonia level was done which was 11.  On her physical exam, there is really no changes.  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 Why she has this is not clear. Again we will see what the bone marrow shows area if there is lymphoma in the bone marrow, then she may need to be retreated and see if this helps her neurological issues.  Again, I only found one case report of a follicular lymphoma causing cerebellar degeneration. In this report, the patient presented with neurological issues. She never did. She had cutaneous lymphoma. She got through therapy. She got into remission by PET scan.  I have not seen any reports of delayed toxicity from chemotherapy that she had. I suppose this is always of possibility.  We'll continue to follow along.  I very much appreciate how much care that she is getting from everybody up on [facility]. You all are doing a great job.  Erin Haw, MD  Philippians 4:13

## 2016-01-19 NOTE — Clinical Social Work Placement (Signed)
   CLINICAL SOCIAL WORK PLACEMENT  NOTE  Date:  01/19/2016  Patient Details  Name: [name] MRN: [id number] Date of Birth: 04-25-1971  Clinical Social Work is seeking post-discharge placement for this patient at the [place] level of care (*CSW will initial, date and re-position this form in  chart as items are completed):  Yes   Patient/family provided with [facility] Work Department's list of facilities offering this level of care within the geographic area requested by the patient (or if unable, by the patient's family).  Yes   Patient/family informed of their freedom to choose among providers that offer the needed level of care, that participate in Medicare, Medicaid or managed care program needed by the patient, have an available bed and are willing to accept the patient.  Yes   Patient/family informed of [facility]'s ownership interest in [facility] and [facility], as well as of the fact that they are under no obligation to receive care at these facilities.  PASRR submitted to EDS on 01/19/16     PASRR number received on 01/19/16     Existing PASRR number confirmed on       FL2 transmitted to all facilities in geographic area requested by pt/family on 01/19/16     FL2 transmitted to all facilities within larger geographic area on       Patient informed that his/her managed care company has contracts with or will negotiate with certain facilities, including the following:            Patient/family informed of bed offers received.  Patient chooses bed at       Physician recommends and patient chooses bed at      Patient to be transferred to   on  .  Patient to be transferred to facility by       Patient family notified on   of transfer.  Name of family member notified:        PHYSICIAN       Additional Comment:    _______________________________________________ Darden Dates, LCSW 01/19/2016, 5:12 PM

## 2016-01-20 LAB — BASIC METABOLIC PANEL
Anion gap: 7 (ref 5–15)
BUN: 12 mg/dL (ref 6–20)
CO2: 25 mmol/L (ref 22–32)
CREATININE: 0.54 mg/dL (ref 0.44–1.00)
Calcium: 9.2 mg/dL (ref 8.9–10.3)
Chloride: 100 mmol/L — ABNORMAL LOW (ref 101–111)
Glucose, Bld: 127 mg/dL — ABNORMAL HIGH (ref 65–99)
POTASSIUM: 3.6 mmol/L (ref 3.5–5.1)
SODIUM: 132 mmol/L — AB (ref 135–145)

## 2016-01-20 LAB — GLUCOSE, CAPILLARY
GLUCOSE-CAPILLARY: 143 mg/dL — AB (ref 65–99)
GLUCOSE-CAPILLARY: 89 mg/dL (ref 65–99)
GLUCOSE-CAPILLARY: 118 mg/dL — AB (ref 65–99)
Glucose-Capillary: 105 mg/dL — ABNORMAL HIGH (ref 65–99)

## 2016-01-20 LAB — PHOSPHORUS: PHOSPHORUS: 3.5 mg/dL (ref 2.5–4.6)

## 2016-01-20 MED ORDER — LORAZEPAM 2 MG/ML IJ SOLN
2.0000 mg | Freq: Once | INTRAMUSCULAR | Status: AC
Start: 2016-01-20 — End: 2016-01-20
  Administered 2016-01-20: 2 mg via INTRAMUSCULAR

## 2016-01-20 MED ORDER — LORAZEPAM 2 MG/ML IJ SOLN
INTRAMUSCULAR | Status: AC
  Filled 2016-01-20: qty 1

## 2016-01-20 MED ORDER — LORAZEPAM 2 MG/ML IJ SOLN
1.0000 mg | Freq: Once | INTRAMUSCULAR | Status: AC
  Administered 2016-01-20: 1 mg via INTRAVENOUS
  Filled 2016-01-20: qty 1

## 2016-01-20 NOTE — Progress Notes (Signed)
Patient ID: [name], [name]   DOB: [birth year], 45 y.o.   MRN: [id number]                                                                PROGRESS NOTE                                                                                                                                                                                                             Patient Demographics:    [name], Erin a 45 y.o. [name], DOB - 1971-02-06, [id number]  Admit date - 01/12/2016   Admitting Physician Theodis Blaze, MD  Outpatient Primary MD for the patient Erin Tammi Sou, MD  LOS - 7  Outpatient Specialists: Ulyses Jarred (neurologist)  Chief Complaint  Patient presents with  . Weakness       Brief Narrative  45 y.o. [name], w difficulty with walking as well as generalized weakness x 4 weeks pt has been having increase in falls. MRI brain 8/18 => Interval progression of white matter signal abnormality, greatest in the pons, compared to the study 1 month earlier. 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    Subjective:    [name] today with  No headache, No chest pain, Complaints of lower back pain, episode of irritation failure on the day where she pulled her IV access.  Assessment  & Plan :    Principal Problem:   Weakness Active Problems:   Ataxia   Gait abnormality   Ataxia/Gait problems with subacute progressive cerebral dysfunction - MRI brain from 01/14/16 shows abnormal T2 signal in the pons . 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 Paraneoplastic panel still pending, spoke with labs,  send out  to [facility], earliest would be 8/26. 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 - Ammonia, B-12 within normal limits - Even though low probability , but  will check CJ virus PCR to rule out PML, discussed with lab, they were added to lift over spinal fluid from last week.  H/o recurrent follicular continuous lymphoma (non-Hodgkin's) - last chemotherapy completed  05/2016 - Neurology input greatly appreciated, CT scans with no evidence of lymphoma recurrence, PET scan 2 months. Preadmission with no evidence of recurrence, LP with no evidence of malignant cells , bone marrow biopsy 8/22, results still pending.  Hypokalemia - Repleted  Code Status : Full  Family communication: D/W mother  at bedside  Disposition Plan  :  pending further workup  Consults  :  Oncology/neurology  DVT Prophylaxis  :  Heparin   Lab Results  Component Value Date   PLT 251 01/18/2016    Antibiotics  :   Anti-infectives    None        Objective:   Vitals:   01/20/16 0010 01/20/16 0100 01/20/16 0539 01/20/16 0958  BP: (!) 99/55 101/63 105/65 104/66  Pulse: 74 69 72 (!) 101  Resp:   20 20  Temp: 97.8 F (36.6 C) 98 F (36.7 C) 98.1 F (36.7 C) 98.7 F (37.1 C)  TempSrc:   Axillary Axillary  SpO2: 98% 98% 99% 98%  Weight:      Height:        Wt Readings from Last 3 Encounters:  01/18/16 51.2 kg (112 lb 12.8 oz)  12/30/15 48.9 kg (107 lb 12.8 oz)  12/27/15 48.8 kg (107 lb 8 oz)     Intake/Output Summary (Last 24 hours) at 01/20/16 1119 Last data filed at 01/20/16 0700  Gross per 24 hour  Intake              720 ml  Output                3 ml  Net              717 ml     Physical Exam  Sleepy,Remains lethargic and confused today.[name] Neck,No JVD Symmetrical Chest wall movement, Good air movement bilaterally, CTAB RRR,No Gallops,Rubs or new Murmurs, No Parasternal  Heave +ve B.Sounds, Abd Soft, No tenderness, No organomegaly appriciated, No rebound - guarding or rigidity. No Cyanosis, Clubbing or edema, No new Rash or bruise  , poor coordination, dysmetria.    Data Review:    CBC  Recent Labs Lab 01/18/16 0704  WBC 6.7  HGB 11.8*  HCT 35.9*  PLT 251  MCV 91.6  MCH 30.1  MCHC 32.9  RDW 13.3  LYMPHSABS 0.6*  MONOABS 0.6  EOSABS 0.0  BASOSABS 0.0    Chemistries   Recent Labs Lab 01/19/16 0902 01/20/16 0545  NA 133* 132*  K 3.2* 3.6  CL 102 100*  CO2 25 25  GLUCOSE 108* 127*  BUN 12 12  CREATININE 0.66 0.54  CALCIUM 9.4 9.2   ------------------------------------------------------------------------------------------------------------------ No results for input(s): CHOL, HDL, LDLCALC, TRIG, CHOLHDL, LDLDIRECT in the last 72 hours.  No results found for: HGBA1C ------------------------------------------------------------------------------------------------------------------ No results for input(s): TSH, T4TOTAL, T3FREE, THYROIDAB in the last 72 hours.  Invalid input(s): FREET3 ------------------------------------------------------------------------------------------------------------------ No results for input(s): VITAMINB12, FOLATE, FERRITIN, TIBC, IRON, RETICCTPCT in the last 72 hours.  Coagulation profile  Recent Labs Lab 01/18/16 0704  INR 1.09    No results for input(s): DDIMER in the last 72 hours.  Cardiac Enzymes No results for input(s): CKMB, TROPONINI, MYOGLOBIN in the last 168 hours.  Invalid input(s): CK ------------------------------------------------------------------------------------------------------------------ No results found for: BNP  Inpatient  Medications  Scheduled Meds: . heparin subcutaneous  5,000 Units Subcutaneous Q8H  . LORazepam      . LORazepam  1 mg Intravenous Once  . pantoprazole  40 mg Oral Daily  . polyethylene glycol  17 g Oral Daily  . potassium chloride SA  20 mEq Oral  Daily  . potassium chloride  40 mEq Oral Once  . sodium chloride flush  3 mL Intravenous Q12H  . thiamine  100 mg Oral Daily  . Vitamin D (Ergocalciferol)  50,000 Units Oral Weekly   Continuous Infusions: . dextrose 5 % and 0.9% NaCl 75 mL/hr at 01/20/16 0057  . lactated ringers 10 mL/hr at 01/14/16 1538   PRN Meds:.acetaminophen **OR** acetaminophen, bisacodyl, LORazepam, mineral oil, traMADol  Micro Results No results found for this or any previous visit (from the past 240 hour(s)).  Radiology Reports Ct Head Wo Contrast  Result Date: 01/13/2016 CLINICAL DATA:  45 y/o F; increased weakness and difficulty with speech mobility over the last 3 weeks. History of non-Hodgkin's lymphoma. EXAM: CT HEAD WITHOUT CONTRAST TECHNIQUE: Contiguous axial images were obtained from the base of the skull through the vertex without intravenous contrast. COMPARISON:  MRI of the brain dated 12/20/2015. FINDINGS: Brain: No evidence of acute infarction, hemorrhage, hydrocephalus, extra-axial collection or mass lesion/mass effect. Moderate generalized atrophy for patient's age. Few lucencies in white matter and within the pons corresponding FLAIR signal abnormality on prior MRI and are nonspecific. Vascular: No hyperdense vessel or unexpected calcification. Skull: No fracture Erin identified.  No suspicious lesion. Sinuses/Orbits: No acute finding. Other: No unexpected finding. IMPRESSION: No acute intracranial abnormality Erin identified. Stable generalized volume loss and mild nonspecific white matter lucencies possibly representing microvascular disease or posttreatment changes. Electronically Signed   By: Kristine Garbe M.D.   On: 01/13/2016 00:46   Ct Soft Tissue Neck W Contrast  Result Date: 01/15/2016 CLINICAL DATA:  45 year old [name] with recurrent cutaneous non-Hodgkin lymphoma. Staging. Subsequent encounter. EXAM: CT NECK WITH CONTRAST TECHNIQUE: Multidetector CT imaging of the neck was performed  using the standard protocol following the bolus administration of intravenous contrast. CONTRAST:  172m ISOVUE-300 IOPAMIDOL (ISOVUE-300) INJECTION 61% in conjunction with contrast enhanced imaging of the chest, abdomen, and pelvis reported separately. COMPARISON:  Brain MRI 01/14/2016. Head CT without contrast 01/13/2016. PET-CT 11/03/2015. FINDINGS: Pharynx and larynx: Larynx Erin normal in best seen on series 6, image 70. Pharyngeal soft tissue contours are within normal limits. Negative parapharyngeal and retropharyngeal spaces. Salivary glands: Negative sublingual space, sublingual glands, submandibular glands, and parotid glands. Thyroid: Negative. Lymph nodes: No cervical lymphadenopathy. Only diminutive (4 mm short axis or smaller) bilateral lymph nodes are identified. Vascular: Major vascular structures in the neck and at skullbase are patent and appear normal. Limited intracranial: Stable from yesterday brain MRI. Visualized orbits: Negative. Mastoids and visualized paranasal sinuses: Visualized paranasal sinuses and mastoids are stable and well pneumatized. Skeleton: No osseous abnormality identified. Upper chest: Chest CT Erin reported separately today. IMPRESSION: 1. Negative neck CT.  No evidence of lymphoma in the neck. 2. CT chest abdomen and pelvis today reported separately. Electronically Signed   By: HGenevie AnnM.D.   On: 01/15/2016 18:23   Ct Chest W Contrast  Result Date: 01/15/2016 CLINICAL DATA:  Recurrent lymphoma. EXAM: CT CHEST, ABDOMEN, AND PELVIS WITH CONTRAST TECHNIQUE: Multidetector CT imaging of the chest, abdomen and pelvis was performed following the standard protocol during bolus administration of intravenous contrast. CONTRAST:  1044mISOVUE-300 IOPAMIDOL (ISOVUE-300) INJECTION 61% COMPARISON:  None. FINDINGS: CT CHEST FINDINGS Cardiovascular: The heart and great vessels are normal. Mediastinum/Nodes: Normal.  No adenopathy. Lungs/Pleura: Minimal linear opacity anteriorly in the  right lung on series 3, image 61 Erin stable and of doubtful significance. This may represent a tiny region of scar. The lungs are otherwise normal. Musculoskeletal: Normal. CT ABDOMEN PELVIS FINDINGS Hepatobiliary: Normal. Pancreas: Normal. Spleen: Normal. Adrenals/Urinary Tract: Normal. Stomach/Bowel: The stomach and small bowel are normal. Moderate fecal loading Erin seen throughout the colon. The colon Erin otherwise normal. The cecum Erin seen in the right side of the pelvis. Visualized portions of the appendix are normal. Vascular/Lymphatic: No aneurysm or dissection.  No adenopathy. Reproductive: The patient Erin status post tubal ligation. The uterus and adnexae are normal. Other: No other abnormalities. Musculoskeletal: No acute bony abnormalities. IMPRESSION: 1. No evidence of lymphoma recurrence in the chest, abdomen, or pelvis. 2. Moderate fecal loading throughout the colon. 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 Recent PET scan was negative for disease. Recent onset of difficulty walking and speech issues. 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 EXAM: MRI HEAD WITHOUT AND WITH CONTRAST TECHNIQUE: Multiplanar, multiecho pulse sequences of the brain and surrounding structures were obtained without and with intravenous contrast. CONTRAST:  35m MULTIHANCE GADOBENATE DIMEGLUMINE 529 MG/ML IV SOLN COMPARISON:  12/20/2015. FINDINGS: No acute stroke or hemorrhage.  No mass lesion or extra-axial fluid. Global atrophy, hydrocephalus ex vacuo. Significant premature brain substance loss affecting the cerebral hemispheres and cerebellum. Premature for age subcortical greater than periventricular white matter signal abnormality, also involving the pons. No definite involvement of the cerebellar white matter. There Erin moderate  interval progression of the T2 and FLAIR hyperintensity in the pons, and to a lesser degree, progression in the subcortical white matter, compared with the study 1 month earlier. These show no restriction, susceptibility, or postcontrast enhancement. Partial empty sella. Flow voids are maintained. Extracranial soft tissues are unremarkable. Post infusion imaging through the entire head demonstrates no abnormal enhancement of brain or meninges. IMPRESSION: Interval progression of white matter signal abnormality, greatest in the pons, compared to the study 1 month earlier. These white matter lesions show no restriction, susceptibility, or postcontrast enhancement. 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 Global atrophy affecting the cerebral hemispheres, brainstem, and cerebellum, stable from priors. No areas of restricted diffusion or postcontrast enhancement. Electronically Signed   By: JStaci RighterM.D.   On: 01/14/2016 18:37   Ct Abdomen Pelvis W Contrast  Result Date: 01/15/2016 CLINICAL DATA:  Recurrent lymphoma. EXAM: CT CHEST, ABDOMEN, AND PELVIS WITH CONTRAST TECHNIQUE: Multidetector CT imaging of the chest, abdomen and pelvis was performed following the standard protocol during bolus administration of intravenous contrast. CONTRAST:  1074mISOVUE-300 IOPAMIDOL (ISOVUE-300) INJECTION 61% COMPARISON:  None. FINDINGS: CT CHEST FINDINGS Cardiovascular: The heart and great vessels are normal. Mediastinum/Nodes: Normal.  No adenopathy. Lungs/Pleura: Minimal linear opacity anteriorly in the right lung on series 3, image 61 Erin stable and of doubtful significance. This may represent a tiny region of scar. The lungs are otherwise normal. Musculoskeletal: Normal. CT ABDOMEN PELVIS FINDINGS Hepatobiliary: Normal. Pancreas: Normal. Spleen: Normal. Adrenals/Urinary Tract: Normal. Stomach/Bowel: The stomach and small bowel are normal. Moderate fecal loading Erin seen  throughout the colon. The colon Erin otherwise normal. The cecum Erin seen in the right side of  the pelvis. Visualized portions of the appendix are normal. Vascular/Lymphatic: No aneurysm or dissection.  No adenopathy. Reproductive: The patient Erin status post tubal ligation. The uterus and adnexae are normal. Other: No other abnormalities. Musculoskeletal: No acute bony abnormalities. IMPRESSION: 1. No evidence of lymphoma recurrence in the chest, abdomen, or pelvis. 2. Moderate fecal loading throughout the colon. Electronically Signed   By: Dorise Bullion III M.D   On: 01/15/2016 18:30   Ct Biopsy  Result Date: 01/18/2016 CLINICAL DATA:  History of cutaneous lymphoma. Bone marrow biopsy requested to assess for any evidence of lymphoma recurrence. EXAM: CT GUIDED BONE MARROW ASPIRATION AND CORE BIOPSY ANESTHESIA/SEDATION: Versed 2.0 mg IV, Fentanyl 100 mcg IV Total Moderate Sedation Time 20 minutes. The patient's level of consciousness and physiologic status were continuously monitored during the procedure by Radiology nursing. PROCEDURE: The procedure risks, benefits, and alternatives were explained to the patient's mother. Questions regarding the procedure were encouraged and answered. The patient's mother understands and consents to the procedure. A time-out was performed prior to the procedure. The right gluteal region was prepped with Betadine. Sterile gown and sterile gloves were used for the procedure. Local anesthesia was provided with 1% Lidocaine. Under CT guidance, an 11 gauge OnControl bone cutting needle was advanced from a posterior approach into the right iliac bone. Needle positioning was confirmed with CT. Initial non heparinized and heparinized aspirate samples were obtained of bone marrow. Core biopsy was performed with the 11 gauge needle. COMPLICATIONS: None FINDINGS: Inspection of initial aspirate did reveal visible particles. Intact core biopsy sample was obtained. IMPRESSION: CT guided bone  marrow biopsy of right posterior iliac bone with both aspirate and core samples obtained. Electronically Signed   By: Aletta Edouard M.D.   On: 01/18/2016 11:07   Dg Hip Unilat With Pelvis 2-3 Views Left  Result Date: 01/13/2016 CLINICAL DATA:  45 y/o  F; status post fall with anterior hip pain. EXAM: DG HIP (WITH OR WITHOUT PELVIS) 2-3V LEFT COMPARISON:  None. FINDINGS: There Erin no evidence of hip fracture or dislocation. There Erin no evidence of arthropathy or other focal bone abnormality. Clips project over the pelvis. IMPRESSION: No acute fracture or dislocation Erin identified. Electronically Signed   By: Kristine Garbe M.D.   On: 01/13/2016 02:07      Rayden Scheper M.D on 01/20/2016 at 11:19 AM  Between 7am to 7pm - Pager - [phone]  After 7pm go to [email or web address] - password [facility]  [facility] -  Office  [phone]

## 2016-01-20 NOTE — Care Management Note (Signed)
Case Management Note  Patient Details  Name: [name] MRN: [id number] Date of Birth: [birth year]  Subjective/Objective:                    Action/Plan: Plan is SNF once patient is medically ready. CM following for d/c needs.   Expected Discharge Date:   (unknown)               Expected Discharge Plan:  [place]  In-House Referral:     Discharge planning Services     Post Acute Care Choice:    Choice offered to:     DME Arranged:    DME Agency:     HH Arranged:    [place] Agency:     Status of Service:  In process, will continue to follow  If discussed at Long Length of Stay Meetings, dates discussed:    Additional Comments:  Pollie Friar, RN 01/20/2016, 2:46 PM

## 2016-01-20 NOTE — Progress Notes (Signed)
Unfortunately, it seems as if [name] neurological state is worsening. She is agitated. She has on protective gloves. She has some incontinence of urine.  We are still awaiting the result of the bone marrow biopsy. I think it will be back today.  Her labs have looked reluctantly decent.  I really wonder about the possibility of her having PML . It seems like she might be behaving this way neurologically. I know this is a difficult diagnosis to make. I wonder if she needs another spinal tap to check for the JC virus. I know that brain biopsies are occasionally done.  I'm just not aware of somebody developing this this long after treatment. However, I suppose this is always a possibility.  She did get IVIG. I'm not sure when the response would be seen with this if this is an autoimmune process.  She has been afebrile. Her blood pressure has been okay.  She can barely talk., Sure how well she can swallow.  I really do not think that she is anywhere close to being ready for discharge. We still do not know exactly what is going on.  Again, given the clinical scenario, there is always the concern regarding PML.. Obviously, this is incredibly rare. I'm sure that neurology can help [place] out with this if there is any concern on their part regarding PML.  I just feel terrible for her. This is been a miserable year for her given her husband's death. She's been under an incredible amount of stress. How to deal with this now is just very difficult to understand.  I very much appreciate the compassion of the staff upon 47M. This is just a very emotional case. Everybody is doing a wonderful job trying to help her out.  Lattie Haw, MD  Psalm 31:3

## 2016-01-20 NOTE — Progress Notes (Signed)
Subjective:  [name] is resting comfortably in bed. It appears she was a bit agitated last night. She now has a Actuary. She otherwise responds to commands appropriately. The question of a possible process such as PML has been raised. Additional studies to investigate this possibility of being requested.  Exam: Vitals:   01/20/16 0539 01/20/16 0958  BP: 105/65 104/66  Pulse: 72 (!) 101  Resp: 20 20  Temp: 98.1 F (36.7 C) 98.7 F (37.1 C)    HEENT-  Normocephalic, no lesions, without obvious abnormality.  Normal external eye and conjunctiva.  Normal TM's bilaterally.  Normal auditory canals and external ears. Normal external nose, mucus membranes and septum.  Normal pharynx. Cardiovascular- regular rate and rhythm, S1, S2 normal, no murmur, click, rub or gallop, pulses palpable throughout   Lungs- chest clear, no wheezing, rales, normal symmetric air entry, Heart exam - S1, S2 normal, no murmur, no gallop, rate regular Abdomen- soft, non-tender; bowel sounds normal; no masses,  no organomegaly Extremities- less then 2 second capillary refill    Gen: In bed, NAD MS: A bit lethargic today, but arouses and follows commands. CN: Cranial nerves II through XII are intact. Motor: 5 out of 5 bilaterally. Sensory: Sensation is intact. DTR: 1-2+.  Pertinent Labs/Diagnostics: Reviewed.    Impression:   Crystals exam appears largely unchanged. She is a bit lethargic today. She underwent a bone marrow biopsy. The results are pending. The paraneoplastic panel is pending. The possibility of PML has been suggested. Other rare conditions such as Wynelle Cleveland disease could potentially present in a similar fashion.   Recommendations:  1. Await results of additional studies.   James A. Tasia Catchings, M.D. Neurohospitalist Phone: [phone]   01/20/2016, 12:46 PM

## 2016-01-20 NOTE — Addendum Note (Signed)
Addendum  created 01/20/16 1817 by Effie Berkshire, MD   Anesthesia Staff edited

## 2016-01-20 NOTE — Progress Notes (Signed)
Marcos Eke, RN Registered Nurse Signed   Progress Notes Date of Service: 01/20/2016 3:30 AM      [] Hide copied text [] Hover for attribution information Pt woke up from sleep very agitated and confused , incontinent of urine and trying to get OOB .Nursing staff assisted pt back to bed with peri care given,  patient reoriented and redirected to room and surrounding but remained hysterical .uncooperative,MD on call text page and awaiting a call back .Nursing staff at bedside trying to calm pt down.

## 2016-01-20 NOTE — Progress Notes (Signed)
Last dose of IV IG completed and patient tolerated well Vs within baseline no acute distrss. however pt has been confused with severe anxiety and  pulling out telemetry leads off all shift. Situation explained to pt but pt continue to be non compliance. MD on call made aware. Ativan given  but not very effective.[name]

## 2016-01-20 NOTE — Progress Notes (Signed)
Physical Therapy Treatment Patient Details Name: [name] MRN: [id number] DOB: [birth year] Today's Date: 01/20/2016    History of Present Illness [name] is a 45 yo female who was admitted with generalized weakness, difficulty walking, and thoracic pain.  PMH includes non-hodgkin's lymphoma, HSV-2, cerebellar ataxia, anxiety and depression.    PT Comments    Patient limited by lethargy this session however cooperative with therapy. Pt with little verbal communication this session. Current plan remains appropriate to address deficits of strength, balance, and cognition. PT will continue to follow acutely.    Follow Up Recommendations  SNF;Supervision/Assistance - 24 hour     Equipment Recommendations  None recommended by PT    Recommendations for Other Services       Precautions / Restrictions Precautions Precautions: Fall Restrictions Weight Bearing Restrictions: No    Mobility  Bed Mobility Overal bed mobility: Needs Assistance Bed Mobility: Supine to Sit     Supine to sit: Min assist     General bed mobility comments: multiodal cues for sequencing and initiating tasks; assist to return to supine    Transfers Overall transfer level: Needs assistance Equipment used: Rolling walker (2 wheeled);None Transfers: Sit to/from Stand Sit to Stand: Mod assist;+2 physical assistance;+2 safety/equipment;Max assist         General transfer comment: mod A to stand and max to maintain balance; X2 from EOB with HHA +2 first trial and RW second trial; multimodal cues for hand placement and initiation of task; heavy R lateral lean with pt leaning onto therapist and unable to correct with cues; cues to keep eyes open and for posture  Ambulation/Gait Ambulation/Gait assistance: Mod assist;+2 physical assistance Ambulation Distance (Feet):  (~3) Assistive device: Rolling walker (2 wheeled) Gait Pattern/deviations: Step-to pattern     General Gait Details: sidesteps  at EOB with assist to weight shift and manage RW as pt maintained R lateral lean and eyes closed most of time OOB; deferred further ambulation due to pt's level of arousal   Stairs            Wheelchair Mobility    Modified Rankin (Stroke Patients Only)       Balance     Sitting balance-Leahy Scale: Fair       Standing balance-Leahy Scale: Poor                      Cognition Arousal/Alertness: Lethargic;Suspect due to medications Behavior During Therapy: Flat affect (episodes of crying) Overall Cognitive Status: No family/caregiver present to determine baseline cognitive functioning Area of Impairment: Orientation;Attention;Following commands;Safety/judgement;Awareness;Problem solving Orientation Level: Disoriented to;Place;Time;Situation Current Attention Level: Focused   Following Commands: Follows one step commands consistently;Follows one step commands with increased time Safety/Judgement: Decreased awareness of safety Awareness: Intellectual Problem Solving: Slow processing;Decreased initiation;Requires verbal cues General Comments: pt lethargic throughout session and maintained eyes closed at least 50% time; pt with moments of tearfulness and with minimal verbal communication    Exercises      General Comments        Pertinent Vitals/Pain Pain Assessment: Faces Faces Pain Scale: No hurt Pain Intervention(s): Monitored during session    Home Living                      Prior Function            PT Goals (current goals can now be found in the care plan section) Acute Rehab PT Goals Patient Stated Goal: none stated  PT Goal Formulation: Patient unable to participate in goal setting Time For Goal Achievement: 01/25/16 Potential to Achieve Goals: Fair Progress towards PT goals: Not progressing toward goals - comment    Frequency  Min 3X/week    PT Plan Current plan remains appropriate    Co-evaluation             End of  Session Equipment Utilized During Treatment: Gait belt Activity Tolerance: Patient limited by lethargy Patient left: with nursing/sitter in room;in bed;with call bell/phone within reach;with restraints reapplied     Time: [id number] PT Time Calculation (min) (ACUTE ONLY): 23 min  Charges:  $Therapeutic Activity: 23-37 mins                    G Codes:      Salina April, PTA Pager: [phone]   01/20/2016, 2:18 PM

## 2016-01-21 ENCOUNTER — Inpatient Hospital Stay (HOSPITAL_COMMUNITY)
Admit: 2016-01-21 | Discharge: 2016-01-21 | Disposition: A | Discharge status: Home / Self Care | Payer: BLUE CROSS/BLUE SHIELD | Attending: Neurology | Admitting: Neurology

## 2016-01-21 ENCOUNTER — Inpatient Hospital Stay (HOSPITAL_COMMUNITY): Payer: BLUE CROSS/BLUE SHIELD

## 2016-01-21 DIAGNOSIS — R410 Disorientation, unspecified: Secondary | ICD-10-CM

## 2016-01-21 DIAGNOSIS — R000 Tachycardia, unspecified: Secondary | ICD-10-CM

## 2016-01-21 DIAGNOSIS — R4182 Altered mental status, unspecified: Secondary | ICD-10-CM | POA: Insufficient documentation

## 2016-01-21 DIAGNOSIS — R509 Fever, unspecified: Secondary | ICD-10-CM

## 2016-01-21 LAB — CBC WITH DIFFERENTIAL/PLATELET
Basophils Absolute: 0 K/uL (ref 0.0–0.1)
Basophils Relative: 0 %
Eosinophils Absolute: 0 K/uL (ref 0.0–0.7)
Eosinophils Relative: 0 %
HCT: 32.8 % — ABNORMAL LOW (ref 36.0–46.0)
Hemoglobin: 11 g/dL — ABNORMAL LOW (ref 12.0–15.0)
Lymphocytes Relative: 10 %
Lymphs Abs: 0.7 K/uL (ref 0.7–4.0)
MCH: 29.8 pg (ref 26.0–34.0)
MCHC: 33.5 g/dL (ref 30.0–36.0)
MCV: 88.9 fL (ref 78.0–100.0)
Monocytes Absolute: 0.7 K/uL (ref 0.1–1.0)
Monocytes Relative: 12 %
Neutro Abs: 4.9 K/uL (ref 1.7–7.7)
Neutrophils Relative %: 78 %
Platelets: 214 K/uL (ref 150–400)
RBC: 3.69 MIL/uL — ABNORMAL LOW (ref 3.87–5.11)
RDW: 13.2 % (ref 11.5–15.5)
WBC: 6.3 K/uL (ref 4.0–10.5)

## 2016-01-21 LAB — URINALYSIS, ROUTINE W REFLEX MICROSCOPIC
BILIRUBIN URINE: NEGATIVE
Glucose, UA: NEGATIVE mg/dL
Hgb urine dipstick: NEGATIVE
KETONES UR: NEGATIVE mg/dL
NITRITE: NEGATIVE
PROTEIN: NEGATIVE mg/dL
Specific Gravity, Urine: 1.01 (ref 1.005–1.030)
pH: 7.5 (ref 5.0–8.0)

## 2016-01-21 LAB — GLUCOSE, CAPILLARY
Glucose-Capillary: 102 mg/dL — ABNORMAL HIGH (ref 65–99)
Glucose-Capillary: 131 mg/dL — ABNORMAL HIGH (ref 65–99)
Glucose-Capillary: 139 mg/dL — ABNORMAL HIGH (ref 65–99)

## 2016-01-21 LAB — URINE MICROSCOPIC-ADD ON: WBC UA: NONE SEEN WBC/hpf (ref 0–5)

## 2016-01-21 MED ORDER — LORAZEPAM 2 MG/ML IJ SOLN
1.0000 mg | Freq: Once | INTRAMUSCULAR | Status: AC
  Administered 2016-01-21: 1 mg via INTRAVENOUS
  Filled 2016-01-21: qty 1

## 2016-01-21 NOTE — Progress Notes (Signed)
Occupational Therapy Treatment Patient Details Name: TORILYN BRAD MRN: [id number] DOB: 08/18/1970 Today's Date: 01/21/2016    History of present illness Satin D Benett is a 45 yo female who was admitted with generalized weakness, difficulty walking, and thoracic pain.  PMH includes non-hodgkin's lymphoma, HSV-2, cerebellar ataxia, anxiety and depression.   OT comments  Pt making progress with functional goals. OT will continue to follow  Follow Up Recommendations  SNF;Supervision/Assistance - 24 hour    Equipment Recommendations  Other (comment) (TBD at next venue of care)    Recommendations for Other Services      Precautions / Restrictions Precautions Precautions: Fall Restrictions Weight Bearing Restrictions: No       Mobility Bed Mobility Overal bed mobility: Needs Assistance Bed Mobility: Supine to Sit;Sit to Supine     Supine to sit: Min guard Sit to supine: Min assist   General bed mobility comments: pt impulsive sitting to EOB upon entering room wiht sitter and mom. min A with LEs back onto bed  Transfers Overall transfer level: Needs assistance Equipment used: None Transfers: Sit to/from Stand Sit to Stand: Mod assist;+2 physical assistance;+2 safety/equipment;Max assist              Balance Overall balance assessment: Needs assistance   Sitting balance-Leahy Scale: Fair       Standing balance-Leahy Scale: Poor                     ADL Overall ADL's : Needs assistance/impaired     Grooming: Wash/dry hands;Wash/dry face;Maximal assistance;Moderate assistance                   Toilet Transfer: Moderate assistance;+2 for physical assistance;Ambulation;BSC   Toileting- Water quality scientist and Hygiene: Sit to/from stand;Total assistance                Vision  no change from baseline                          Cognition   Behavior During Therapy: Flat affect;Impulsive Overall Cognitive Status:  Impaired/Different from baseline Area of Impairment: Orientation;Attention;Following commands;Safety/judgement;Awareness;Problem solving Orientation Level: Disoriented to;Place;Time;Situation        Safety/Judgement: Decreased awareness of safety;Decreased awareness of deficits   Problem Solving: Slow processing;Decreased initiation;Requires verbal cues;Requires tactile cues                                General Comments  pt pleasant, impulsive    Pertinent Vitals/ Pain       Pain Assessment: No/denies pain                                                          Frequency Min 2X/week     Progress Toward Goals  OT Goals(current goals can now be found in the care plan section)  Progress towards OT goals: Progressing toward goals     Plan Discharge plan remains appropriate                     End of Session Equipment Utilized During Treatment: Gait belt;Other (comment) (BSC)   Activity Tolerance Patient tolerated treatment well   Patient Left with call bell/phone within reach;with  family/visitor present;in bed;with bed alarm set;with nursing/sitter in room             Time: 1331-1349 OT Time Calculation (min): 18 min  Charges: OT General Charges $OT Visit: 1 Procedure OT Treatments $Self Care/Home Management : 8-22 mins  Britt Bottom 01/21/2016, 3:08 PM

## 2016-01-21 NOTE — Procedures (Signed)
ELECTROENCEPHALOGRAM REPORT  Date of Study: 01/21/2016  Patient's Name: [name] MRN: [id number] Date of Birth: 03-24-1971  Referring Provider: Dr. Elson Clan  Clinical History: This is a 45 year old woman with altered mental status.  Medications: acetaminophen (TYLENOL) tablet 650 mg  ORazepam (ATIVAN) tablet 2 mg  pantoprazole (PROTONIX) EC tablet 40 mg  thiamine (VITAMIN B-1) tablet 100 mg  traMADol (ULTRAM) tablet 50 mg  Vitamin D (Ergocalciferol) (DRISDOL) capsule 50,000 Units   Technical Summary: A multichannel digital EEG recording measured by the international 10-20 system with electrodes applied with paste and impedances below 5000 ohms performed as portable with EKG monitoring in an awake and agitated/restless patient.  Hyperventilation and photic stimulation were not performed.  The digital EEG was referentially recorded, reformatted, and digitally filtered in a variety of bipolar and referential montages for optimal display.   Description: The patient is awake and agitated/restless during the recording. There is no clear posterior dominant rhythm. The background consists of a large amount of diffuse 4-7 Hz theta and 2-3 Hz delta slowing. Normal sleep architecture is not seen. Hyperventilation and photic stimulation were not performed.  There were no epileptiform discharges or electrographic seizures seen.    EKG lead was unremarkable.  Impression: This awake EEG is abnormal due to moderate diffuse slowing of the waking background.  Clinical Correlation of the above findings indicates diffuse cerebral dysfunction that is non-specific in etiology and can be seen with hypoxic/ischemic injury, toxic/metabolic encephalopathies, neurodegenerative disorders, or medication effect.  The absence of epileptiform discharges does not rule out a clinical diagnosis of epilepsy.  Clinical correlation is advised.   Ellouise Newer, M.D.

## 2016-01-21 NOTE — Progress Notes (Signed)
Pt was restless and agitated on and off throughout shift but calms down with ativan and othe antiaxiety medication and tramadol for pain.

## 2016-01-21 NOTE — Progress Notes (Signed)
EEG Completed; Results Pending  

## 2016-01-21 NOTE — Progress Notes (Signed)
Physical Therapy Treatment Patient Details Name: [name] MRN: [id number] DOB: 03/27/1971 Today's Date: 01/21/2016    History of Present Illness [name] is a 45 yo female who was admitted with generalized weakness, difficulty walking, and thoracic pain.  PMH includes non-hodgkin's lymphoma, HSV-2, cerebellar ataxia, anxiety and depression.    PT Comments    Patient continues to be lethargic with very little eye opening and verbal communication. Pt did however respond appropriately when verbally communicating and given cues. Current plan remains appropriate.   Follow Up Recommendations  SNF;Supervision/Assistance - 24 hour     Equipment Recommendations  None recommended by PT    Recommendations for Other Services       Precautions / Restrictions Precautions Precautions: Fall Restrictions Weight Bearing Restrictions: No    Mobility  Bed Mobility Overal bed mobility: Needs Assistance Bed Mobility: Supine to Sit     Supine to sit: Mod assist;Max assist     General bed mobility comments: multimodal cues for sequencing and task initiation; mod A with use of bed pad to come to sitting and max to return to supine  Transfers Overall transfer level: Needs assistance Equipment used: Rolling walker (2 wheeled);None Transfers: Sit to/from Stand Sit to Stand: Mod assist;+2 physical assistance;+2 safety/equipment;Max assist         General transfer comment: mod A +2 to stand and gain balance upon standing and max A +2 to descend safely to recliner and EOB  Ambulation/Gait Ambulation/Gait assistance: Max assist;+2 physical assistance Ambulation Distance (Feet): 20 Feet Assistive device: 2 person hand held assist (+3 for chair follow) Gait Pattern/deviations: Step-through pattern;Decreased stride length;Narrow base of support Gait velocity: slow   General Gait Details: max A +2 HHA with pt pushing through R elbow as she maintained R elbow and wrist flexion;  facilitation provided at glutes and assist to maintain balance; pt with very little eye opening   Stairs            Wheelchair Mobility    Modified Rankin (Stroke Patients Only)       Balance Overall balance assessment: Needs assistance   Sitting balance-Leahy Scale: Fair       Standing balance-Leahy Scale: Poor                      Cognition Arousal/Alertness: Lethargic;Suspect due to medications Behavior During Therapy: Flat affect (episodes of crying) Overall Cognitive Status: Impaired/Different from baseline Area of Impairment: Orientation;Attention;Following commands;Safety/judgement;Awareness;Problem solving Orientation Level: Disoriented to;Place;Time;Situation Current Attention Level: Focused   Following Commands: Follows one step commands with increased time;Follows one step commands inconsistently Safety/Judgement: Decreased awareness of safety;Decreased awareness of deficits Awareness: Intellectual Problem Solving: Slow processing;Decreased initiation;Requires verbal cues;Requires tactile cues General Comments: pt continues to be lethargic with episodes of crying and kept eyes closed most of the time    Exercises      General Comments General comments (skin integrity, edema, etc.): unable to passively bring R UE into extension however pt extended (not fully) R UE to reach out toward therapist's hand when asked; pt with very little verbal communication but when she did respond verbally was very pleasant and answered appropriately       Pertinent Vitals/Pain Pain Assessment: Faces Faces Pain Scale: No hurt    Home Living                      Prior Function            PT  Goals (current goals can now be found in the care plan section) Acute Rehab PT Goals Patient Stated Goal: none stated PT Goal Formulation: Patient unable to participate in goal setting Time For Goal Achievement: 01/25/16 Potential to Achieve Goals: Fair Progress  towards PT goals: Progressing toward goals    Frequency  Min 3X/week    PT Plan Current plan remains appropriate    Co-evaluation             End of Session Equipment Utilized During Treatment: Gait belt Activity Tolerance: Patient limited by lethargy Patient left: with nursing/sitter in room;in bed;with call bell/phone within reach;with restraints reapplied     Time: 0932-0955 PT Time Calculation (min) (ACUTE ONLY): 23 min  Charges:  $Gait Training: 8-22 mins $Therapeutic Activity: 8-22 mins                    G Codes:      Salina April, PTA Pager: [phone]   01/21/2016, 11:07 AM

## 2016-01-21 NOTE — Progress Notes (Signed)
Subjective:  [name] appears slightly less responsive than several days ago. She is a bit more agitated. Sitter is at the bedside.  Exam: Vitals:   01/21/16 0054 01/21/16 0728  BP: (!) 158/121 117/61  Pulse: (!) 116 74  Resp: 20 18  Temp: (!) 100.8 F (38.2 C) (!) 100.5 F (38.1 C)    HEENT-  Normocephalic, no lesions, without obvious abnormality.  Normal external eye and conjunctiva.  Normal TM's bilaterally.  Normal auditory canals and external ears. Normal external nose, mucus membranes and septum.  Normal pharynx. Cardiovascular- regular rate and rhythm, S1, S2 normal, no murmur, click, rub or gallop, pulses palpable throughout   Lungs- chest clear, no wheezing, rales, normal symmetric air entry, Heart exam - S1, S2 normal, no murmur, no gallop, rate regular Abdomen- soft, non-tender; bowel sounds normal; no masses,  no organomegaly Extremities- less then 2 second capillary refill  Gen: In bed, NAD MS: Follows a few simple commands. Limited speech output. CN: Pupils are equal and reactive. There is no facial asymmetry. Motor: Antigravity in all 4 extremities. Sensory: Grossly intact. DTR: 1-2+ bilaterally.  Pertinent Labs/Diagnostics: Reviewed.    Impression:   [name] appears to have experienced a gradual and slight decline in her cognitive function over the past few days. Additional studies have been requested. We will also request EEG to rule out subclinical seizure activity. Her temperature is mildly elevated today. We will recheck her CBC and a urinalysis today.   Recommendations:   1. Will check EEG today.  James A. Tasia Catchings, M.D. Neurohospitalist Phone: [phone]   01/21/2016, 7:47 AM

## 2016-01-21 NOTE — Progress Notes (Signed)
Patient ID: [name], female   DOB: August 29, 1970, 45 y.o.   MRN: [id number]                                                                PROGRESS NOTE                                                                                                                                                                                                             Patient Demographics:    [name], is a 45 y.o. female, DOB - [birth year], DSK:[id number]  Admit date - 01/12/2016   Admitting Physician Theodis Blaze, MD  Outpatient Primary MD for the patient is Tammi Sou, MD  LOS - 8  Outpatient Specialists: Ulyses Jarred (neurologist)  Chief Complaint  Patient presents with  . Weakness       Brief Narrative  45 y.o. female, w difficulty with walking as well as generalized weakness x 4 weeks pt has been having increase in falls. MRI brain 8/18 => Interval progression of white matter signal abnormality, greatest in the pons, compared to the study 1 month earlier. 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    Subjective:    [name] today with  No headache, No chest pain, Complaints of lower back pain, episode of irritation failure on the day where she pulled her IV access.  Assessment  & Plan :    Principal Problem:   Weakness Active Problems:   Ataxia   Gait abnormality   Ataxia/Gait problems with subacute progressive cerebral dysfunction - MRI brain from 01/14/16 shows abnormal T2 signal in the pons . 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 Paraneoplastic panel still pending, spoke with labs,  send out  to [facility], earliest would be 8/26. 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 - Ammonia, B-12 within normal limits - Even though low probability ,  will check CJ virus PCR to rule out PML, discussed with lab, it was added to spinal fluid from spinal tap on 8/17 . - Neurology following, a plan for repeat EEG today  H/o recurrent follicular continuous lymphoma (non-Hodgkin's) - last chemotherapy completed  05/2016 - Neurology input greatly appreciated, CT scans with no evidence of lymphoma recurrence, PET scan 2 months with no evidence of recurrence, LP with no evidence of malignant cells , bone marrow biopsy 8/22 discuss results today with Dr. Marin Olp, no evidence of malignancy  Hypokalemia - Repleted  Fever - Patient with fever this morning, no leukocytosis, chest x-ray with no evidence of infiltrate, urinalysis pending  Code Status : Full  Family communication: D/W mother at bedside 8/24  Disposition Plan  :  addition for SNF placement, but not ready for discharge yet, pending further workup  Consults  :  Oncology/neurology  DVT Prophylaxis  :  Heparin   Lab Results  Component Value Date   PLT 214 01/21/2016    Antibiotics  :   Anti-infectives    None        Objective:   Vitals:   01/20/16 1434 01/20/16 2035 01/21/16 0054 01/21/16 0728  BP: (!) 120/57 (!) 104/47 (!) 158/121 117/61  Pulse: 86 (!) 123 (!) 116 74  Resp: '20 20 20 18  '$ Temp: 98.2 F (36.8 C) 99 F (37.2 C) (!) 100.8 F (38.2 C) (!) 100.5 F (38.1 C)  TempSrc: Axillary Axillary Axillary Oral  SpO2: 97% 93% 98% 96%  Weight:      Height:        Wt Readings from Last 3 Encounters:  01/18/16 51.2 kg (112 lb 12.8 oz)  12/30/15 48.9 kg (107 lb 12.8 oz)  12/27/15 48.8 kg (107 lb 8 oz)     Intake/Output Summary (Last 24 hours) at 01/21/16 1130 Last data filed at 01/21/16 0900  Gross per  24 hour  Intake              890 ml  Output                0 ml  Net              890 ml     Physical Exam  Sleepy,Remains lethargic and confused today. Supple Neck,No JV Symmetrical Chest wall movement, Good air movement bilaterally, CTAB RRR,No Gallops,Rubs or new Murmurs, No Parasternal Heave +ve B.Sounds, Abd Soft, No tenderness, No rebound - guarding or rigidity. No Cyanosis, Clubbing or edema, No new Rash or bruise  , poor coordination, dysmetria.    Data Review:    CBC  Recent Labs Lab 01/18/16 0704 01/21/16 0907  WBC 6.7 6.3  HGB 11.8* 11.0*  HCT 35.9* 32.8*  PLT 251 214  MCV 91.6 88.9  MCH 30.1 29.8  MCHC 32.9 33.5  RDW 13.3 13.2  LYMPHSABS 0.6* 0.7  MONOABS 0.6 0.7  EOSABS 0.0 0.0  BASOSABS 0.0 0.0    Chemistries   Recent Labs Lab 01/19/16 0902 01/20/16 0545  NA 133* 132*  K 3.2* 3.6  CL 102 100*  CO2 25 25  GLUCOSE 108* 127*  BUN 12 12  CREATININE 0.66 0.54  CALCIUM 9.4 9.2   ------------------------------------------------------------------------------------------------------------------ No results for input(s): CHOL, HDL, LDLCALC, TRIG, CHOLHDL, LDLDIRECT in the last 72 hours.  No results found for: HGBA1C ------------------------------------------------------------------------------------------------------------------ No results for input(s): TSH, T4TOTAL, T3FREE, THYROIDAB in the last 72 hours.  Invalid input(s): FREET3 ------------------------------------------------------------------------------------------------------------------ No results for input(s): VITAMINB12, FOLATE,  FERRITIN, TIBC, IRON, RETICCTPCT in the last 72 hours.  Coagulation profile  Recent Labs Lab 01/18/16 0704  INR 1.09    No results for input(s): DDIMER in the last 72 hours.  Cardiac Enzymes No results for input(s): CKMB, TROPONINI, MYOGLOBIN in the last 168 hours.  Invalid input(s):  CK ------------------------------------------------------------------------------------------------------------------ No results found for: BNP  Inpatient Medications  Scheduled Meds: . heparin subcutaneous  5,000 Units Subcutaneous Q8H  . pantoprazole  40 mg Oral Daily  . polyethylene glycol  17 g Oral Daily  . potassium chloride SA  20 mEq Oral Daily  . potassium chloride  40 mEq Oral Once  . sodium chloride flush  3 mL Intravenous Q12H  . thiamine  100 mg Oral Daily  . Vitamin D (Ergocalciferol)  50,000 Units Oral Weekly   Continuous Infusions: . dextrose 5 % and 0.9% NaCl 75 mL/hr (01/21/16 0804)  . lactated ringers 10 mL/hr at 01/14/16 1538   PRN Meds:.acetaminophen **OR** acetaminophen, bisacodyl, LORazepam, mineral oil, traMADol  Micro Results No results found for this or any previous visit (from the past 240 hour(s)).  Radiology Reports Ct Head Wo Contrast  Result Date: 01/13/2016 CLINICAL DATA:  45 y/o F; increased weakness and difficulty with speech mobility over the last 3 weeks. History of non-Hodgkin's lymphoma. EXAM: CT HEAD WITHOUT CONTRAST TECHNIQUE: Contiguous axial images were obtained from the base of the skull through the vertex without intravenous contrast. COMPARISON:  MRI of the brain dated 12/20/2015. FINDINGS: Brain: No evidence of acute infarction, hemorrhage, hydrocephalus, extra-axial collection or mass lesion/mass effect. Moderate generalized atrophy for patient's age. Few lucencies in white matter and within the pons corresponding FLAIR signal abnormality on prior MRI and are nonspecific. Vascular: No hyperdense vessel or unexpected calcification. Skull: No fracture is identified.  No suspicious lesion. Sinuses/Orbits: No acute finding. Other: No unexpected finding. IMPRESSION: No acute intracranial abnormality is identified. Stable generalized volume loss and mild nonspecific white matter lucencies possibly representing microvascular disease or posttreatment  changes. Electronically Signed   By: Kristine Garbe M.D.   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 Staging. Subsequent encounter. EXAM: CT NECK WITH CONTRAST TECHNIQUE: Multidetector CT imaging of the neck was performed using the standard protocol following the bolus administration of intravenous contrast. CONTRAST:  150m ISOVUE-300 IOPAMIDOL (ISOVUE-300) INJECTION 61% in conjunction with contrast enhanced imaging of the chest, abdomen, and pelvis reported separately. COMPARISON:  Brain MRI 01/14/2016. Head CT without contrast 01/13/2016. PET-CT 11/03/2015. FINDINGS: Pharynx and larynx: Larynx is normal in best seen on series 6, image 70. Pharyngeal soft tissue contours are within normal limits. Negative parapharyngeal and retropharyngeal spaces. Salivary glands: Negative sublingual space, sublingual glands, submandibular glands, and parotid glands. Thyroid: Negative. Lymph nodes: No cervical lymphadenopathy. Only diminutive (4 mm short axis or smaller) bilateral lymph nodes are identified. Vascular: Major vascular structures in the neck and at skullbase are patent and appear normal. Limited intracranial: Stable from yesterday brain MRI. Visualized orbits: Negative. Mastoids and visualized paranasal sinuses: Visualized paranasal sinuses and mastoids are stable and well pneumatized. Skeleton: No osseous abnormality identified. Upper chest: Chest CT is reported separately today. IMPRESSION: 1. Negative neck CT.  No evidence of lymphoma in the neck. 2. CT chest abdomen and pelvis today reported separately. Electronically Signed   By: HGenevie AnnM.D.   On: 01/15/2016 18:23   Ct Chest W Contrast  Result Date: 01/15/2016  CLINICAL DATA:  Recurrent lymphoma. EXAM: CT CHEST, ABDOMEN, AND PELVIS WITH CONTRAST TECHNIQUE: Multidetector CT imaging of the chest, abdomen and pelvis was performed  following the standard protocol during bolus administration of intravenous contrast. CONTRAST:  ISOVUE-300 IOPAMIDOL (ISOVUE-300) INJECTION 61% COMPARISON:  None. FINDINGS: CT CHEST FINDINGS Cardiovascular: The heart and great vessels are normal. Mediastinum/Nodes: Normal.  No adenopathy. Lungs/Pleura: Minimal linear opacity anteriorly in the right lung on series 3, image 61 is stable and of doubtful significance. This may represent a tiny region of scar. The lungs are otherwise normal. Musculoskeletal: Normal. CT ABDOMEN PELVIS FINDINGS Hepatobiliary: Normal. Pancreas: Normal. Spleen: Normal. Adrenals/Urinary Tract: Normal. Stomach/Bowel: The stomach and small bowel are normal. Moderate fecal loading is seen throughout the colon. The colon is otherwise normal. The cecum is seen in the right side of the pelvis. Visualized portions of the appendix are normal. Vascular/Lymphatic: No aneurysm or dissection.  No adenopathy. Reproductive: The patient is status post tubal ligation. The uterus and adnexae are normal. Other: No other abnormalities. Musculoskeletal: No acute bony abnormalities. IMPRESSION: 1. No evidence of lymphoma recurrence in the chest, abdomen, or pelvis. 2. Moderate fecal loading throughout the colon. 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 Recent PET scan was negative for disease. Recent onset of difficulty walking and speech issues. 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 EXAM: MRI HEAD WITHOUT AND WITH CONTRAST TECHNIQUE: Multiplanar, multiecho pulse sequences of the brain and surrounding structures were obtained without and with intravenous contrast. CONTRAST:  41mL MULTIHANCE GADOBENATE DIMEGLUMINE 529 MG/ML IV SOLN COMPARISON:  12/20/2015. FINDINGS: No acute stroke or hemorrhage.  No mass  lesion or extra-axial fluid. Global atrophy, hydrocephalus ex vacuo. Significant premature brain substance loss affecting the cerebral hemispheres and cerebellum. Premature for age subcortical greater than periventricular white matter signal abnormality, also involving the pons. No definite involvement of the cerebellar white matter. There is moderate interval progression of the T2 and FLAIR hyperintensity in the pons, and to a lesser degree, progression in the subcortical white matter, compared with the study 1 month earlier. These show no restriction, susceptibility, or postcontrast enhancement. Partial empty sella. Flow voids are maintained. Extracranial soft tissues are unremarkable. Post infusion imaging through the entire head demonstrates no abnormal enhancement of brain or meninges. IMPRESSION: Interval progression of white matter signal abnormality, greatest in the pons, compared to the study 1 month earlier. These white matter lesions show no restriction, susceptibility, or postcontrast enhancement. 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 Global atrophy affecting the cerebral hemispheres, brainstem, and cerebellum, stable from priors. No areas of restricted diffusion or postcontrast enhancement. Electronically Signed   By: Elsie Stain M.D.   On: 01/14/2016 18:37   Ct Abdomen Pelvis W Contrast  Result Date: 01/15/2016 CLINICAL DATA:  Recurrent lymphoma. EXAM: CT CHEST, ABDOMEN, AND PELVIS WITH CONTRAST TECHNIQUE: Multidetector CT imaging of the chest, abdomen and pelvis was performed following the standard protocol during bolus administration of intravenous contrast. CONTRAST:  ISOVUE-300 IOPAMIDOL (ISOVUE-300) INJECTION 61% COMPARISON:  None. FINDINGS: CT CHEST FINDINGS Cardiovascular: The heart and great vessels are normal. Mediastinum/Nodes: Normal.  No adenopathy. Lungs/Pleura: Minimal linear opacity anteriorly in the right lung on series 3,  image 61 is stable and of doubtful significance. This may represent a tiny region of scar. The lungs are otherwise normal.  Musculoskeletal: Normal. CT ABDOMEN PELVIS FINDINGS Hepatobiliary: Normal. Pancreas: Normal. Spleen: Normal. Adrenals/Urinary Tract: Normal. Stomach/Bowel: The stomach and small bowel are normal. Moderate fecal loading is seen throughout the colon. The colon is otherwise normal. The cecum is seen in the right side of the pelvis. Visualized portions of the appendix are normal. Vascular/Lymphatic: No aneurysm or dissection.  No adenopathy. Reproductive: The patient is status post tubal ligation. The uterus and adnexae are normal. Other: No other abnormalities. Musculoskeletal: No acute bony abnormalities. IMPRESSION: 1. No evidence of lymphoma recurrence in the chest, abdomen, or pelvis. 2. Moderate fecal loading throughout the colon. Electronically Signed   By: Dorise Bullion III M.D   On: 01/15/2016 18:30   Ct Biopsy  Result Date: 01/18/2016 CLINICAL DATA:  History of cutaneous lymphoma. Bone marrow biopsy requested to assess for any evidence of lymphoma recurrence. EXAM: CT GUIDED BONE MARROW ASPIRATION AND CORE BIOPSY ANESTHESIA/SEDATION: Versed 2.0 mg IV, Fentanyl 100 mcg IV Total Moderate Sedation Time 20 minutes. The patient's level of consciousness and physiologic status were continuously monitored during the procedure by Radiology nursing. PROCEDURE: The procedure risks, benefits, and alternatives were explained to the patient's mother. Questions regarding the procedure were encouraged and answered. The patient's mother understands and consents to the procedure. A time-out was performed prior to the procedure. The right gluteal region was prepped with Betadine. Sterile gown and sterile gloves were used for the procedure. Local anesthesia was provided with 1% Lidocaine. Under CT guidance, an 11 gauge OnControl bone cutting needle was advanced from a posterior approach into the right  iliac bone. Needle positioning was confirmed with CT. Initial non heparinized and heparinized aspirate samples were obtained of bone marrow. Core biopsy was performed with the 11 gauge needle. COMPLICATIONS: None FINDINGS: Inspection of initial aspirate did reveal visible particles. Intact core biopsy sample was obtained. IMPRESSION: CT guided bone marrow biopsy of right posterior iliac bone with both aspirate and core samples obtained. Electronically Signed   By: Aletta Edouard M.D.   On: 01/18/2016 11:07   Dg Chest Port 1 View  Result Date: 01/21/2016 CLINICAL DATA:  Fever, confusion, history of cutaneous non-Hodgkin's lymphoma EXAM: PORTABLE CHEST 1 VIEW COMPARISON:  CT scan of the chest of January 15, 2016 FINDINGS: The lungs are adequately inflated. There is no focal infiltrate. There is no pleural effusion. The heart and pulmonary vascularity are normal. The mediastinum is normal in width. The bony thorax is unremarkable. IMPRESSION: There is no active cardiopulmonary disease. Electronically Signed   By: David  Martinique M.D.   On: 01/21/2016 08:48   Dg Hip Unilat With Pelvis 2-3 Views Left  Result Date: 01/13/2016 CLINICAL DATA:  45 y/o  F; status post fall with anterior hip pain. EXAM: DG HIP (WITH OR WITHOUT PELVIS) 2-3V LEFT COMPARISON:  None. FINDINGS: There is no evidence of hip fracture or dislocation. There is no evidence of arthropathy or other focal bone abnormality. Clips project over the pelvis. IMPRESSION: No acute fracture or dislocation is identified. Electronically Signed   By: Kristine Garbe M.D.   On: 01/13/2016 02:07      ELGERGAWY, DAWOOD M.D on 01/21/2016 at 11:30 AM  Between 7am to 7pm - Pager - [phone]  After 7pm go to [email or web address] - password [facility]  [facility] -  Office  [phone]

## 2016-01-21 NOTE — Progress Notes (Signed)
Pt Temp 100.2; prn suppository tylenol administered. Pt in bed with call light and family at bedside including Air cabin crew. Cold wash cloth also applied to pt forehead. Reported off to oncoming RN. Delia Heady RN

## 2016-01-21 NOTE — Clinical Social Work Note (Signed)
CSW spoke with [place] and [name] has been obtained, which will be good through Monday. At that point, updated PT/OT notes will be needed. CSW also updated that Charge RN that the pt is to be sitter and mitten free for 24 hours prior to discharge. CSW will continue to follow.   Darden Dates, MSW, LCSW Clinical Social Worker  [phone]

## 2016-01-21 NOTE — Progress Notes (Signed)
Mrs. Virgo is really no better. She really is not responding to me. She has a lot of confusion. She still has the protective gloves on. She is not eating. It seems as if neurologically, she just is not coming around. She did have the IVIG.  Her bone marrow biopsy did not show any evidence of lymphoma. Flow cytometry was negative for any monoclonal population of lymphocytes. As such, there is no evidence of recurrent lymphoma to account for the neurologic decline.  I still have to worry about the possibility of PML. I have not found anything in the literature about delayed onset of PML in patients who have had Gazyva. Again, her last Gazyva dose was back in January. I know that that with Rituxan, which is similar to [facility], there can be the onset of PML for a year after being treated.  Again, I think the only way we would ever prove PML is either a brain biopsy or possibly repeat lumbar puncture and send the fluid off for JCV- pcr. This might be something to think about.  If we do find that she has PML, I'm not sure there is much that we can to regarding this. She certainly is not immunosuppressed. She currently is on no medications that would cause it. I there are anecdotal reports of various interventions that have had success.  She has had a little low-grade temperature. Her blood pressure is on the higher side. She is somewhat tachycardic. I cannot find anything on her exam that would be focal.  I really, really feel horrible for Mrs. Ricard Dillon. I spent a very stressful year with her trying to help her husband who passed away from metastatic esophageal cancer. I do not know if this has somehow played a role.  I very much appreciate all the wonderful care that she is getting from all the staff upon [facility]. You are doing a great job with her.  Lattie Haw, MD  Exodus 23:25

## 2016-01-22 DIAGNOSIS — R32 Unspecified urinary incontinence: Secondary | ICD-10-CM

## 2016-01-22 LAB — COMPREHENSIVE METABOLIC PANEL
ALK PHOS: 63 U/L (ref 38–126)
ALT: 110 U/L — AB (ref 14–54)
AST: 67 U/L — AB (ref 15–41)
Albumin: 3.4 g/dL — ABNORMAL LOW (ref 3.5–5.0)
Anion gap: 6 (ref 5–15)
BILIRUBIN TOTAL: 0.7 mg/dL (ref 0.3–1.2)
CALCIUM: 9 mg/dL (ref 8.9–10.3)
CO2: 25 mmol/L (ref 22–32)
CREATININE: 0.61 mg/dL (ref 0.44–1.00)
Chloride: 103 mmol/L (ref 101–111)
GFR calc Af Amer: >60 mL/min (ref >60)
Glucose, Bld: 144 mg/dL — ABNORMAL HIGH (ref 65–99)
Potassium: 2.9 mmol/L — ABNORMAL LOW (ref 3.5–5.1)
Sodium: 134 mmol/L — ABNORMAL LOW (ref 135–145)
TOTAL PROTEIN: 7.4 g/dL (ref 6.5–8.1)

## 2016-01-22 LAB — CBC
HEMATOCRIT: 35.4 % — AB (ref 36.0–46.0)
Hemoglobin: 11.6 g/dL — ABNORMAL LOW (ref 12.0–15.0)
MCH: 29.4 pg (ref 26.0–34.0)
MCHC: 32.8 g/dL (ref 30.0–36.0)
MCV: 89.6 fL (ref 78.0–100.0)
PLATELETS: 220 10*3/uL (ref 150–400)
RBC: 3.95 MIL/uL (ref 3.87–5.11)
RDW: 12.9 % (ref 11.5–15.5)
WBC: 6.1 10*3/uL (ref 4.0–10.5)

## 2016-01-22 LAB — BASIC METABOLIC PANEL
Anion gap: 8 (ref 5–15)
BUN: <5 mg/dL — ABNORMAL LOW (ref 6–20)
CALCIUM: 9.1 mg/dL (ref 8.9–10.3)
CO2: 27 mmol/L (ref 22–32)
CREATININE: 0.55 mg/dL (ref 0.44–1.00)
Chloride: 100 mmol/L — ABNORMAL LOW (ref 101–111)
GFR calc Af Amer: >60 mL/min (ref >60)
GLUCOSE: 116 mg/dL — AB (ref 65–99)
Potassium: 2.8 mmol/L — ABNORMAL LOW (ref 3.5–5.1)
Sodium: 135 mmol/L (ref 135–145)

## 2016-01-22 LAB — GLUCOSE, CAPILLARY
GLUCOSE-CAPILLARY: 117 mg/dL — AB (ref 65–99)
Glucose-Capillary: 104 mg/dL — ABNORMAL HIGH (ref 65–99)
Glucose-Capillary: 115 mg/dL — ABNORMAL HIGH (ref 65–99)

## 2016-01-22 LAB — MAGNESIUM: MAGNESIUM: 2 mg/dL (ref 1.7–2.4)

## 2016-01-22 LAB — HIV ANTIBODY (ROUTINE TESTING W REFLEX): HIV SCREEN 4TH GENERATION: NONREACTIVE

## 2016-01-22 MED ORDER — POTASSIUM CHLORIDE CRYS ER 20 MEQ PO TBCR
40.0000 meq | EXTENDED_RELEASE_TABLET | ORAL | Status: AC
Start: 2016-01-22 — End: 2016-01-22
  Administered 2016-01-22 (×2): 40 meq via ORAL
  Filled 2016-01-22 (×2): qty 2

## 2016-01-22 MED ORDER — CYANOCOBALAMIN 1000 MCG/ML IJ SOLN
1000.0000 ug | Freq: Once | INTRAMUSCULAR | Status: AC
  Administered 2016-01-22: 1000 ug via INTRAMUSCULAR
  Filled 2016-01-22: qty 1

## 2016-01-22 NOTE — Progress Notes (Signed)
Patient ID: [name], female   DOB: January 12, 1971, 45 y.o.   MRN: [id number]                                                                PROGRESS NOTE                                                                                                                                                                                                             Patient Demographics:    [name], is a 45 y.o. female, DOB - 1970/09/08, WUJ:[id number]  Admit date - 01/12/2016   Admitting Physician Theodis Blaze, MD  Outpatient Primary MD for the patient is Tammi Sou, MD  LOS - 9  Outpatient Specialists: Ulyses Jarred (neurologist)  Chief Complaint  Patient presents with  . Weakness       Brief Narrative  45 y.o. female, w difficulty with walking as well as generalized weakness x 4 weeks pt has been having increase in falls. MRI brain 8/18 => Interval progression of white matter signal abnormality, greatest in the pons, compared to the study 1 month earlier. 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    Subjective:    [name] today with no headache, No chest pain, she has intermittently been agitated. Mother at bedside, says she recognized and conversed with friends this AM. Right now she is a bit tearful and agitated by questions. Denies any pain or nausea.   Assessment  & Plan :    Principal Problem:   Weakness Active Problems:   Ataxia   Gait abnormality   Fever  Ataxia/Gait problems with subacute progressive cerebral dysfunction - MRI brain from 01/14/16 shows abnormal T2 signal in the pons . 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  Paraneoplastic panel still pending, send out to [facility]. 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 - Neurology following, repeat EEG nonspecific  H/o recurrent follicular continuous lymphoma (non-Hodgkin's) - last chemotherapy completed  05/2016 - Neurology input greatly appreciated, CT scans with no evidence of lymphoma recurrence, PET scan 2 months with no evidence of recurrence, LP with no evidence of malignant cells , bone marrow biopsy 8/22 per Dr. Marin Olp, no evidence of malignancy  Hypokalemia - Repleted  Fever - Patient with continued low grade fever this morning, no leukocytosis, chest x-ray with no evidence of infiltrate, urinalysis unremarkable as well  Code Status : Full  Family communication: D/W mother at bedside this AM  Disposition Plan : Likely will need SNF placement, but not ready for discharge yet, pending further workup  Consults  :  Oncology/neurology  DVT Prophylaxis  :  Heparin   Lab Results  Component Value Date   PLT 220 01/22/2016    Antibiotics  :   Anti-infectives    None        Objective:   Vitals:   01/22/16 0128 01/22/16 0500 01/22/16 0656 01/22/16 0953  BP: 110/62 (!) 88/75  123/64  Pulse: 94   82  Resp: '20 18  18  '$ Temp: 98.8 F (37.1 C) 100.2 F (37.9 C) 98.7 F (37.1 C) 99 F (37.2 C)  TempSrc: Oral Oral  Oral  SpO2: 98% 97%  100%  Weight:  48.4 kg (106 lb 12.8 oz)    Height:        Wt Readings from Last 3 Encounters:  01/22/16 48.4 kg (106 lb 12.8 oz)  12/30/15 48.9 kg (107 lb 12.8 oz)  12/27/15 48.8 kg (107 lb 8 oz)     Intake/Output Summary (Last 24 hours) at 01/22/16 1411 Last data filed at 01/22/16 9528  Gross per 24 hour  Intake              773  ml  Output             2000 ml  Net            -1227 ml     Physical Exam  Awake, but not oriented, she becomes agitated and tearful with questions today. Supple Neck,No JV Symmetrical Chest wall movement, Good air movement bilaterally, CTAB RRR,No Gallops,Rubs or new Murmurs, No Parasternal Heave +ve B.Sounds, Abd Soft, No tenderness, No rebound - guarding or rigidity. No Cyanosis, Clubbing or edema, No new Rash or bruise  , poor coordination, dysmetria.    Data Review:    CBC  Recent Labs Lab 01/18/16 0704 01/21/16 0907 01/22/16 0515  WBC 6.7 6.3 6.1  HGB 11.8* 11.0* 11.6*  HCT 35.9* 32.8* 35.4*  PLT 251 214 220  MCV 91.6 88.9 89.6  MCH 30.1 29.8 29.4  MCHC 32.9 33.5 32.8  RDW 13.3 13.2 12.9  LYMPHSABS 0.6* 0.7  --   MONOABS 0.6 0.7  --   EOSABS 0.0 0.0  --   BASOSABS 0.0 0.0  --     Chemistries   Recent Labs Lab 01/19/16 0902 01/20/16 0545 01/22/16 0515 01/22/16 0909  NA 133* 132* 135 134*  K 3.2* 3.6 2.8* 2.9*  CL 102 100* 100* 103  CO2 '25 25 27 25  '$ GLUCOSE 108* 127* 116* 144*  BUN 12 12 <5* <5*  CREATININE 0.66 0.54 0.55 0.61  CALCIUM 9.4 9.2 9.1 9.0  MG  --   --   --  2.0  AST  --   --   --  67*  ALT  --   --   --  110*  ALKPHOS  --   --   --  63  BILITOT  --   --   --  0.7   ------------------------------------------------------------------------------------------------------------------ No results for input(s): CHOL, HDL, LDLCALC, TRIG, CHOLHDL, LDLDIRECT in the last 72 hours.  No results found for: HGBA1C ------------------------------------------------------------------------------------------------------------------ No results for input(s): TSH, T4TOTAL, T3FREE, THYROIDAB in the last 72 hours.  Invalid input(s): FREET3 ------------------------------------------------------------------------------------------------------------------ No results for input(s): VITAMINB12, FOLATE, FERRITIN, TIBC, IRON, RETICCTPCT in the last 72  hours.  Coagulation profile  Recent Labs Lab 01/18/16 0704  INR 1.09    No results for input(s): DDIMER in the last 72 hours.  Cardiac Enzymes No results for input(s): CKMB, TROPONINI, MYOGLOBIN in the last 168 hours.  Invalid input(s): CK ------------------------------------------------------------------------------------------------------------------ No results found for: BNP  Inpatient Medications  Scheduled Meds: . heparin subcutaneous  5,000 Units Subcutaneous Q8H  . pantoprazole  40 mg Oral Daily  . polyethylene glycol  17 g Oral Daily  . potassium chloride SA  20 mEq Oral Daily  . potassium chloride  40 mEq Oral Q4H  . sodium chloride flush  3 mL Intravenous Q12H  . thiamine  100 mg Oral Daily  . Vitamin D (Ergocalciferol)  50,000 Units Oral Weekly   Continuous Infusions: . dextrose 5 % and 0.9% NaCl 75 mL/hr at 01/22/16 1308  . lactated ringers 10 mL/hr at 01/14/16 1538   PRN Meds:.acetaminophen **OR** acetaminophen, bisacodyl, LORazepam, mineral oil, traMADol  Micro Results No results found for this or any previous visit (from the past 240 hour(s)).  Radiology Reports Ct Head Wo Contrast  Result Date: 01/13/2016 CLINICAL DATA:  45 y/o F; increased weakness and difficulty with speech mobility over the last 3 weeks. History of non-Hodgkin's lymphoma. EXAM: CT HEAD WITHOUT CONTRAST TECHNIQUE: Contiguous axial images were obtained from the base of the skull through the vertex without intravenous contrast. COMPARISON:  MRI of the brain dated 12/20/2015. FINDINGS: Brain: No evidence of acute infarction, hemorrhage, hydrocephalus, extra-axial collection or mass lesion/mass effect. Moderate generalized atrophy for patient's age. Few lucencies in white matter and within the pons corresponding FLAIR signal abnormality on prior MRI and are nonspecific. Vascular: No hyperdense vessel or unexpected calcification. Skull: No fracture is identified.  No suspicious lesion.  Sinuses/Orbits: No acute finding. Other: No unexpected finding. IMPRESSION: No acute intracranial abnormality is identified. Stable generalized volume loss and mild nonspecific white matter lucencies possibly representing microvascular disease or posttreatment changes. Electronically Signed   By: Kristine Garbe M.D.   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 Staging. Subsequent encounter. EXAM: CT NECK WITH CONTRAST TECHNIQUE: Multidetector CT imaging of the neck was performed using the standard protocol following the bolus administration of intravenous contrast. CONTRAST:  152m ISOVUE-300 IOPAMIDOL (ISOVUE-300) INJECTION 61% in conjunction with contrast enhanced imaging of the chest, abdomen, and pelvis reported separately. COMPARISON:  Brain MRI 01/14/2016. Head CT without contrast 01/13/2016. PET-CT 11/03/2015. FINDINGS: Pharynx and larynx: Larynx is normal in best seen on series 6, image 70. Pharyngeal soft tissue contours are within normal limits. Negative parapharyngeal and retropharyngeal spaces. Salivary glands: Negative sublingual space, sublingual glands, submandibular glands, and parotid glands. Thyroid: Negative. Lymph nodes: No cervical lymphadenopathy. Only diminutive (4 mm short axis or smaller) bilateral lymph nodes are identified. Vascular: Major vascular structures in the neck and at skullbase are patent  and appear normal. Limited intracranial: Stable from yesterday brain MRI. Visualized orbits: Negative. Mastoids and visualized paranasal sinuses: Visualized paranasal sinuses and mastoids are stable and well pneumatized. Skeleton: No osseous abnormality identified. Upper chest: Chest CT is reported separately today. IMPRESSION: 1. Negative neck CT.  No evidence of lymphoma in the neck. 2. CT chest abdomen and pelvis today reported separately. Electronically Signed    By: Odessa Fleming M.D.   On: 01/15/2016 18:23   Ct Chest W Contrast  Result Date: 01/15/2016 CLINICAL DATA:  Recurrent lymphoma. EXAM: CT CHEST, ABDOMEN, AND PELVIS WITH CONTRAST TECHNIQUE: Multidetector CT imaging of the chest, abdomen and pelvis was performed following the standard protocol during bolus administration of intravenous contrast. CONTRAST:  ISOVUE-300 IOPAMIDOL (ISOVUE-300) INJECTION 61% COMPARISON:  None. FINDINGS: CT CHEST FINDINGS Cardiovascular: The heart and great vessels are normal. Mediastinum/Nodes: Normal.  No adenopathy. Lungs/Pleura: Minimal linear opacity anteriorly in the right lung on series 3, image 61 is stable and of doubtful significance. This may represent a tiny region of scar. The lungs are otherwise normal. Musculoskeletal: Normal. CT ABDOMEN PELVIS FINDINGS Hepatobiliary: Normal. Pancreas: Normal. Spleen: Normal. Adrenals/Urinary Tract: Normal. Stomach/Bowel: The stomach and small bowel are normal. Moderate fecal loading is seen throughout the colon. The colon is otherwise normal. The cecum is seen in the right side of the pelvis. Visualized portions of the appendix are normal. Vascular/Lymphatic: No aneurysm or dissection.  No adenopathy. Reproductive: The patient is status post tubal ligation. The uterus and adnexae are normal. Other: No other abnormalities. Musculoskeletal: No acute bony abnormalities. IMPRESSION: 1. No evidence of lymphoma recurrence in the chest, abdomen, or pelvis. 2. Moderate fecal loading throughout the colon. 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 Recent PET scan was negative for disease. Recent onset of difficulty walking and speech issues. 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 EXAM: MRI HEAD WITHOUT AND WITH CONTRAST TECHNIQUE: Multiplanar,  multiecho pulse sequences of the brain and surrounding structures were obtained without and with intravenous contrast. CONTRAST:  10mL MULTIHANCE GADOBENATE DIMEGLUMINE 529 MG/ML IV SOLN COMPARISON:  12/20/2015. FINDINGS: No acute stroke or hemorrhage.  No mass lesion or extra-axial fluid. Global atrophy, hydrocephalus ex vacuo. Significant premature brain substance loss affecting the cerebral hemispheres and cerebellum. Premature for age subcortical greater than periventricular white matter signal abnormality, also involving the pons. No definite involvement of the cerebellar white matter. There is moderate interval progression of the T2 and FLAIR hyperintensity in the pons, and to a lesser degree, progression in the subcortical white matter, compared with the study 1 month earlier. These show no restriction, susceptibility, or postcontrast enhancement. Partial empty sella. Flow voids are maintained. Extracranial soft tissues are unremarkable. Post infusion imaging through the entire head demonstrates no abnormal enhancement of brain or meninges. IMPRESSION: Interval progression of white matter signal abnormality, greatest in the pons, compared to the study 1 month earlier. These white matter lesions show no restriction, susceptibility, or postcontrast enhancement. 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 Global atrophy affecting the cerebral hemispheres, brainstem, and cerebellum, stable from priors. No areas of restricted diffusion or postcontrast enhancement. Electronically Signed   By: Elsie Stain M.D.   On: 01/14/2016 18:37   Ct Abdomen Pelvis W Contrast  Result Date: 01/15/2016 CLINICAL DATA:  Recurrent lymphoma. EXAM: CT CHEST, ABDOMEN, AND  PELVIS WITH CONTRAST TECHNIQUE: Multidetector CT imaging of the chest, abdomen and pelvis was performed following the standard protocol during bolus administration of intravenous contrast. CONTRAST:  149m ISOVUE-300  IOPAMIDOL (ISOVUE-300) INJECTION 61% COMPARISON:  None. FINDINGS: CT CHEST FINDINGS Cardiovascular: The heart and great vessels are normal. Mediastinum/Nodes: Normal.  No adenopathy. Lungs/Pleura: Minimal linear opacity anteriorly in the right lung on series 3, image 61 is stable and of doubtful significance. This may represent a tiny region of scar. The lungs are otherwise normal. Musculoskeletal: Normal. CT ABDOMEN PELVIS FINDINGS Hepatobiliary: Normal. Pancreas: Normal. Spleen: Normal. Adrenals/Urinary Tract: Normal. Stomach/Bowel: The stomach and small bowel are normal. Moderate fecal loading is seen throughout the colon. The colon is otherwise normal. The cecum is seen in the right side of the pelvis. Visualized portions of the appendix are normal. Vascular/Lymphatic: No aneurysm or dissection.  No adenopathy. Reproductive: The patient is status post tubal ligation. The uterus and adnexae are normal. Other: No other abnormalities. Musculoskeletal: No acute bony abnormalities. IMPRESSION: 1. No evidence of lymphoma recurrence in the chest, abdomen, or pelvis. 2. Moderate fecal loading throughout the colon. Electronically Signed   By: DDorise BullionIII M.D   On: 01/15/2016 18:30   Ct Biopsy  Result Date: 01/18/2016 CLINICAL DATA:  History of cutaneous lymphoma. Bone marrow biopsy requested to assess for any evidence of lymphoma recurrence. EXAM: CT GUIDED BONE MARROW ASPIRATION AND CORE BIOPSY ANESTHESIA/SEDATION: Versed 2.0 mg IV, Fentanyl 100 mcg IV Total Moderate Sedation Time 20 minutes. The patient's level of consciousness and physiologic status were continuously monitored during the procedure by Radiology nursing. PROCEDURE: The procedure risks, benefits, and alternatives were explained to the patient's mother. Questions regarding the procedure were encouraged and answered. The patient's mother understands and consents to the procedure. A time-out was performed prior to the procedure. The right  gluteal region was prepped with Betadine. Sterile gown and sterile gloves were used for the procedure. Local anesthesia was provided with 1% Lidocaine. Under CT guidance, an 11 gauge OnControl bone cutting needle was advanced from a posterior approach into the right iliac bone. Needle positioning was confirmed with CT. Initial non heparinized and heparinized aspirate samples were obtained of bone marrow. Core biopsy was performed with the 11 gauge needle. COMPLICATIONS: None FINDINGS: Inspection of initial aspirate did reveal visible particles. Intact core biopsy sample was obtained. IMPRESSION: CT guided bone marrow biopsy of right posterior iliac bone with both aspirate and core samples obtained. Electronically Signed   By: GAletta EdouardM.D.   On: 01/18/2016 11:07   Dg Chest Port 1 View  Result Date: 01/21/2016 CLINICAL DATA:  Fever, confusion, history of cutaneous non-Hodgkin's lymphoma EXAM: PORTABLE CHEST 1 VIEW COMPARISON:  CT scan of the chest of January 15, 2016 FINDINGS: The lungs are adequately inflated. There is no focal infiltrate. There is no pleural effusion. The heart and pulmonary vascularity are normal. The mediastinum is normal in width. The bony thorax is unremarkable. IMPRESSION: There is no active cardiopulmonary disease. Electronically Signed   By: David  JMartiniqueM.D.   On: 01/21/2016 08:48   Dg Hip Unilat With Pelvis 2-3 Views Left  Result Date: 01/13/2016 CLINICAL DATA:  45y/o  F; status post fall with anterior hip pain. EXAM: DG HIP (WITH OR WITHOUT PELVIS) 2-3V LEFT COMPARISON:  None. FINDINGS: There is no evidence of hip fracture or dislocation. There is no evidence of arthropathy or other focal bone abnormality. Clips project over the pelvis. IMPRESSION: No acute fracture or dislocation  is identified. Electronically Signed   By: Kristine Garbe M.D.   On: 01/13/2016 02:07      Lesley Atkin Marry Guan M.D on 01/22/2016 at 2:11 PM  Between 7am to 7pm - Pager -  [phone]  After 7pm go to [email or web address] - password [facility]  [facility] -  Office  [phone]

## 2016-01-22 NOTE — Progress Notes (Signed)
[name] is about the same. She might be a little bit more alert.  She has been having some low-grade temperatures. I worried that these are related to the CNS process.  She's not eating much. She has a hard time talking. I'm sure she has a hard time swallowing and probably would aspirate.  There is no obvious painful I can tell.  She's had some incontinence.  Her blood pressure has been on the low side. This morning, is 88/75. She is afebrile.  On her exam, her lungs are clear. Cardiac exam regular rate and rhythm with no murmurs, rubs or bruits. Abdomen is soft. There is no guarding or rebound tenderness. Extremities shows some symmetric muscle if he never lower extremities. Neurological exam shows the profound neurological deficits related to her cerebellum.  On her labs, her potassium is down to 2.8. Her creatinine is 0.55. Calcium 9.1. Her white cell count 6.1. Hemoglobin 11.6.  It is still not obvious has to why this neurological decline has occurred. Again, I do have to worry about the possibility of PML. This would be incredibly rare presentation given that she has not had treatment now for 8 months. I think I may have to talk to the company that makes [employer] to see if they can give [place] any information.  I that the spinal fluid is being checked for the JC virus.  I just wonder if there is any obvious role for a brain biopsy. Ultimately, this may be necessary for [place] to identify what is going on. I realize that this would be an incredibly invasive procedure. Even if we find that she has PML, there is not much that we can offer to help. There is just been anecdotal evidence as to effective therapies. She is not immunosuppressed. She is not HIV positive.  I very much appreciate the expert input from neurology. Everybody is really doing a great job trying to help her.  I talked to her mom last night. Her mom is very faithful through all the compassion that is being shown toward her  daughter.  Lattie Haw, MD  1 Corinthians 13:13

## 2016-01-23 LAB — BASIC METABOLIC PANEL
ANION GAP: 6 (ref 5–15)
BUN: 6 mg/dL (ref 6–20)
CALCIUM: 8.7 mg/dL — AB (ref 8.9–10.3)
CO2: 24 mmol/L (ref 22–32)
CREATININE: 0.54 mg/dL (ref 0.44–1.00)
Chloride: 105 mmol/L (ref 101–111)
Glucose, Bld: 108 mg/dL — ABNORMAL HIGH (ref 65–99)
Potassium: 3.4 mmol/L — ABNORMAL LOW (ref 3.5–5.1)
Sodium: 135 mmol/L (ref 135–145)

## 2016-01-23 LAB — GLUCOSE, CAPILLARY: GLUCOSE-CAPILLARY: 105 mg/dL — AB (ref 65–99)

## 2016-01-23 LAB — TSH: TSH: 1.174 u[IU]/mL (ref 0.350–4.500)

## 2016-01-23 MED ORDER — NICOTINE 14 MG/24HR TD PT24
14.0000 mg | MEDICATED_PATCH | Freq: Every day | TRANSDERMAL | Status: DC
  Administered 2016-01-24 – 2016-01-28 (×5): 14 mg via TRANSDERMAL
  Filled 2016-01-23 (×5): qty 1

## 2016-01-23 MED ORDER — LORAZEPAM 2 MG/ML IJ SOLN: 2.0000 mg | Freq: Once | INTRAMUSCULAR | Status: DC

## 2016-01-23 MED ORDER — LORAZEPAM 2 MG/ML IJ SOLN
1.0000 mg | Freq: Once | INTRAMUSCULAR | Status: AC
  Administered 2016-01-23: 1 mg via INTRAVENOUS
  Filled 2016-01-23: qty 1

## 2016-01-23 NOTE — Progress Notes (Signed)
Patient ID: [name], female   DOB: [birth year], 45 y.o.   MRN: [id number]                                                                PROGRESS NOTE                                                                                                                                                                                                             Patient Demographics:    [name], is a 45 y.o. female, DOB - November 29, 1970, [id number]  Admit date - 01/12/2016   Admitting Physician Theodis Blaze, MD  Outpatient Primary MD for the patient is Tammi Sou, MD  LOS - 10  Outpatient Specialists: Ulyses Jarred (neurologist)  Chief Complaint  Patient presents with  . Weakness       Brief Narrative  45 y.o. female, w difficulty with walking as well as generalized weakness x 4 weeks pt has been having increase in falls. MRI brain 8/18 => Interval progression of white matter signal abnormality, greatest in the pons, compared to the study 1 month earlier. 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    Subjective:    [name] today with no headache, No chest pain, she has intermittently been agitated, but seems to be more interactive today. Mother and other family at bedside this AM. Denies any pain or nausea.   Assessment  & Plan :    Principal Problem:   Weakness Active Problems:   Ataxia   Gait abnormality   Fever  Ataxia/Gait problems with subacute progressive cerebral dysfunction - MRI brain from 01/14/16 shows abnormal T2 signal in the pons . 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 Paraneoplastic panel still pending, send out to  [facility]. 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, completed both steroids and IVIG 8/19-8/23 without significant improvement - Ammonia, B-12 within normal limits - Even though low probability, will check CJ virus PCR to rule out PML, was added  to spinal fluid from spinal tap on 8/17  - Neurology following, repeat EEG is nonspecific - TSH was not checked, drew this today  H/o recurrent follicular continuous lymphoma (non-Hodgkin's) - last chemotherapy completed  05/2016 - Neurology input greatly appreciated, CT scans with no evidence of lymphoma recurrence, PET scan 2 months with no evidence of recurrence, LP with no evidence of malignant cells , bone marrow biopsy 8/22 per Dr. Marin Olp, no evidence of malignancy  Hypokalemia - Repleted 8/26, recheck this AM pending.  Fever - Patient with continued low grade fever this morning, no leukocytosis, chest x-ray with no evidence of infiltrate, urinalysis unremarkable as well  Code Status : Full  Family communication: D/W mother at bedside this AM  Disposition Plan : Likely will need SNF placement, but not ready for discharge yet, pending further workup  Consults  :  Oncology/neurology  DVT Prophylaxis  :  Heparin   Lab Results  Component Value Date   PLT 220 01/22/2016    Antibiotics  :   Anti-infectives    None        Objective:   Vitals:   01/22/16 2225 01/23/16 0112 01/23/16 0637 01/23/16 1050  BP: (!) 86/42 (!) 120/52 (!) 98/41 (!) 107/39  Pulse: 64 89 72 86  Resp: '18  18 18  '$ Temp: 98.6 F (37 C)  98.8 F (37.1 C) 98.1 F (36.7 C)  TempSrc: Oral  Axillary Oral  SpO2: 99%  100% 100%  Weight:      Height:        Wt Readings from Last 3 Encounters:  01/22/16 48.4 kg (106 lb 12.8 oz)  12/30/15 48.9 kg (107 lb 12.8 oz)  12/27/15 48.8 kg (107 lb 8 oz)     Intake/Output Summary (Last 24 hours) at 01/23/16 1221 Last data filed at 01/23/16 0758  Gross per 24 hour  Intake               240 ml  Output                0 ml  Net              240 ml     Physical Exam  Awake, but not oriented, more calm and conversant today. Supple Neck,No JV Symmetrical Chest wall movement, Good air movement bilaterally, CTAB RRR,No Gallops,Rubs or new Murmurs, No Parasternal Heave +ve B.Sounds, Abd Soft, No tenderness, No rebound - guarding or rigidity. No Cyanosis, Clubbing or edema, No new Rash or bruise  , poor coordination, dysmetria.    Data Review:    CBC  Recent Labs Lab 01/18/16 0704 01/21/16 0907 01/22/16 0515  WBC 6.7 6.3 6.1  HGB 11.8* 11.0* 11.6*  HCT 35.9* 32.8* 35.4*  PLT 251 214 220  MCV 91.6 88.9 89.6  MCH 30.1 29.8 29.4  MCHC 32.9 33.5 32.8  RDW 13.3 13.2 12.9  LYMPHSABS 0.6* 0.7  --   MONOABS 0.6 0.7  --   EOSABS 0.0 0.0  --   BASOSABS 0.0 0.0  --     Chemistries   Recent Labs Lab 01/19/16 0902 01/20/16 0545 01/22/16 0515 01/22/16 0909  NA 133* 132* 135 134*  K 3.2* 3.6 2.8* 2.9*  CL 102 100* 100* 103  CO2 '25 25 27 25  '$ GLUCOSE 108* 127* 116* 144*  BUN 12 12 <5* <5*  CREATININE 0.66 0.54 0.55 0.61  CALCIUM 9.4 9.2 9.1 9.0  MG  --   --   --  2.0  AST  --   --   --  67*  ALT  --   --   --  110*  ALKPHOS  --   --   --  63  BILITOT  --   --   --  0.7   ------------------------------------------------------------------------------------------------------------------ No results for input(s): CHOL, HDL, LDLCALC, TRIG, CHOLHDL, LDLDIRECT in the last 72 hours.  No results found for: HGBA1C ------------------------------------------------------------------------------------------------------------------ No results for input(s): TSH, T4TOTAL, T3FREE, THYROIDAB in the last 72 hours.  Invalid input(s): FREET3 ------------------------------------------------------------------------------------------------------------------ No results for input(s): VITAMINB12, FOLATE, FERRITIN, TIBC, IRON, RETICCTPCT in the last 72 hours.  Coagulation  profile  Recent Labs Lab 01/18/16 0704  INR 1.09    No results for input(s): DDIMER in the last 72 hours.  Cardiac Enzymes No results for input(s): CKMB, TROPONINI, MYOGLOBIN in the last 168 hours.  Invalid input(s): CK ------------------------------------------------------------------------------------------------------------------ No results found for: BNP  Inpatient Medications  Scheduled Meds: . heparin subcutaneous  5,000 Units Subcutaneous Q8H  . nicotine  14 mg Transdermal Daily  . pantoprazole  40 mg Oral Daily  . polyethylene glycol  17 g Oral Daily  . potassium chloride SA  20 mEq Oral Daily  . sodium chloride flush  3 mL Intravenous Q12H  . thiamine  100 mg Oral Daily  . Vitamin D (Ergocalciferol)  50,000 Units Oral Weekly   Continuous Infusions: . dextrose 5 % and 0.9% NaCl 75 mL/hr at 01/23/16 0249  . lactated ringers 10 mL/hr at 01/14/16 1538   PRN Meds:.acetaminophen **OR** acetaminophen, bisacodyl, LORazepam, mineral oil, traMADol  Micro Results No results found for this or any previous visit (from the past 240 hour(s)).  Radiology Reports Ct Head Wo Contrast  Result Date: 01/13/2016 CLINICAL DATA:  45 y/o F; increased weakness and difficulty with speech mobility over the last 3 weeks. History of non-Hodgkin's lymphoma. EXAM: CT HEAD WITHOUT CONTRAST TECHNIQUE: Contiguous axial images were obtained from the base of the skull through the vertex without intravenous contrast. COMPARISON:  MRI of the brain dated 12/20/2015. FINDINGS: Brain: No evidence of acute infarction, hemorrhage, hydrocephalus, extra-axial collection or mass lesion/mass effect. Moderate generalized atrophy for patient's age. Few lucencies in white matter and within the pons corresponding FLAIR signal abnormality on prior MRI and are nonspecific. Vascular: No hyperdense vessel or unexpected calcification. Skull: No fracture is identified.  No suspicious lesion. Sinuses/Orbits: No acute  finding. Other: No unexpected finding. IMPRESSION: No acute intracranial abnormality is identified. Stable generalized volume loss and mild nonspecific white matter lucencies possibly representing microvascular disease or posttreatment changes. Electronically Signed   By: Kristine Garbe M.D.   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 Staging. Subsequent encounter. EXAM: CT NECK WITH CONTRAST TECHNIQUE: Multidetector CT imaging of the neck was performed using the standard protocol following the bolus administration of intravenous contrast. CONTRAST:  168m ISOVUE-300 IOPAMIDOL (ISOVUE-300) INJECTION 61% in conjunction with contrast enhanced imaging of the chest, abdomen, and pelvis reported separately. COMPARISON:  Brain MRI 01/14/2016. Head CT without contrast 01/13/2016. PET-CT 11/03/2015. FINDINGS: Pharynx and larynx: Larynx is normal in best seen on series 6, image 70. Pharyngeal soft tissue contours are within normal limits. Negative parapharyngeal and retropharyngeal spaces. Salivary glands: Negative sublingual space, sublingual glands, submandibular glands, and parotid glands. Thyroid: Negative. Lymph nodes: No cervical lymphadenopathy. Only diminutive (4 mm short axis or smaller) bilateral lymph nodes are identified. Vascular:  Major vascular structures in the neck and at skullbase are patent and appear normal. Limited intracranial: Stable from yesterday brain MRI. Visualized orbits: Negative. Mastoids and visualized paranasal sinuses: Visualized paranasal sinuses and mastoids are stable and well pneumatized. Skeleton: No osseous abnormality identified. Upper chest: Chest CT is reported separately today. IMPRESSION: 1. Negative neck CT.  No evidence of lymphoma in the neck. 2. CT chest abdomen and pelvis today reported separately. Electronically Signed   By: Genevie Ann M.D.   On:  01/15/2016 18:23   Ct Chest W Contrast  Result Date: 01/15/2016 CLINICAL DATA:  Recurrent lymphoma. EXAM: CT CHEST, ABDOMEN, AND PELVIS WITH CONTRAST TECHNIQUE: Multidetector CT imaging of the chest, abdomen and pelvis was performed following the standard protocol during bolus administration of intravenous contrast. CONTRAST:  152m ISOVUE-300 IOPAMIDOL (ISOVUE-300) INJECTION 61% COMPARISON:  None. FINDINGS: CT CHEST FINDINGS Cardiovascular: The heart and great vessels are normal. Mediastinum/Nodes: Normal.  No adenopathy. Lungs/Pleura: Minimal linear opacity anteriorly in the right lung on series 3, image 61 is stable and of doubtful significance. This may represent a tiny region of scar. The lungs are otherwise normal. Musculoskeletal: Normal. CT ABDOMEN PELVIS FINDINGS Hepatobiliary: Normal. Pancreas: Normal. Spleen: Normal. Adrenals/Urinary Tract: Normal. Stomach/Bowel: The stomach and small bowel are normal. Moderate fecal loading is seen throughout the colon. The colon is otherwise normal. The cecum is seen in the right side of the pelvis. Visualized portions of the appendix are normal. Vascular/Lymphatic: No aneurysm or dissection.  No adenopathy. Reproductive: The patient is status post tubal ligation. The uterus and adnexae are normal. Other: No other abnormalities. Musculoskeletal: No acute bony abnormalities. IMPRESSION: 1. No evidence of lymphoma recurrence in the chest, abdomen, or pelvis. 2. Moderate fecal loading throughout the colon. 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 Recent PET scan was negative for disease. Recent onset of difficulty walking and speech issues. 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 EXAM: MRI HEAD WITHOUT AND WITH CONTRAST TECHNIQUE: Multiplanar, multiecho pulse sequences  of the brain and surrounding structures were obtained without and with intravenous contrast. CONTRAST:  183mMULTIHANCE GADOBENATE DIMEGLUMINE 529 MG/ML IV SOLN COMPARISON:  12/20/2015. FINDINGS: No acute stroke or hemorrhage.  No mass lesion or extra-axial fluid. Global atrophy, hydrocephalus ex vacuo. Significant premature brain substance loss affecting the cerebral hemispheres and cerebellum. Premature for age subcortical greater than periventricular white matter signal abnormality, also involving the pons. No definite involvement of the cerebellar white matter. There is moderate interval progression of the T2 and FLAIR hyperintensity in the pons, and to a lesser degree, progression in the subcortical white matter, compared with the study 1 month earlier. These show no restriction, susceptibility, or postcontrast enhancement. Partial empty sella. Flow voids are maintained. Extracranial soft tissues are unremarkable. Post infusion imaging through the entire head demonstrates no abnormal enhancement of brain or meninges. IMPRESSION: Interval progression of white matter signal abnormality, greatest in the pons, compared to the study 1 month earlier. These white matter lesions show no restriction, susceptibility, or postcontrast enhancement. 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 Global atrophy affecting the cerebral hemispheres, brainstem, and cerebellum, stable from priors. No areas of restricted diffusion or postcontrast enhancement. Electronically Signed   By: JoStaci Righter.D.   On: 01/14/2016 18:37   Ct Abdomen Pelvis W Contrast  Result Date:  01/15/2016 CLINICAL DATA:  Recurrent lymphoma. EXAM: CT CHEST, ABDOMEN, AND PELVIS WITH CONTRAST TECHNIQUE: Multidetector CT imaging of the chest, abdomen and pelvis was performed following the standard protocol during bolus administration of intravenous contrast. CONTRAST:  153m ISOVUE-300 IOPAMIDOL (ISOVUE-300)  INJECTION 61% COMPARISON:  None. FINDINGS: CT CHEST FINDINGS Cardiovascular: The heart and great vessels are normal. Mediastinum/Nodes: Normal.  No adenopathy. Lungs/Pleura: Minimal linear opacity anteriorly in the right lung on series 3, image 61 is stable and of doubtful significance. This may represent a tiny region of scar. The lungs are otherwise normal. Musculoskeletal: Normal. CT ABDOMEN PELVIS FINDINGS Hepatobiliary: Normal. Pancreas: Normal. Spleen: Normal. Adrenals/Urinary Tract: Normal. Stomach/Bowel: The stomach and small bowel are normal. Moderate fecal loading is seen throughout the colon. The colon is otherwise normal. The cecum is seen in the right side of the pelvis. Visualized portions of the appendix are normal. Vascular/Lymphatic: No aneurysm or dissection.  No adenopathy. Reproductive: The patient is status post tubal ligation. The uterus and adnexae are normal. Other: No other abnormalities. Musculoskeletal: No acute bony abnormalities. IMPRESSION: 1. No evidence of lymphoma recurrence in the chest, abdomen, or pelvis. 2. Moderate fecal loading throughout the colon. Electronically Signed   By: DDorise BullionIII M.D   On: 01/15/2016 18:30   Ct Biopsy  Result Date: 01/18/2016 CLINICAL DATA:  History of cutaneous lymphoma. Bone marrow biopsy requested to assess for any evidence of lymphoma recurrence. EXAM: CT GUIDED BONE MARROW ASPIRATION AND CORE BIOPSY ANESTHESIA/SEDATION: Versed 2.0 mg IV, Fentanyl 100 mcg IV Total Moderate Sedation Time 20 minutes. The patient's level of consciousness and physiologic status were continuously monitored during the procedure by Radiology nursing. PROCEDURE: The procedure risks, benefits, and alternatives were explained to the patient's mother. Questions regarding the procedure were encouraged and answered. The patient's mother understands and consents to the procedure. A time-out was performed prior to the procedure. The right gluteal region was prepped  with Betadine. Sterile gown and sterile gloves were used for the procedure. Local anesthesia was provided with 1% Lidocaine. Under CT guidance, an 11 gauge OnControl bone cutting needle was advanced from a posterior approach into the right iliac bone. Needle positioning was confirmed with CT. Initial non heparinized and heparinized aspirate samples were obtained of bone marrow. Core biopsy was performed with the 11 gauge needle. COMPLICATIONS: None FINDINGS: Inspection of initial aspirate did reveal visible particles. Intact core biopsy sample was obtained. IMPRESSION: CT guided bone marrow biopsy of right posterior iliac bone with both aspirate and core samples obtained. Electronically Signed   By: GAletta EdouardM.D.   On: 01/18/2016 11:07   Dg Chest Port 1 View  Result Date: 01/21/2016 CLINICAL DATA:  Fever, confusion, history of cutaneous non-Hodgkin's lymphoma EXAM: PORTABLE CHEST 1 VIEW COMPARISON:  CT scan of the chest of January 15, 2016 FINDINGS: The lungs are adequately inflated. There is no focal infiltrate. There is no pleural effusion. The heart and pulmonary vascularity are normal. The mediastinum is normal in width. The bony thorax is unremarkable. IMPRESSION: There is no active cardiopulmonary disease. Electronically Signed   By: David  JMartiniqueM.D.   On: 01/21/2016 08:48   Dg Hip Unilat With Pelvis 2-3 Views Left  Result Date: 01/13/2016 CLINICAL DATA:  45y/o  F; status post fall with anterior hip pain. EXAM: DG HIP (WITH OR WITHOUT PELVIS) 2-3V LEFT COMPARISON:  None. FINDINGS: There is no evidence of hip fracture or dislocation. There is no evidence of arthropathy or other focal bone abnormality.  Clips project over the pelvis. IMPRESSION: No acute fracture or dislocation is identified. Electronically Signed   By: Kristine Garbe M.D.   On: 01/13/2016 02:07      Mir Marry Guan M.D on 01/23/2016 at 12:21 PM  Between 7am to 7pm - Pager - [phone]  After 7pm go to  [email or web address] - password [email or web address]  [facility] -  Office  [phone]

## 2016-01-23 NOTE — Progress Notes (Signed)
Ms Bellavance is very upset, yelling, hitting staff and trying to get out of the bed unassisted.  She refuses her oral ativan.  Order obtained for an intravenous dose.

## 2016-01-23 NOTE — Plan of Care (Signed)
Problem: Education: Goal: Knowledge of [facility] Education information/materials will improve Outcome: Not Progressing Patient needs further teaching.  Unable to understand due to condition

## 2016-01-24 DIAGNOSIS — O864 Pyrexia of unknown origin following delivery: Secondary | ICD-10-CM

## 2016-01-24 LAB — HEPATIC FUNCTION PANEL
ALK PHOS: 58 U/L (ref 38–126)
ALT: 117 U/L — AB (ref 14–54)
AST: 82 U/L — ABNORMAL HIGH (ref 15–41)
Albumin: 3.3 g/dL — ABNORMAL LOW (ref 3.5–5.0)
BILIRUBIN DIRECT: 0.1 mg/dL (ref 0.1–0.5)
BILIRUBIN INDIRECT: 0.3 mg/dL (ref 0.3–0.9)
BILIRUBIN TOTAL: 0.4 mg/dL (ref 0.3–1.2)
Total Protein: 6.7 g/dL (ref 6.5–8.1)

## 2016-01-24 LAB — C-REACTIVE PROTEIN

## 2016-01-24 LAB — JC VIRUS, PCR CSF: JC VIRUS PCR (CSF): NEGATIVE

## 2016-01-24 LAB — GLUCOSE, CAPILLARY
GLUCOSE-CAPILLARY: 109 mg/dL — AB (ref 65–99)
Glucose-Capillary: 95 mg/dL (ref 65–99)
Glucose-Capillary: 104 mg/dL — ABNORMAL HIGH (ref 65–99)
Glucose-Capillary: 124 mg/dL — ABNORMAL HIGH (ref 65–99)

## 2016-01-24 LAB — SEDIMENTATION RATE: Sed Rate: 70 mm/hr — ABNORMAL HIGH (ref 0–22)

## 2016-01-24 MED ORDER — KCL IN DEXTROSE-NACL 40-5-0.9 MEQ/L-%-% IV SOLN
INTRAVENOUS | Status: DC
  Administered 2016-01-24 – 2016-01-26 (×3): via INTRAVENOUS
  Filled 2016-01-24 (×5): qty 1000

## 2016-01-24 MED ORDER — WHITE PETROLATUM GEL
Status: AC
  Administered 2016-01-24: 10:00:00
  Filled 2016-01-24: qty 1

## 2016-01-24 NOTE — Care Management Note (Signed)
Case Management Note  Patient Details  Name: [name] MRN: [id number] Date of Birth: [birth year]  Subjective/Objective:                    Action/Plan: Plan is SNF when patient is medically ready. Awaiting test results. CM following for d/c needs.   Expected Discharge Date:   (unknown)               Expected Discharge Plan:  [place]  In-House Referral:     Discharge planning Services     Post Acute Care Choice:    Choice offered to:     DME Arranged:    DME Agency:     HH Arranged:    [place] Agency:     Status of Service:  In process, will continue to follow  If discussed at Long Length of Stay Meetings, dates discussed:    Additional Comments:  Pollie Friar, RN 01/24/2016, 1:38 PM

## 2016-01-24 NOTE — Progress Notes (Signed)
Patient has red rash on back from shoulders to buttocks and upper chest.  She does not appear to be itching.

## 2016-01-24 NOTE — Progress Notes (Signed)
Patient ID: [name], female   DOB: 17-Nov-1970, 45 y.o.   MRN: [id number]                                                                PROGRESS NOTE                                                                                                                                                                                                             Patient Demographics:    [name], is a 45 y.o. female, DOB - [birth year], VOH:[id number]  Admit date - 01/12/2016   Admitting Physician Theodis Blaze, MD  Outpatient Primary MD for the patient is Tammi Sou, MD  LOS - 11  Outpatient Specialists: Ulyses Jarred (neurologist)  Chief Complaint  Patient presents with  . Weakness       Brief Narrative  45 y.o. female, w difficulty with walking as well as generalized weakness x 4 weeks pt has been having increase in falls. MRI brain 8/18 => Interval progression of white matter signal abnormality, greatest in the pons, compared to the study 1 month earlier. 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    Subjective:    [name] today with no headache, No chest pain,  Somewhat more alert,family friend and sitter in the room   Assessment  & Plan :    Principal Problem:   Weakness Active Problems:   Ataxia   Gait abnormality   Fever  Ataxia/Gait problems with subacute progressive cerebral dysfunction - MRI brain from 01/14/16 shows abnormal T2 signal in the pons . 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 Paraneoplastic panel still pending, send out to [facility]. 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, completed both  steroids and IVIG 8/19-8/23 without significant improvement - Ammonia, B-12 within normal limits - Even though low probability, JC virus CSF PCR to rule out PML was negative, was added to spinal fluid from spinal tap on 8/17  - Neurology following, repeat EEG  is nonspecific - TSH  within normal limits,  H/o recurrent follicular continuous lymphoma (non-Hodgkin's) - last chemotherapy completed  05/2016 - Neurology input greatly appreciated, CT scans with no evidence of lymphoma recurrence, PET scan 2 months with no evidence of recurrence, LP with no evidence of malignant cells , bone marrow biopsy 8/22 per Dr. Marin Olp, no evidence of malignancy  Hypokalemia - Repleted 8/26, recheck this AM pending.  Fever - Patient with continued low grade fever this morning, no leukocytosis, chest x-ray with no evidence of infiltrate, urinalysis unremarkable as well  Elevated AST/ALT Repeat,  Code Status : Full  Family communication: D/W family friend ,by the bedside   Disposition Plan : Likely will need SNF placement, but not ready for discharge yet, pending further workup  Consults  :  Oncology/neurology  DVT Prophylaxis  :  Heparin   Lab Results  Component Value Date   PLT 220 01/22/2016    Antibiotics  :   Anti-infectives    None        Objective:   Vitals:   01/23/16 1050 01/23/16 2126 01/24/16 0118 01/24/16 0446  BP: (!) 107/39 (!) 114/48 (!) 125/55 (!) 97/46  Pulse: 86 84 (!) 104 85  Resp: '18 18 18 18  '$ Temp: 98.1 F (36.7 C) 98.1 F (36.7 C) 98.1 F (36.7 C) 97.8 F (36.6 C)  TempSrc: Oral Oral Oral Oral  SpO2: 100% 98% 100% 100%  Weight:      Height:        Wt Readings from Last 3 Encounters:  01/22/16 48.4 kg (106 lb 12.8 oz)  12/30/15 48.9 kg (107 lb 12.8 oz)  12/27/15 48.8 kg (107 lb 8 oz)     Intake/Output Summary (Last 24 hours) at 01/24/16 3570 Last data filed at 01/24/16 0700  Gross per 24 hour  Intake          5098.75 ml  Output              750  ml  Net          4348.75 ml     Physical Exam  Awake, but not oriented, more calm and conversant today. Supple Neck,No JV Symmetrical Chest wall movement, Good air movement bilaterally, CTAB RRR,No Gallops,Rubs or new Murmurs, No Parasternal Heave +ve B.Sounds, Abd Soft, No tenderness, No rebound - guarding or rigidity. No Cyanosis, Clubbing or edema, No new Rash or bruise  , poor coordination, dysmetria.    Data Review:    CBC  Recent Labs Lab 01/18/16 0704 01/21/16 0907 01/22/16 0515  WBC 6.7 6.3 6.1  HGB 11.8* 11.0* 11.6*  HCT 35.9* 32.8* 35.4*  PLT 251 214 220  MCV 91.6 88.9 89.6  MCH 30.1 29.8 29.4  MCHC 32.9 33.5 32.8  RDW 13.3 13.2 12.9  LYMPHSABS 0.6* 0.7  --   MONOABS 0.6 0.7  --   EOSABS 0.0 0.0  --   BASOSABS 0.0 0.0  --     Chemistries   Recent Labs Lab 01/19/16 0902 01/20/16 0545 01/22/16 0515 01/22/16 0909 01/23/16 1146  NA 133* 132* 135 134* 135  K 3.2* 3.6 2.8* 2.9* 3.4*  CL 102 100* 100* 103 105  CO2 '25 25 27 25 24  '$ GLUCOSE 108* 127* 116* 144* 108*  BUN 12 12 <5* <5* 6  CREATININE 0.66 0.54 0.55 0.61 0.54  CALCIUM 9.4 9.2 9.1 9.0 8.7*  MG  --   --   --  2.0  --   AST  --   --   --  67*  --   ALT  --   --   --  110*  --   ALKPHOS  --   --   --  63  --   BILITOT  --   --   --  0.7  --    ------------------------------------------------------------------------------------------------------------------ No results for input(s): CHOL, HDL, LDLCALC, TRIG, CHOLHDL, LDLDIRECT in the last 72 hours.  No results found for: HGBA1C ------------------------------------------------------------------------------------------------------------------  Recent Labs  01/23/16 1146  TSH 1.174   ------------------------------------------------------------------------------------------------------------------ No results for input(s): VITAMINB12, FOLATE, FERRITIN, TIBC, IRON, RETICCTPCT in the last 72 hours.  Coagulation profile  Recent Labs Lab  01/18/16 0704  INR 1.09    No results for input(s): DDIMER in the last 72 hours.  Cardiac Enzymes No results for input(s): CKMB, TROPONINI, MYOGLOBIN in the last 168 hours.  Invalid input(s): CK ------------------------------------------------------------------------------------------------------------------ No results found for: BNP  Inpatient Medications  Scheduled Meds: . heparin subcutaneous  5,000 Units Subcutaneous Q8H  . nicotine  14 mg Transdermal Daily  . pantoprazole  40 mg Oral Daily  . polyethylene glycol  17 g Oral Daily  . potassium chloride SA  20 mEq Oral Daily  . sodium chloride flush  3 mL Intravenous Q12H  . thiamine  100 mg Oral Daily  . Vitamin D (Ergocalciferol)  50,000 Units Oral Weekly   Continuous Infusions: . dextrose 5 % and 0.9 % NaCl with KCl 40 mEq/L    . lactated ringers 10 mL/hr at 01/14/16 1538   PRN Meds:.acetaminophen **OR** acetaminophen, bisacodyl, LORazepam, mineral oil, traMADol  Micro Results No results found for this or any previous visit (from the past 240 hour(s)).  Radiology Reports Ct Head Wo Contrast  Result Date: 01/13/2016 CLINICAL DATA:  45 y/o F; increased weakness and difficulty with speech mobility over the last 3 weeks. History of non-Hodgkin's lymphoma. EXAM: CT HEAD WITHOUT CONTRAST TECHNIQUE: Contiguous axial images were obtained from the base of the skull through the vertex without intravenous contrast. COMPARISON:  MRI of the brain dated 12/20/2015. FINDINGS: Brain: No evidence of acute infarction, hemorrhage, hydrocephalus, extra-axial collection or mass lesion/mass effect. Moderate generalized atrophy for patient's age. Few lucencies in white matter and within the pons corresponding FLAIR signal abnormality on prior MRI and are nonspecific. Vascular: No hyperdense vessel or unexpected calcification. Skull: No fracture is identified.  No suspicious lesion. Sinuses/Orbits: No acute finding. Other: No unexpected finding.  IMPRESSION: No acute intracranial abnormality is identified. Stable generalized volume loss and mild nonspecific white matter lucencies possibly representing microvascular disease or posttreatment changes. Electronically Signed   By: Kristine Garbe M.D.   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 Staging. Subsequent encounter. EXAM: CT NECK WITH CONTRAST TECHNIQUE: Multidetector CT imaging of the neck was performed using the standard protocol following the bolus administration of intravenous contrast. CONTRAST:  173m ISOVUE-300 IOPAMIDOL (ISOVUE-300) INJECTION 61% in conjunction with contrast enhanced imaging of the chest, abdomen, and pelvis reported separately. COMPARISON:  Brain MRI 01/14/2016. Head CT without contrast 01/13/2016. PET-CT 11/03/2015. FINDINGS: Pharynx and larynx: Larynx is normal in best seen on series 6, image 70. Pharyngeal soft tissue contours are within normal limits. Negative parapharyngeal and retropharyngeal spaces. Salivary glands: Negative sublingual space, sublingual glands, submandibular glands, and parotid glands. Thyroid: Negative. Lymph nodes: No cervical lymphadenopathy. Only diminutive (4 mm short axis or smaller) bilateral lymph nodes are identified. Vascular: Major vascular structures in  the neck and at skullbase are patent and appear normal. Limited intracranial: Stable from yesterday brain MRI. Visualized orbits: Negative. Mastoids and visualized paranasal sinuses: Visualized paranasal sinuses and mastoids are stable and well pneumatized. Skeleton: No osseous abnormality identified. Upper chest: Chest CT is reported separately today. IMPRESSION: 1. Negative neck CT.  No evidence of lymphoma in the neck. 2. CT chest abdomen and pelvis today reported separately. Electronically Signed   By: Genevie Ann M.D.   On: 01/15/2016 18:23   Ct Chest W  Contrast  Result Date: 01/15/2016 CLINICAL DATA:  Recurrent lymphoma. EXAM: CT CHEST, ABDOMEN, AND PELVIS WITH CONTRAST TECHNIQUE: Multidetector CT imaging of the chest, abdomen and pelvis was performed following the standard protocol during bolus administration of intravenous contrast. CONTRAST:  145m ISOVUE-300 IOPAMIDOL (ISOVUE-300) INJECTION 61% COMPARISON:  None. FINDINGS: CT CHEST FINDINGS Cardiovascular: The heart and great vessels are normal. Mediastinum/Nodes: Normal.  No adenopathy. Lungs/Pleura: Minimal linear opacity anteriorly in the right lung on series 3, image 61 is stable and of doubtful significance. This may represent a tiny region of scar. The lungs are otherwise normal. Musculoskeletal: Normal. CT ABDOMEN PELVIS FINDINGS Hepatobiliary: Normal. Pancreas: Normal. Spleen: Normal. Adrenals/Urinary Tract: Normal. Stomach/Bowel: The stomach and small bowel are normal. Moderate fecal loading is seen throughout the colon. The colon is otherwise normal. The cecum is seen in the right side of the pelvis. Visualized portions of the appendix are normal. Vascular/Lymphatic: No aneurysm or dissection.  No adenopathy. Reproductive: The patient is status post tubal ligation. The uterus and adnexae are normal. Other: No other abnormalities. Musculoskeletal: No acute bony abnormalities. IMPRESSION: 1. No evidence of lymphoma recurrence in the chest, abdomen, or pelvis. 2. Moderate fecal loading throughout the colon. 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 Recent PET scan was negative for disease. Recent onset of difficulty walking and speech issues. 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 EXAM: MRI HEAD WITHOUT AND WITH CONTRAST TECHNIQUE: Multiplanar, multiecho pulse sequences of the brain and surrounding  structures were obtained without and with intravenous contrast. CONTRAST:  141mMULTIHANCE GADOBENATE DIMEGLUMINE 529 MG/ML IV SOLN COMPARISON:  12/20/2015. FINDINGS: No acute stroke or hemorrhage.  No mass lesion or extra-axial fluid. Global atrophy, hydrocephalus ex vacuo. Significant premature brain substance loss affecting the cerebral hemispheres and cerebellum. Premature for age subcortical greater than periventricular white matter signal abnormality, also involving the pons. No definite involvement of the cerebellar white matter. There is moderate interval progression of the T2 and FLAIR hyperintensity in the pons, and to a lesser degree, progression in the subcortical white matter, compared with the study 1 month earlier. These show no restriction, susceptibility, or postcontrast enhancement. Partial empty sella. Flow voids are maintained. Extracranial soft tissues are unremarkable. Post infusion imaging through the entire head demonstrates no abnormal enhancement of brain or meninges. IMPRESSION: Interval progression of white matter signal abnormality, greatest in the pons, compared to the study 1 month earlier. These white matter lesions show no restriction, susceptibility, or postcontrast enhancement. 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 Global atrophy affecting the cerebral hemispheres, brainstem, and cerebellum, stable from priors. No areas of restricted diffusion or postcontrast enhancement. Electronically Signed   By: JoStaci Righter.D.   On: 01/14/2016 18:37   Ct Abdomen Pelvis W Contrast  Result Date: 01/15/2016 CLINICAL DATA:  Recurrent lymphoma. EXAM: CT CHEST, ABDOMEN, AND PELVIS WITH CONTRAST TECHNIQUE: Multidetector CT imaging of the chest, abdomen and pelvis was performed following the standard protocol during bolus administration of intravenous contrast. CONTRAST:  189m ISOVUE-300 IOPAMIDOL (ISOVUE-300) INJECTION 61% COMPARISON:  None.  FINDINGS: CT CHEST FINDINGS Cardiovascular: The heart and great vessels are normal. Mediastinum/Nodes: Normal.  No adenopathy. Lungs/Pleura: Minimal linear opacity anteriorly in the right lung on series 3, image 61 is stable and of doubtful significance. This may represent a tiny region of scar. The lungs are otherwise normal. Musculoskeletal: Normal. CT ABDOMEN PELVIS FINDINGS Hepatobiliary: Normal. Pancreas: Normal. Spleen: Normal. Adrenals/Urinary Tract: Normal. Stomach/Bowel: The stomach and small bowel are normal. Moderate fecal loading is seen throughout the colon. The colon is otherwise normal. The cecum is seen in the right side of the pelvis. Visualized portions of the appendix are normal. Vascular/Lymphatic: No aneurysm or dissection.  No adenopathy. Reproductive: The patient is status post tubal ligation. The uterus and adnexae are normal. Other: No other abnormalities. Musculoskeletal: No acute bony abnormalities. IMPRESSION: 1. No evidence of lymphoma recurrence in the chest, abdomen, or pelvis. 2. Moderate fecal loading throughout the colon. Electronically Signed   By: DDorise BullionIII M.D   On: 01/15/2016 18:30   Ct Biopsy  Result Date: 01/18/2016 CLINICAL DATA:  History of cutaneous lymphoma. Bone marrow biopsy requested to assess for any evidence of lymphoma recurrence. EXAM: CT GUIDED BONE MARROW ASPIRATION AND CORE BIOPSY ANESTHESIA/SEDATION: Versed 2.0 mg IV, Fentanyl 100 mcg IV Total Moderate Sedation Time 20 minutes. The patient's level of consciousness and physiologic status were continuously monitored during the procedure by Radiology nursing. PROCEDURE: The procedure risks, benefits, and alternatives were explained to the patient's mother. Questions regarding the procedure were encouraged and answered. The patient's mother understands and consents to the procedure. A time-out was performed prior to the procedure. The right gluteal region was prepped with Betadine. Sterile gown and  sterile gloves were used for the procedure. Local anesthesia was provided with 1% Lidocaine. Under CT guidance, an 11 gauge OnControl bone cutting needle was advanced from a posterior approach into the right iliac bone. Needle positioning was confirmed with CT. Initial non heparinized and heparinized aspirate samples were obtained of bone marrow. Core biopsy was performed with the 11 gauge needle. COMPLICATIONS: None FINDINGS: Inspection of initial aspirate did reveal visible particles. Intact core biopsy sample was obtained. IMPRESSION: CT guided bone marrow biopsy of right posterior iliac bone with both aspirate and core samples obtained. Electronically Signed   By: GAletta EdouardM.D.   On: 01/18/2016 11:07   Dg Chest Port 1 View  Result Date: 01/21/2016 CLINICAL DATA:  Fever, confusion, history of cutaneous non-Hodgkin's lymphoma EXAM: PORTABLE CHEST 1 VIEW COMPARISON:  CT scan of the chest of January 15, 2016 FINDINGS: The lungs are adequately inflated. There is no focal infiltrate. There is no pleural effusion. The heart and pulmonary vascularity are normal. The mediastinum is normal in width. The bony thorax is unremarkable. IMPRESSION: There is no active cardiopulmonary disease. Electronically Signed   By: David  JMartiniqueM.D.   On: 01/21/2016 08:48   Dg Hip Unilat With Pelvis 2-3 Views Left  Result Date: 01/13/2016 CLINICAL DATA:  45y/o  F; status post fall with anterior hip pain. EXAM: DG HIP (WITH OR WITHOUT PELVIS) 2-3V LEFT COMPARISON:  None. FINDINGS: There is no evidence of hip fracture or dislocation. There is no evidence of arthropathy or other focal bone abnormality. Clips project over the  pelvis. IMPRESSION: No acute fracture or dislocation is identified. Electronically Signed   By: Kristine Garbe M.D.   On: 01/13/2016 02:07      [name] M.D on 01/24/2016 at 8:22 AM  Between 7am to 7pm - Pager - [phone]  After 7pm go to [email or web address] - password [email or web address]  [facility] -  Office  [phone]

## 2016-01-24 NOTE — Progress Notes (Signed)
[name] actually looks better to me. She is somewhat more alert. I can understand her a little bit better. Maybe, the IVIG that she got last week is starting to help a little bit.  I'm not sure how much she is able to eat. I am not sure how much she is able to get out of bed.  There is no obvious pain. She has had no seizures. There is no obvious visual issues. Her speech sounds a little bit better.  Her labs yesterday looked okay. Her potassium was a little bit low.  She did have some slightly elevated LFTs on the 26th. They probably need be rechecked. I would think that that might be medication related.  She has been afebrile.  I that her spinal fluid is being sent for JC virus analysis.  On her physical exam, her blood pressure is 97/46. She is afebrile. Her pulse is 85. Her oral exam shows no thrush. Her neck is supple. Her lungs are clear eared cardiac exam regular rate and rhythm with no murmurs, rubs or bruits. Abdomen is soft. There is no fluid wave. There is no palpable liver or spleen tip. Extremity shows no clubbing, cyanosis or edema. Neurological exam shows the neurological changes but again she seems to be a little better with her voice and her cognition.  It is still unclear as to why [name] has his neurological decline area and again, looks like the IVIG might be helping a little bit.  The paraneoplastic panel is still pending. It will be interesting see what that shows.  We are still awaiting the analysis for  JC virus. Ultimately, I wonder if we need to consider a brain biopsy. I noticed to be incredibly invasive but it might provide some insight as to what is going on.  I very much appreciate the outstanding care she is getting from everybody up on 14M. You all are doing a fantastic job with her. I can see that she is getting very good and compassionate care.  Lattie Haw, MD  Romans 8:28

## 2016-01-24 NOTE — Progress Notes (Addendum)
Subjective: She feels worse overall. She is unable to tell me in what way. Gets very tearful and easily frustrated.  Exam: Temp:  [97.8 F (36.6 C)-98.3 F (36.8 C)] 98.3 F (36.8 C) (08/28 0955) Pulse Rate:  [77-104] 77 (08/28 0955) Resp:  [18] 18 (08/28 0955) BP: (97-125)/(46-65) 100/65 (08/28 0955) SpO2:  [98 %-100 %] 100 % (08/28 0955)  General Apperance: NAD HEENT: Normocephalic, atraumatic, anicteric sclera Neck: Supple, trachea midline Lungs: Clear to auscultation bilaterally. No wheezes, rhonchi or rales Heart: Regular rate and rhythm, no murmur/rub/gallop Abdomen: Soft, nontender, nondistended, no rebound/guarding Extremities: Warm and well perfused, no edema Skin: No rashes or lesions Neurologic:   Mental Status -  Awake and alert, Oriented to person. (did not know year or place).  Telegraphic speech Was not able to name 1/3 items.  Frequently gets tearful  Cranial Nerves II - XII - II - Visual field intact OU III, IV, VI - Unable to follow command to elicit EOM. V - Facial sensation intact bilaterally. VII - Facial movement intact bilaterally. VIII - Hearing & vestibular intact bilaterally. X - Palate elevates symmetrically. XI - Chin turning & shoulder shrug intact bilaterally. XII - Tongue protrusion intact.  Motor Strength - The patient's strength was normal in all extremities and pronator drift was absent.  Bulk was normal and fasciculations were absent.   Motor Tone - Muscle tone was normal.  Reflexes - The patient's reflexes were 1+ in all extremities.  Sensory - Light touch symmetrical and intact.    Coordination - Ataxia in extremities.  Gait and Station - Deferred  Pertinent Labs/Diagnostics: TSH 1.174 JC virus PCR, CSF negative Vitamin B1 140, Vitamin E 8, Vitamin B12 495 Cryptococcal antigen, CSF negative Cytology, CSF no malignant cells  EEG 8/25 This awake EEG is abnormal due to moderate diffuse slowing of the waking  background.  Impression: 45 year old woman presented with difficulty walking and generalized weakness thought to be subacute progressive cerebellar dysfunction. She received course of IVIG and steroids.  Heme onc following. Considering brain biopsy.  Recommendations: 1) Paraneoplastic CSF panel pending. Lab estimates results will return around Sept 8. 2) Sent vasculitis labs - ANCA, CRP, ESR, RF, SSA, SSB, ANA 3) Continue to follow  Discussed with Dr. Jonelle Sidle, MD  Internal Medicine PGY-3 01/24/2016, 1:07 PM  Neurology Attending Addendum:  This patient was seen, examined, and d/w Dr. Randell Patient. I have reviewed the note and agree with the findings, assessment and plan as documented with the following additions.   I have reviewed her chart at length. According to family at the bedside, her nurse, and her oncologist, there seems to be some slight improvement in her speech today. The patient, however, reports no improvement and actually initially stated that she felt worse today.  Examination is as per the resident note. On my exam, she is encephalopathic and at times becomes tearful. This does limit the examination. She has telegraphic speech. She appears to have hypometric saccades. She has mild dysmetria with finger-to-nose on the left hand but will not attempt this on the right hand.  Labs as per resident note.  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 There remains concern that this could be paraneoplastic in etiology. A paraneoplastic panel is pending. This was confirmed with the lab who indicates that a sample was sent to the [facility] and they anticipate results on or around September 8. I will add some additional labs to screen for underlying autoimmune disease  and these were ordered by the resident as documented in her note. These include ANCA panel, CRP, ESR, rheumatoid factor, anti-SSA antibodies, anti-SSB antibodies, and ANA.  Unfortunately she has not demonstrated significant response to either steroids or IVIG. This lack of response to therapy would be more suggestive of a paraneoplastic etiology. Continue supportive care otherwise.

## 2016-01-24 NOTE — Progress Notes (Signed)
Physical Therapy Treatment Patient Details Name: [name] MRN: [id number] DOB: 12/05/1970 Today's Date: 01/24/2016    History of Present Illness [name] is a 45 yo female who was admitted with generalized weakness, difficulty walking, and thoracic pain.  PMH includes non-hodgkin's lymphoma, HSV-2, cerebellar ataxia, anxiety and depression.    PT Comments    Patient more alert and responsive today and is making gradual progress toward mobility goals. Continues to require mod/max A +2 for functional mobility and continues to have episodes of crying throughout session. Continue to progress as tolerated with anticipated d/c to SNF for further skilled PT services.      Follow Up Recommendations  SNF;Supervision/Assistance - 24 hour     Equipment Recommendations  None recommended by PT    Recommendations for Other Services       Precautions / Restrictions Precautions Precautions: Fall Restrictions Weight Bearing Restrictions: No    Mobility  Bed Mobility Overal bed mobility: Needs Assistance Bed Mobility: Supine to Sit     Supine to sit: Mod assist;Max assist     General bed mobility comments: mod A to elevate trunk into sitting and max to scoot hips toward EOB iwth use of bed pad; pt with decreased initiation of tasks and required multimodal cues   Transfers Overall transfer level: Needs assistance Equipment used: 2 person hand held assist Transfers: Sit to/from Stand Sit to Stand: Mod assist;+2 safety/equipment         General transfer comment: assist to maintain balance upon standing  Ambulation/Gait Ambulation/Gait assistance: Max assist;+2 physical assistance;+2 safety/equipment Ambulation Distance (Feet): 80 Feet (40X2; one seated rest break) Assistive device: 2 person hand held assist (+3 for chair follow) Gait Pattern/deviations: Step-to pattern;Decreased step length - left;Decreased stride length;Shuffle;Ataxic;Narrow base of support Gait  velocity: slow   General Gait Details: assist to maintain balance and advance bilat LE initially with tactile cues at glutes; pt with shuffling gait at times and with more narrow BOS as she fatigued; pt began leaning posteriorly and arching back during ambulation but only for short distance   Stairs            Wheelchair Mobility    Modified Rankin (Stroke Patients Only)       Balance     Sitting balance-Leahy Scale: Fair       Standing balance-Leahy Scale: Poor                      Cognition Arousal/Alertness: Awake/alert Behavior During Therapy: Flat affect (episodes of crying) Overall Cognitive Status: Impaired/Different from baseline Area of Impairment: Orientation;Attention;Following commands;Safety/judgement;Awareness;Problem solving Orientation Level: Disoriented to;Place;Time;Situation Current Attention Level: Sustained   Following Commands: Follows one step commands with increased time;Follows one step commands consistently Safety/Judgement: Decreased awareness of safety;Decreased awareness of deficits Awareness: Intellectual Problem Solving: Slow processing;Decreased initiation;Requires verbal cues;Requires tactile cues General Comments: pt more alert and responsive this session    Exercises      General Comments General comments (skin integrity, edema, etc.): mother present for session      Pertinent Vitals/Pain Pain Assessment: No/denies pain    Home Living                      Prior Function            PT Goals (current goals can now be found in the care plan section) Acute Rehab PT Goals Patient Stated Goal: go home PT Goal Formulation: Patient unable to participate  in goal setting Time For Goal Achievement: 01/25/16 Potential to Achieve Goals: Fair Progress towards PT goals: Progressing toward goals    Frequency  Min 3X/week    PT Plan Current plan remains appropriate    Co-evaluation             End of  Session Equipment Utilized During Treatment: Gait belt Activity Tolerance: Patient tolerated treatment well Patient left: with nursing/sitter in room;in bed;with call bell/phone within reach;with family/visitor present;with bed alarm set     Time: 1359-1425 PT Time Calculation (min) (ACUTE ONLY): 26 min  Charges:  $Gait Training: 8-22 mins $Therapeutic Activity: 8-22 mins                    G Codes:      Salina April, PTA Pager: [phone]   01/24/2016, 3:15 PM

## 2016-01-25 DIAGNOSIS — W19XXXA Unspecified fall, initial encounter: Secondary | ICD-10-CM

## 2016-01-25 LAB — GLUCOSE, CAPILLARY
GLUCOSE-CAPILLARY: 119 mg/dL — AB (ref 65–99)
GLUCOSE-CAPILLARY: 105 mg/dL — AB (ref 65–99)
GLUCOSE-CAPILLARY: 110 mg/dL — AB (ref 65–99)
Glucose-Capillary: 121 mg/dL — ABNORMAL HIGH (ref 65–99)

## 2016-01-25 LAB — COMPREHENSIVE METABOLIC PANEL
ALBUMIN: 3.5 g/dL (ref 3.5–5.0)
ALK PHOS: 67 U/L (ref 38–126)
ALT: 130 U/L — AB (ref 14–54)
AST: 84 U/L — AB (ref 15–41)
Anion gap: 7 (ref 5–15)
BILIRUBIN TOTAL: 0.6 mg/dL (ref 0.3–1.2)
CALCIUM: 9.3 mg/dL (ref 8.9–10.3)
CO2: 26 mmol/L (ref 22–32)
CREATININE: 0.51 mg/dL (ref 0.44–1.00)
Chloride: 100 mmol/L — ABNORMAL LOW (ref 101–111)
GFR calc Af Amer: >60 mL/min (ref >60)
GLUCOSE: 117 mg/dL — AB (ref 65–99)
Potassium: 3.7 mmol/L (ref 3.5–5.1)
Sodium: 133 mmol/L — ABNORMAL LOW (ref 135–145)
TOTAL PROTEIN: 6.7 g/dL (ref 6.5–8.1)

## 2016-01-25 LAB — ANCA TITERS
Atypical P-ANCA titer: <1:20 {titer}
P-ANCA: <1:20 {titer}

## 2016-01-25 LAB — ANTINUCLEAR ANTIBODIES, IFA: ANTINUCLEAR ANTIBODIES, IFA: NEGATIVE

## 2016-01-25 LAB — RHEUMATOID FACTOR

## 2016-01-25 LAB — MPO/PR-3 (ANCA) ANTIBODIES: ANCA Proteinase 3: <3.5 U/mL (ref 0.0–3.5)

## 2016-01-25 LAB — SJOGRENS SYNDROME-B EXTRACTABLE NUCLEAR ANTIBODY: SSB (LA) (ENA) ANTIBODY, IGG: 0.2 AI (ref 0.0–0.9)

## 2016-01-25 LAB — SJOGRENS SYNDROME-A EXTRACTABLE NUCLEAR ANTIBODY: SSA (RO) (ENA) ANTIBODY, IGG: 1 AI — AB (ref 0.0–0.9)

## 2016-01-25 MED ORDER — IBUPROFEN 200 MG PO TABS
600.0000 mg | ORAL_TABLET | Freq: Three times a day (TID) | ORAL | Status: DC
  Administered 2016-01-25 – 2016-01-26 (×6): 600 mg via ORAL
  Filled 2016-01-25 (×6): qty 3

## 2016-01-25 NOTE — Progress Notes (Signed)
OT Cancellation Note  Patient Details Name: [name] MRN: [id number] DOB: 09-20-1970   Cancelled Treatment:    Reason Eval/Treat Not Completed: Fatigue/lethargy limiting ability to participate;Patient declined, no reason specified;Other (comment). Pt very restless this afternoon with c/o of back pain and not being able to get comfortable. Pt's mother states that pt has not been having a good day and nothing has helped her back discomfort  Britt Bottom 01/25/2016, 1:49 PM

## 2016-01-25 NOTE — Progress Notes (Signed)
Subjective: No acute events overnight. Feels the same. Has back pain today. No new complaint otherwise.  Exam: Temp:  [98.3 F (36.8 C)-98.6 F (37 C)] 98.5 F (36.9 C) (08/29 0049) Pulse Rate:  [73-92] 81 (08/29 0049) Resp:  [18] 18 (08/29 0049) BP: (100-117)/(56-70) 117/56 (08/29 0049) SpO2:  [95 %-100 %] 100 % (08/29 0049)  General Apperance: NAD HEENT: Normocephalic, atraumatic, anicteric sclera Neck: Supple, trachea midline Lungs: Clear to auscultation bilaterally. No wheezes, rhonchi or rales Heart: Regular rate and rhythm, no murmur/rub/gallop Abdomen: Soft, nontender, nondistended, no rebound/guarding Extremities: Warm and well perfused, no edema Skin: No rashes or lesions Neurologic:   Mental Status -  Awake and alert, Oriented to person. (did not know year or place).  Telegraphic speech Was not able to name 1/3 items.  Emotionally labile  Cranial Nerves II - XII - II - Visual field intact OU III, IV, VI - Unable to follow command to elicit EOM. V - Facial sensation intact bilaterally. VII - Facial movement intact bilaterally. VIII - Hearing & vestibular intact bilaterally. X - Palate elevates symmetrically. XI - Chin turning & shoulder shrug intact bilaterally. XII - Tongue protrusion intact.  Motor Strength - The patient's strength was normal in all extremities and pronator drift was absent.  Bulk was normal and fasciculations were absent.   Motor Tone - Muscle tone was normal.  Reflexes - The patient's reflexes were 1+ in all extremities.  Sensory - Light touch symmetrical and intact.    Coordination - Ataxia in extremities.  Gait and Station - Deferred  Pertinent Labs/Diagnostics: TSH 1.174 JC virus PCR, CSF negative Vitamin B1 140, Vitamin E 8, Vitamin B12 495 Cryptococcal antigen, CSF negative Cytology, CSF no malignant cells CRP <0.5 ESR 70 RF negative  EEG 8/25 This awake EEG is abnormal due to moderate diffuse slowing of the waking  background.  ANCA panel pending SSA, SSB pending ANA pending  Impression: 45 year old woman presented with difficulty walking and generalized weakness thought to be subacute progressive cerebellar dysfunction. She received course of IVIG and steroids.  Recommendations: 1) Paraneoplastic CSF panel pending. Lab estimates results will return around Sept 8. 2) ANCA, SSA/SSB, ANA pending 3) Continue supportive care 4) May consider d/c to SNF from neurologic standpoint. 5) Added ibuprofen '600mg'$  q8hr for back pain  Discussed with Dr. Jonelle Sidle, MD  Internal Medicine PGY-3 01/25/2016, 8:14 AM  Neurology Attending Addendum  This patient was seen, examined, and d/w Dr. Randell Patient. I have reviewed the note and agree with the findings, assessment and plan as documented with the following additions.   No change in the patient's condition. She is not reporting any new neurologic symptoms. She is complaining of low back pain today. Her examination is unchanged with ongoing telegraphic speech and dysmetria with finger to nose. She is emotionally labile, breaking into tears easily. Affect is otherwise restricted.  Pertinent labs: ESR is elevated at 70. CRP normal. Rheumatoid factor negative. Pending studies include ANA, anti-SSA antibodies, anti-SSB antibodies, and ANCA panel.  Impression: 1. Autoimmune cerebellar syndrome  2. Dysmetria 3. Dysarthria  Recommendations: As per Dr. Marjory Sneddon note. At this point, we continue to wait for labs to return. This will likely take at least another week. No additional testing or treatment is planned at this point pending these results. From the neurology perspective, it would be reasonable to discharge her to a skilled nursing facility with follow-up with her outpatient neurologist moving forward. This was discussed with the case manager  on the unit.

## 2016-01-25 NOTE — Progress Notes (Addendum)
Patient ID: [name], female   DOB: [birth year], 45 y.o.   MRN: [id number]                                                                PROGRESS NOTE                                                                                                                                                                                                             Patient Demographics:    [name], is a 45 y.o. female, DOB - 1970-11-01, NKN:[id number]  Admit date - 01/12/2016   Admitting Physician Theodis Blaze, MD  Outpatient Primary MD for the patient is Tammi Sou, MD  LOS - 12  Outpatient Specialists: Ulyses Jarred (neurologist)  Chief Complaint  Patient presents with  . Weakness       Brief Narrative  45 y.o. female, w difficulty with walking as well as generalized weakness x 4 weeks pt has been having increase in falls. MRI brain 8/18 => Interval progression of white matter signal abnormality, greatest in the pons, compared to the study 1 month earlier. 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    Subjective:    [name]  patient complaining of back pain unable to get comfortable in bed   Assessment  & Plan :    Principal Problem:   Weakness Active Problems:   Ataxia   Gait abnormality   Fever   Fall  Ataxia/Gait problems with subacute progressive cerebral dysfunction - MRI brain from 01/14/16 shows abnormal T2 signal in the pons . 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 Paraneoplastic panel still pending, send out to [facility]. 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 ANCA panel pending SSA, SSB pending ANA pending Disposition-pending further workup , prognosis from hematology oncology and urology   H/o recurrent follicular continuous lymphoma (non-Hodgkin's) Patient was treated with Gazyva/bendamustine. She had 4 cycles of chemotherapy. Her chemotherapy finished back in October 2016. She got 2 additional cycles of Gazyva. This was last given back in January - Neurology input greatly appreciated, CT scans with no evidence of lymphoma recurrence, PET scan 2 months with no evidence of recurrence, LP with no evidence of malignant cells , bone marrow biopsy 8/22 per Dr. Marin Olp, no evidence of malignancy  Hypokalemia - Repleted 8/26, recheck this AM pending.  Fever - Patient with continued low grade fever this morning, no leukocytosis, chest x-ray with no evidence of infiltrate, urinalysis unremarkable as well  Elevated AST/ALT Repeat LFTs unchanged  Code Status : Full  Family communication: D/W family friend ,by the bedside   Disposition Plan : Likely will need SNF placement, but not ready for discharge yet, pending further workup  Consults  :  Oncology/neurology  DVT Prophylaxis  :  Heparin   Lab Results  Component Value Date   PLT 220 01/22/2016    Antibiotics  :   Anti-infectives    None        Objective:   Vitals:   01/24/16 1742 01/24/16 2207 01/25/16 0049 01/25/16 0849  BP: 109/69 109/70 (!) 117/56 114/61  Pulse:  73 92 81 90  Resp: _0 Temp: 98.5 F (36.9 C) 98.3 F (36.8 C) 98.5 F (36.9 C) 98.4 F (36.9 C)  TempSrc: Oral Oral Oral Oral  SpO2: 95% 96% 100% 99%  Weight:      Height:        Wt Readings from Last 3 Encounters:  01/22/16 48.4 kg (106 lb 12.8 oz)  12/30/15 48.9 kg (107 lb 12.8 oz)  12/27/15 48.8 kg (107 lb 8 oz)     Intake/Output Summary (Last 24 hours) at 01/25/16 1117 Last data filed at 01/25/16 0200  Gross per 24 hour  Intake             1320 ml  Output                0 ml  Net             1320 ml     Physical Exam  Awake, but not oriented, more calm and conversant today. Supple Neck,No JV Symmetrical Chest wall movement, Good air movement bilaterally, CTAB RRR,No Gallops,Rubs or new Murmurs, No Parasternal Heave +ve B.Sounds, Abd Soft, No tenderness, No rebound - guarding or rigidity. No Cyanosis, Clubbing or edema, No new Rash or bruise  , poor coordination, dysmetria.    Data Review:    CBC  Recent Labs Lab 01/21/16 0907 01/22/16 0515  WBC 6.3 6.1  HGB 11.0* 11.6*  HCT 32.8* 35.4*  PLT 214 220  MCV 88.9 89.6  MCH 29.8 29.4  MCHC 33.5 32.8  RDW 13.2 12.9  LYMPHSABS 0.7  --   MONOABS 0.7  --   EOSABS 0.0  --   BASOSABS 0.0  --     Chemistries   Recent Labs Lab 01/20/16 0545 01/22/16 0515 01/22/16 5784 01/23/16 1146 01/24/16 0949 01/25/16 0524  NA 132* 135 134* 135  --  133*  K 3.6 2.8* 2.9* 3.4*  --  3.7  CL 100* 100* 103 105  --  100*  CO2 _0 --  26  GLUCOSE 127* 116* 144* 108*  --  117*  BUN 12 <5* <5* 6  --  <5*  CREATININE 0.54 0.55 0.61 0.54  --  0.51  CALCIUM 9.2 9.1 9.0 8.7*  --  9.3  MG  --   --  2.0  --   --   --   AST  --   --  67*  --  82* 84*  ALT  --   --  110*  --  117* 130*  ALKPHOS  --   --  63  --  58 67  BILITOT  --   --  0.7  --  0.4 0.6   ------------------------------------------------------------------------------------------------------------------ No results for input(s): CHOL,  HDL, LDLCALC, TRIG, CHOLHDL, LDLDIRECT in the last 72 hours.  No results found for: HGBA1C ------------------------------------------------------------------------------------------------------------------  Recent Labs  01/23/16 1146  TSH 1.174   ------------------------------------------------------------------------------------------------------------------ No results for input(s): VITAMINB12, FOLATE, FERRITIN, TIBC, IRON, RETICCTPCT in the last 72 hours.  Coagulation profile No results for input(s): INR, PROTIME in the last 168 hours.  No results for input(s): DDIMER in the last 72 hours.  Cardiac Enzymes No results for input(s): CKMB, TROPONINI, MYOGLOBIN in the last 168 hours.  Invalid input(s): CK ------------------------------------------------------------------------------------------------------------------ No results found for: BNP  Inpatient Medications  Scheduled Meds: . heparin subcutaneous  5,000 Units Subcutaneous Q8H  . ibuprofen  600 mg Oral TID  . nicotine  14 mg Transdermal Daily  . pantoprazole  40 mg Oral Daily  . polyethylene glycol  17 g Oral Daily  . potassium chloride SA  20 mEq Oral Daily  . sodium chloride flush  3 mL Intravenous Q12H  . thiamine  100 mg Oral Daily  . Vitamin D (Ergocalciferol)  50,000 Units Oral Weekly   Continuous Infusions: . dextrose 5 % and 0.9 % NaCl with KCl 40 mEq/L 75 mL/hr at 01/24/16 1800  . lactated ringers Stopped (01/24/16 1800)   PRN Meds:.acetaminophen **OR** acetaminophen, bisacodyl, LORazepam, mineral oil, traMADol  Micro Results No results found for this or any previous visit (from the past 240 hour(s)).  Radiology Reports Ct Head Wo Contrast  Result Date: 01/13/2016 CLINICAL DATA:  45 y/o F; increased weakness and difficulty with speech mobility over the last 3 weeks. History of non-Hodgkin's lymphoma. EXAM: CT HEAD WITHOUT CONTRAST TECHNIQUE: Contiguous axial images were obtained from the base of the  skull through the vertex without intravenous contrast. COMPARISON:  MRI of the brain dated 12/20/2015. FINDINGS: Brain: No evidence of acute infarction, hemorrhage, hydrocephalus, extra-axial collection or mass lesion/mass effect. Moderate generalized atrophy for patient's age. Few lucencies in white matter and within the pons corresponding FLAIR signal abnormality on prior MRI and are nonspecific. Vascular: No hyperdense vessel or unexpected calcification. Skull: No fracture is identified.  No suspicious lesion. Sinuses/Orbits: No acute finding. Other: No unexpected finding. IMPRESSION: No acute intracranial abnormality is identified. Stable generalized volume loss and mild nonspecific white matter lucencies possibly representing microvascular disease or posttreatment changes. Electronically Signed   By: Kristine Garbe M.D.   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 Staging. Subsequent encounter. EXAM: CT NECK WITH CONTRAST TECHNIQUE: Multidetector CT imaging of the  neck was performed using the standard protocol following the bolus administration of intravenous contrast. CONTRAST:  19m ISOVUE-300 IOPAMIDOL (ISOVUE-300) INJECTION 61% in conjunction with contrast enhanced imaging of the chest, abdomen, and pelvis reported separately. COMPARISON:  Brain MRI 01/14/2016. Head CT without contrast 01/13/2016. PET-CT 11/03/2015. FINDINGS: Pharynx and larynx: Larynx is normal in best seen on series 6, image 70. Pharyngeal soft tissue contours are within normal limits. Negative parapharyngeal and retropharyngeal spaces. Salivary glands: Negative sublingual space, sublingual glands, submandibular glands, and parotid glands. Thyroid: Negative. Lymph nodes: No cervical lymphadenopathy. Only diminutive (4 mm short axis or smaller) bilateral lymph nodes are identified. Vascular: Major vascular  structures in the neck and at skullbase are patent and appear normal. Limited intracranial: Stable from yesterday brain MRI. Visualized orbits: Negative. Mastoids and visualized paranasal sinuses: Visualized paranasal sinuses and mastoids are stable and well pneumatized. Skeleton: No osseous abnormality identified. Upper chest: Chest CT is reported separately today. IMPRESSION: 1. Negative neck CT.  No evidence of lymphoma in the neck. 2. CT chest abdomen and pelvis today reported separately. Electronically Signed   By: HGenevie AnnM.D.   On: 01/15/2016 18:23   Ct Chest W Contrast  Result Date: 01/15/2016 CLINICAL DATA:  Recurrent lymphoma. EXAM: CT CHEST, ABDOMEN, AND PELVIS WITH CONTRAST TECHNIQUE: Multidetector CT imaging of the chest, abdomen and pelvis was performed following the standard protocol during bolus administration of intravenous contrast. CONTRAST:  1036mISOVUE-300 IOPAMIDOL (ISOVUE-300) INJECTION 61% COMPARISON:  None. FINDINGS: CT CHEST FINDINGS Cardiovascular: The heart and great vessels are normal. Mediastinum/Nodes: Normal.  No adenopathy. Lungs/Pleura: Minimal linear opacity anteriorly in the right lung on series 3, image 61 is stable and of doubtful significance. This may represent a tiny region of scar. The lungs are otherwise normal. Musculoskeletal: Normal. CT ABDOMEN PELVIS FINDINGS Hepatobiliary: Normal. Pancreas: Normal. Spleen: Normal. Adrenals/Urinary Tract: Normal. Stomach/Bowel: The stomach and small bowel are normal. Moderate fecal loading is seen throughout the colon. The colon is otherwise normal. The cecum is seen in the right side of the pelvis. Visualized portions of the appendix are normal. Vascular/Lymphatic: No aneurysm or dissection.  No adenopathy. Reproductive: The patient is status post tubal ligation. The uterus and adnexae are normal. Other: No other abnormalities. Musculoskeletal: No acute bony abnormalities. IMPRESSION: 1. No evidence of lymphoma recurrence in the  chest, abdomen, or pelvis. 2. Moderate fecal loading throughout the colon. Electronically Signed   By: DaDorise BullionII M.D   On: 01/15/2016 18:30   Mr Brain W Wo Contrast  Result Date: 01/14/2016 CLINICAL DATA:  Recurrent follicular cutaneous lymphoma treated with chemotherapy. Recent PET scan was negative for disease. Recent onset of difficulty walking and speech issues. 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 EXAM: MRI HEAD WITHOUT AND WITH CONTRAST TECHNIQUE: Multiplanar, multiecho pulse sequences of the brain and surrounding structures were obtained without and with intravenous contrast. CONTRAST:  1014mULTIHANCE GADOBENATE DIMEGLUMINE 529 MG/ML IV SOLN COMPARISON:  12/20/2015. FINDINGS: No acute stroke or hemorrhage.  No mass lesion or extra-axial fluid. Global atrophy, hydrocephalus ex vacuo. Significant premature brain substance loss affecting the cerebral hemispheres and cerebellum. Premature for age subcortical greater than periventricular white matter signal abnormality, also involving the pons. No definite involvement of the cerebellar white matter. There is moderate interval progression of the T2 and FLAIR hyperintensity in the pons, and to a lesser degree, progression in the subcortical white matter, compared with the study 1 month earlier. These show no restriction, susceptibility, or postcontrast enhancement. Partial empty  sella. Flow voids are maintained. Extracranial soft tissues are unremarkable. Post infusion imaging through the entire head demonstrates no abnormal enhancement of brain or meninges. IMPRESSION: Interval progression of white matter signal abnormality, greatest in the pons, compared to the study 1 month earlier. These white matter lesions show no restriction, susceptibility, or postcontrast enhancement. 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 Global atrophy affecting the cerebral hemispheres,  brainstem, and cerebellum, stable from priors. No areas of restricted diffusion or postcontrast enhancement. Electronically Signed   By: Staci Righter M.D.   On: 01/14/2016 18:37   Ct Abdomen Pelvis W Contrast  Result Date: 01/15/2016 CLINICAL DATA:  Recurrent lymphoma. EXAM: CT CHEST, ABDOMEN, AND PELVIS WITH CONTRAST TECHNIQUE: Multidetector CT imaging of the chest, abdomen and pelvis was performed following the standard protocol during bolus administration of intravenous contrast. CONTRAST:  121m ISOVUE-300 IOPAMIDOL (ISOVUE-300) INJECTION 61% COMPARISON:  None. FINDINGS: CT CHEST FINDINGS Cardiovascular: The heart and great vessels are normal. Mediastinum/Nodes: Normal.  No adenopathy. Lungs/Pleura: Minimal linear opacity anteriorly in the right lung on series 3, image 61 is stable and of doubtful significance. This may represent a tiny region of scar. The lungs are otherwise normal. Musculoskeletal: Normal. CT ABDOMEN PELVIS FINDINGS Hepatobiliary: Normal. Pancreas: Normal. Spleen: Normal. Adrenals/Urinary Tract: Normal. Stomach/Bowel: The stomach and small bowel are normal. Moderate fecal loading is seen throughout the colon. The colon is otherwise normal. The cecum is seen in the right side of the pelvis. Visualized portions of the appendix are normal. Vascular/Lymphatic: No aneurysm or dissection.  No adenopathy. Reproductive: The patient is status post tubal ligation. The uterus and adnexae are normal. Other: No other abnormalities. Musculoskeletal: No acute bony abnormalities. IMPRESSION: 1. No evidence of lymphoma recurrence in the chest, abdomen, or pelvis. 2. Moderate fecal loading throughout the colon. Electronically Signed   By: DDorise BullionIII M.D   On: 01/15/2016 18:30   Ct Biopsy  Result Date: 01/18/2016 CLINICAL DATA:  History of cutaneous lymphoma. Bone marrow biopsy requested to assess for any evidence of lymphoma recurrence. EXAM: CT GUIDED BONE MARROW ASPIRATION AND CORE BIOPSY  ANESTHESIA/SEDATION: Versed 2.0 mg IV, Fentanyl 100 mcg IV Total Moderate Sedation Time 20 minutes. The patient's level of consciousness and physiologic status were continuously monitored during the procedure by Radiology nursing. PROCEDURE: The procedure risks, benefits, and alternatives were explained to the patient's mother. Questions regarding the procedure were encouraged and answered. The patient's mother understands and consents to the procedure. A time-out was performed prior to the procedure. The right gluteal region was prepped with Betadine. Sterile gown and sterile gloves were used for the procedure. Local anesthesia was provided with 1% Lidocaine. Under CT guidance, an 11 gauge OnControl bone cutting needle was advanced from a posterior approach into the right iliac bone. Needle positioning was confirmed with CT. Initial non heparinized and heparinized aspirate samples were obtained of bone marrow. Core biopsy was performed with the 11 gauge needle. COMPLICATIONS: None FINDINGS: Inspection of initial aspirate did reveal visible particles. Intact core biopsy sample was obtained. IMPRESSION: CT guided bone marrow biopsy of right posterior iliac bone with both aspirate and core samples obtained. Electronically Signed   By: GAletta EdouardM.D.   On: 01/18/2016 11:07   Dg Chest Port 1 View  Result Date: 01/21/2016 CLINICAL DATA:  Fever, confusion, history of cutaneous non-Hodgkin's lymphoma EXAM: PORTABLE CHEST 1 VIEW COMPARISON:  CT scan of the chest of January 15, 2016 FINDINGS: The lungs are adequately inflated. There  is no focal infiltrate. There is no pleural effusion. The heart and pulmonary vascularity are normal. The mediastinum is normal in width. The bony thorax is unremarkable. IMPRESSION: There is no active cardiopulmonary disease. Electronically Signed   By: David  Martinique M.D.   On: 01/21/2016 08:48   Dg Hip Unilat With Pelvis 2-3 Views Left  Result Date: 01/13/2016 CLINICAL DATA:  45  y/o  F; status post fall with anterior hip pain. EXAM: DG HIP (WITH OR WITHOUT PELVIS) 2-3V LEFT COMPARISON:  None. FINDINGS: There is no evidence of hip fracture or dislocation. There is no evidence of arthropathy or other focal bone abnormality. Clips project over the pelvis. IMPRESSION: No acute fracture or dislocation is identified. Electronically Signed   By: Kristine Garbe M.D.   On: 01/13/2016 02:07      Makhayla Mcmurry M.D on 01/25/2016 at 11:17 AM  Between 7am to 7pm - Pager - [phone]  After 7pm go to [email or web address] - password [facility]  [facility] -  Office  [phone]

## 2016-01-26 DIAGNOSIS — R278 Other lack of coordination: Secondary | ICD-10-CM

## 2016-01-26 DIAGNOSIS — R471 Dysarthria and anarthria: Secondary | ICD-10-CM

## 2016-01-26 LAB — GLUCOSE, CAPILLARY
GLUCOSE-CAPILLARY: 103 mg/dL — AB (ref 65–99)
GLUCOSE-CAPILLARY: 112 mg/dL — AB (ref 65–99)
Glucose-Capillary: 137 mg/dL — ABNORMAL HIGH (ref 65–99)
Glucose-Capillary: 124 mg/dL — ABNORMAL HIGH (ref 65–99)

## 2016-01-26 LAB — CHROMOSOME ANALYSIS, BONE MARROW

## 2016-01-26 MED ORDER — POLYETHYLENE GLYCOL 3350 17 G PO PACK: 17.0000 g | PACK | Freq: Every day | ORAL | 0 refills | Status: AC

## 2016-01-26 MED ORDER — PANTOPRAZOLE SODIUM 40 MG PO TBEC: 40.0000 mg | DELAYED_RELEASE_TABLET | Freq: Every day | ORAL | 0 refills | Status: AC

## 2016-01-26 MED ORDER — BISACODYL 10 MG RE SUPP: 10.0000 mg | Freq: Every day | RECTAL | 0 refills | Status: AC | PRN

## 2016-01-26 MED ORDER — LORAZEPAM 1 MG PO TABS: 2.0000 mg | ORAL_TABLET | Freq: Three times a day (TID) | ORAL | 0 refills | Status: DC | PRN

## 2016-01-26 MED ORDER — TRAMADOL HCL 50 MG PO TABS: 50.0000 mg | ORAL_TABLET | Freq: Three times a day (TID) | ORAL | 1 refills | Status: DC | PRN

## 2016-01-26 MED ORDER — THIAMINE HCL 100 MG PO TABS: 100.0000 mg | ORAL_TABLET | Freq: Every day | ORAL | 1 refills | Status: DC

## 2016-01-26 MED ORDER — NICOTINE 14 MG/24HR TD PT24: 14.0000 mg | MEDICATED_PATCH | Freq: Every day | TRANSDERMAL | 0 refills | Status: DC

## 2016-01-26 NOTE — Progress Notes (Signed)
Called by Neuro CNS and RNCM to assist with information and support for [name] mother who is the primary decision maker and caregiver of the 2 children.  Brief meeting in consult room with [name], patients mother.  She is clearly overwhelmed with the situation.  She has made arrangements for an attorney to complete paperwork tomorrow, arranging for financial and guardianship of the minor children.  This is a very unfortunate, complicated situtation not only related to diagnosis and prognosis but included is the death of the patients husband (father of the children) less than 4 months ago.  I committed to offering support with decision making after the meeting with Dr. Allyson Sabal tomorrow morning.  [name] did mention hospice and it appears that she has had some thoughts regarding potential options.    Plan to support [name] and the extended family by providing referral for support services as desired.  Kizzie Fantasia, MSN, RN-BC, [facility] Palliative Care Clinical Support

## 2016-01-26 NOTE — Progress Notes (Signed)
Subjective: No acute events overnight. Doing better today. Back pain has improved.  Exam: Temp:  [97.7 F (36.5 C)-98.4 F (36.9 C)] 98.4 F (36.9 C) (08/30 0614) Pulse Rate:  [62-97] 81 (08/30 0614) Resp:  [16-18] 16 (08/30 0614) BP: (99-126)/(43-66) 105/55 (08/30 0614) SpO2:  [99 %-100 %] 100 % (08/30 0614)  General Apperance: NAD HEENT: Normocephalic, atraumatic, anicteric sclera Neck: Supple, trachea midline Lungs: Clear to auscultation bilaterally. No wheezes, rhonchi or rales Heart: Regular rate and rhythm, no murmur/rub/gallop Abdomen: Soft, nontender, nondistended, no rebound/guarding Extremities: Warm and well perfused, no edema Skin: No rashes or lesions Neurologic:   Mental Status -  Awake and alert, Oriented to person. (did not know year or place).  Telegraphic speech Was not able to name 1/3 items.  Emotionally labile  Cranial Nerves II - XII - II - Visual field intact OU III, IV, VI - Unable to follow command to elicit EOM. V - Facial sensation intact bilaterally. VII - Facial movement intact bilaterally. VIII - Hearing & vestibular intact bilaterally. X - Palate elevates symmetrically. XI - Chin turning & shoulder shrug intact bilaterally. XII - Tongue protrusion intact.  Motor Strength - The patient's strength was normal in all extremities and pronator drift was absent.  Bulk was normal and fasciculations were absent.   Motor Tone - Muscle tone was normal.  Reflexes - The patient's reflexes were 1+ in all extremities.  Sensory - Light touch symmetrical and intact.    Coordination - Ataxia in extremities.  Gait and Station - Deferred  Pertinent Labs/Diagnostics: TSH 1.174 JC virus PCR, CSF negative Vitamin B1 140, Vitamin E 8, Vitamin B12 495 Cryptococcal antigen, CSF negative Cytology, CSF no malignant cells CRP <0.5 ESR 70 RF negative ANCA panel negative SSA 1, SSB 0.2 ANA negative  EEG 8/25 This awake EEG is abnormal due to moderate  diffuse slowing of the waking background.  Impression: 45 year old woman presented with difficulty walking and generalized weakness thought to be subacute progressive cerebellar dysfunction. She received course of IVIG and steroids. SSA weakly positive and SSB negative. ANA negative.  Recommendations: 1) Paraneoplastic CSF panel pending. Lab estimates results will return around Sept 8. 2) Continue supportive care 3) May consider d/c to SNF from neurologic standpoint. 4) weakly positive SSA - consider retesting as outpatient  Discussed with Dr. Jonelle Sidle, MD  Internal Medicine PGY-3 01/26/2016, 9:07 AM  Neurology Attending Addendum  This patient was seen, examined, and d/w Dr. Randell Patient. I have reviewed the note and agree with the findings, assessment and plan as documented with the following additions.   No overnight events, no new issues. Patient is without complaints at this time.   Exam continues to show telegraphic speech, nystagmus, dysmetria, and emotional lability.   Impression: 1. Autoimmune cerebellar syndrome 2. Dysmetria 3. Dysarthria  Recs: As per Dr. Marjory Sneddon note. Placement pending. Waiting for labs. Will follow peripherally at this point. Call if any new issues arise.

## 2016-01-26 NOTE — Progress Notes (Signed)
Physical Therapy Treatment Patient Details Name: [name] MRN: [id number] DOB: [birth year] Today's Date: 01/26/2016    History of Present Illness [name] is a 45 yo female who was admitted with generalized weakness, difficulty walking, and thoracic pain.  PMH includes non-hodgkin's lymphoma, HSV-2, cerebellar ataxia, anxiety and depression.    PT Comments    Patient alert this session however not communicating or following commands as she did last session. Pt maintained flexed bilat UE throughout session as she demonstrated last week which is also different from last session on 8/28. Current plan remains appropriate.   Follow Up Recommendations  SNF;Supervision/Assistance - 24 hour     Equipment Recommendations  None recommended by PT    Recommendations for Other Services       Precautions / Restrictions Precautions Precautions: Fall Restrictions Weight Bearing Restrictions: No    Mobility  Bed Mobility Overal bed mobility: Needs Assistance Bed Mobility: Supine to Sit     Supine to sit: Max assist;+2 for physical assistance;HOB elevated     General bed mobility comments: max multimodal cues for hand placement and sequencing; hand over hand assist for pt to use bilat UE for sitting balance once EOB; assist to bring bilat LE to EOB, elevate trunk into sitting, and scoot hips to EOB  Transfers Overall transfer level: Needs assistance Equipment used: 2 person hand held assist Transfers: Sit to/from Stand Sit to Stand: Max assist;+2 physical assistance         General transfer comment: from EOB and BSC; multimodal cues for hand placment and initiation of transfer; assist to power up into standing and maintain balance upon stand; when pt comes into stand she takes a step forward each time   Ambulation/Gait Ambulation/Gait assistance: Max assist;+2 physical assistance Ambulation Distance (Feet):  (~49ft with seated break) Assistive device: 2 person hand  held assist Gait Pattern/deviations: Step-through pattern;Decreased stride length;Shuffle;Narrow base of support Gait velocity: slow   General Gait Details: pt with varying gait from step through with very short step lengths to shuffling gait and with almost scissoring at times; pt maintained very narrow BOS throughout; pt required max +2 for balance and facilitation at glutes to advance bilat LE at times; pt with uncoordinated bilat LE movements and with arching back    Stairs            Wheelchair Mobility    Modified Rankin (Stroke Patients Only)       Balance     Sitting balance-Leahy Scale: Poor Sitting balance - Comments: mod/max A to maintain sitting balance     Standing balance-Leahy Scale: Zero                      Cognition Arousal/Alertness: Awake/alert Behavior During Therapy: Flat affect (episodes of crying) Overall Cognitive Status: Impaired/Different from baseline Area of Impairment: Attention;Following commands;Safety/judgement;Awareness;Problem solving   Current Attention Level: Focused   Following Commands: Follows one step commands with increased time;Follows one step commands inconsistently Safety/Judgement: Decreased awareness of safety;Decreased awareness of deficits Awareness: Intellectual Problem Solving: Slow processing;Decreased initiation;Requires verbal cues;Requires tactile cues General Comments: pt presented cognitively much like session from last Friday and was less responsive verbally and followed commands inconsistently which is different from her responsiveness 2 days ago; pt initiated tasks with increased time and with inconsistently however had difficulty attending to tasks    Exercises      General Comments        Pertinent Vitals/Pain Pain Assessment: Faces Faces  Pain Scale: No hurt Pain Intervention(s): Monitored during session    Home Living                      Prior Function            PT Goals  (current goals can now be found in the care plan section) Acute Rehab PT Goals Patient Stated Goal: none stated  PT Goal Formulation: Patient unable to participate in goal setting Time For Goal Achievement: 01/25/16 Potential to Achieve Goals: Fair Progress towards PT goals: Not progressing toward goals - comment    Frequency  Min 3X/week    PT Plan Current plan remains appropriate    Co-evaluation PT/OT/SLP Co-Evaluation/Treatment: Yes Reason for Co-Treatment: Complexity of the patient's impairments (multi-system involvement);For patient/therapist safety;Necessary to address cognition/behavior during functional activity PT goals addressed during session: Mobility/safety with mobility;Balance       End of Session Equipment Utilized During Treatment: Gait belt Activity Tolerance: Patient tolerated treatment well Patient left: with call bell/phone within reach;with family/visitor present;in chair;with chair alarm set     Time: [id number] PT Time Calculation (min) (ACUTE ONLY): 25 min  Charges:  $Gait Training: 8-22 mins                    G Codes:      Salina April, PTA Pager: [phone]   01/26/2016, 1:21 PM

## 2016-01-26 NOTE — Progress Notes (Signed)
There really is no change in Ms. Alesi condition. We still do not have any additional results back from her labs.  It's takes full support to get her out of bed. I don't think she can do any physical therapy.  There is some back discomfort. It just might be the bed itself.  She has some labs done yesterday. They looked okay. The autoimmune test that she had done all came back I think pretty much negative.  There is no fever. She's had no bleeding. She's had no nausea or vomiting., Sugar, she really is eating.  On her physical exam, her vital signs all look stable. Her blood pressures 105/55. Temperature 98.4. Her hent exam shows no thrush. There is no adenopathy in the neck. Lungs are clear. Cardiac exam regular rate and rhythm with no murmurs. Abdomen is soft. There is no fluid wave. There is no palpable liver or spleen tip. Extremities shows some muscle latching upper lower extremities. She does move all extremities spontaneously. Neurological exam shows the chronic neurological deficits. This appears to be unchanged. From my point of view, I'm not sure what else we can do for her. If she does have this PML, this is very little if any treatment that offers a decent response. I think be hard to prove that she has PML less the JC VIRUS comes back positive. Ultimately, you just wonder if a brain biopsy is going to be indicated. This would be incredibly invasive.  As always, I think the care that she is getting is fantastic. Everybody up on [facility] is doing a great job with her.  Lattie Haw, MD  Psalm 118:14

## 2016-01-26 NOTE — Progress Notes (Addendum)
Patient ID: [name], female   DOB: [birth year], 45 y.o.   MRN: [id number]                                                                PROGRESS NOTE                                                                                                                                                                                                             Patient Demographics:    [name], is a 45 y.o. female, DOB - 06/09/1970, [id number]  Admit date - 01/12/2016   Admitting Physician Theodis Blaze, MD  Outpatient Primary MD for the patient is Tammi Sou, MD  LOS - 13  Outpatient Specialists: Ulyses Jarred (neurologist)  Chief Complaint  Patient presents with  . Weakness       Brief Narrative  45 y.o. female, w difficulty with walking as well as generalized weakness x 4 weeks pt has been having increase in falls. MRI brain 8/18 => Interval progression of white matter signal abnormality, greatest in the pons, compared to the study 1 month earlier. 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    Subjective:    [name]   more somnolent and sleepy today, sitter and family friend is   by the bedside   Assessment  & Plan :    Principal Problem:   Weakness Active Problems:   Ataxia   Gait abnormality   Fever   Fall  Ataxia/Gait problems with subacute progressive cerebral dysfunction - MRI brain from 01/14/16 shows abnormal T2 signal in the pons . 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 Paraneoplastic panel still pending, send out to [facility] 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 ANCA panel pending SSA, SSB pending ANA pending Disposition- as per neurology, no additional testing or treatment is planned. Unfortunately patient does not need any referral to a tertiary care center as per neurologist Dr. Shon Hale, for any further diagnostic testing. Therefore patient will be discharged either to SNF vs  home with hospice . Requested patient's mother to come in tomorrow for a face-to-face meeting. Patient has been changed to a DO NOT RESUSCITATE    H/o recurrent follicular continuous lymphoma (non-Hodgkin's) Patient was treated with Gazyva/bendamustine. She had 4 cycles of chemotherapy. Her chemotherapy finished back in October 2016. She got 2 additional cycles of Gazyva. This was last given back in January - Neurology input greatly appreciated, CT scans with no evidence of lymphoma recurrence, PET scan 2 months with no evidence of recurrence, LP with no evidence of malignant cells , bone marrow biopsy 8/22 per Dr. Marin Olp, no evidence of malignancy  Hypokalemia - Repleted 8/26, recheck this AM pending.  Fever No fever, no leukocytosis, chest x-ray with no evidence of infiltrate, urinalysis unremarkable as well  Elevated AST/ALT Repeat LFTs unchanged  Code Status : Full  Family communication: D/W family friend ,by the bedside   Disposition Plan : Likely will need SNF placement, vs home with hospice   Consults  :  Oncology/neurology  DVT Prophylaxis  :  Heparin   Lab Results  Component Value Date   PLT 220 01/22/2016     Antibiotics  :   Anti-infectives    None        Objective:   Vitals:   01/25/16 2145 01/26/16 0045 01/26/16 0614 01/26/16 0830  BP: (!) 117/53 (!) 126/43 (!) 105/55 (!) 96/44  Pulse: 62 97 81 72  Resp: '16 16 16   '$ Temp: 98.1 F (36.7 C) 97.7 F (36.5 C) 98.4 F (36.9 C) 98.7 F (37.1 C)  TempSrc: Oral Axillary Axillary Axillary  SpO2: 99% 100% 100% 99%  Weight:      Height:        Wt Readings from Last 3 Encounters:  01/22/16 48.4 kg (106 lb 12.8 oz)  12/30/15 48.9 kg (107 lb 12.8 oz)  12/27/15 48.8 kg (107 lb 8 oz)     Intake/Output Summary (Last 24 hours) at 01/26/16 0948 Last data filed at 01/26/16 2376  Gross per 24 hour  Intake          2328.75 ml  Output                0 ml  Net          2328.75 ml     Physical Exam  Awake, but not oriented, more calm and conversant today. Supple Neck,No JV Symmetrical Chest wall movement, Good air movement bilaterally, CTAB RRR,No Gallops,Rubs or new Murmurs, No Parasternal Heave +ve B.Sounds, Abd Soft, No tenderness, No rebound - guarding or rigidity. No Cyanosis, Clubbing or edema, No new Rash or bruise  , poor coordination, dysmetria.    Data Review:    CBC  Recent Labs Lab 01/21/16 0907 01/22/16 0515  WBC 6.3 6.1  HGB 11.0* 11.6*  HCT 32.8* 35.4*  PLT 214 220  MCV 88.9 89.6  MCH 29.8 29.4  MCHC 33.5 32.8  RDW 13.2 12.9  LYMPHSABS 0.7  --   MONOABS 0.7  --  EOSABS 0.0  --   BASOSABS 0.0  --     Chemistries   Recent Labs Lab 01/20/16 0545 01/22/16 0515 01/22/16 0909 01/23/16 1146 01/24/16 0949 01/25/16 0524  NA 132* 135 134* 135  --  133*  K 3.6 2.8* 2.9* 3.4*  --  3.7  CL 100* 100* 103 105  --  100*  CO2 '25 27 25 24  '$ --  26  GLUCOSE 127* 116* 144* 108*  --  117*  BUN 12 <5* <5* 6  --  <5*  CREATININE 0.54 0.55 0.61 0.54  --  0.51  CALCIUM 9.2 9.1 9.0 8.7*  --  9.3  MG  --   --  2.0  --   --   --   AST  --   --  67*  --  82* 84*  ALT  --   --  110*  --  117* 130*  ALKPHOS   --   --  63  --  58 67  BILITOT  --   --  0.7  --  0.4 0.6   ------------------------------------------------------------------------------------------------------------------ No results for input(s): CHOL, HDL, LDLCALC, TRIG, CHOLHDL, LDLDIRECT in the last 72 hours.  No results found for: HGBA1C ------------------------------------------------------------------------------------------------------------------  Recent Labs  01/23/16 1146  TSH 1.174   ------------------------------------------------------------------------------------------------------------------ No results for input(s): VITAMINB12, FOLATE, FERRITIN, TIBC, IRON, RETICCTPCT in the last 72 hours.  Coagulation profile No results for input(s): INR, PROTIME in the last 168 hours.  No results for input(s): DDIMER in the last 72 hours.  Cardiac Enzymes No results for input(s): CKMB, TROPONINI, MYOGLOBIN in the last 168 hours.  Invalid input(s): CK ------------------------------------------------------------------------------------------------------------------ No results found for: BNP  Inpatient Medications  Scheduled Meds: . heparin subcutaneous  5,000 Units Subcutaneous Q8H  . ibuprofen  600 mg Oral TID  . nicotine  14 mg Transdermal Daily  . pantoprazole  40 mg Oral Daily  . polyethylene glycol  17 g Oral Daily  . potassium chloride SA  20 mEq Oral Daily  . sodium chloride flush  3 mL Intravenous Q12H  . thiamine  100 mg Oral Daily  . Vitamin D (Ergocalciferol)  50,000 Units Oral Weekly   Continuous Infusions: . dextrose 5 % and 0.9 % NaCl with KCl 40 mEq/L 75 mL/hr at 01/25/16 2010  . lactated ringers Stopped (01/24/16 1800)   PRN Meds:.acetaminophen **OR** acetaminophen, bisacodyl, LORazepam, mineral oil, traMADol  Micro Results No results found for this or any previous visit (from the past 240 hour(s)).  Radiology Reports Ct Head Wo Contrast  Result Date: 01/13/2016 CLINICAL DATA:  46 y/o F;  increased weakness and difficulty with speech mobility over the last 3 weeks. History of non-Hodgkin's lymphoma. EXAM: CT HEAD WITHOUT CONTRAST TECHNIQUE: Contiguous axial images were obtained from the base of the skull through the vertex without intravenous contrast. COMPARISON:  MRI of the brain dated 12/20/2015. FINDINGS: Brain: No evidence of acute infarction, hemorrhage, hydrocephalus, extra-axial collection or mass lesion/mass effect. Moderate generalized atrophy for patient's age. Few lucencies in white matter and within the pons corresponding FLAIR signal abnormality on prior MRI and are nonspecific. Vascular: No hyperdense vessel or unexpected calcification. Skull: No fracture is identified.  No suspicious lesion. Sinuses/Orbits: No acute finding. Other: No unexpected finding. IMPRESSION: No acute intracranial abnormality is identified. Stable generalized volume loss and mild nonspecific white matter lucencies possibly representing microvascular disease or posttreatment changes. Electronically Signed   By: Kristine Garbe M.D.   On: 01/13/2016 00:46   Ct  Soft Tissue Neck W Contrast  Result Date: 01/15/2016 CLINICAL DATA:  45 year old female with recurrent cutaneous non-Hodgkin lymphoma. Staging. Subsequent encounter. EXAM: CT NECK WITH CONTRAST TECHNIQUE: Multidetector CT imaging of the neck was performed using the standard protocol following the bolus administration of intravenous contrast. CONTRAST:  121m ISOVUE-300 IOPAMIDOL (ISOVUE-300) INJECTION 61% in conjunction with contrast enhanced imaging of the chest, abdomen, and pelvis reported separately. COMPARISON:  Brain MRI 01/14/2016. Head CT without contrast 01/13/2016. PET-CT 11/03/2015. FINDINGS: Pharynx and larynx: Larynx is normal in best seen on series 6, image 70. Pharyngeal soft tissue contours are within normal limits. Negative parapharyngeal and retropharyngeal spaces. Salivary glands: Negative sublingual space, sublingual glands,  submandibular glands, and parotid glands. Thyroid: Negative. Lymph nodes: No cervical lymphadenopathy. Only diminutive (4 mm short axis or smaller) bilateral lymph nodes are identified. Vascular: Major vascular structures in the neck and at skullbase are patent and appear normal. Limited intracranial: Stable from yesterday brain MRI. Visualized orbits: Negative. Mastoids and visualized paranasal sinuses: Visualized paranasal sinuses and mastoids are stable and well pneumatized. Skeleton: No osseous abnormality identified. Upper chest: Chest CT is reported separately today. IMPRESSION: 1. Negative neck CT.  No evidence of lymphoma in the neck. 2. CT chest abdomen and pelvis today reported separately. Electronically Signed   By: HGenevie AnnM.D.   On: 01/15/2016 18:23   Ct Chest W Contrast  Result Date: 01/15/2016 CLINICAL DATA:  Recurrent lymphoma. EXAM: CT CHEST, ABDOMEN, AND PELVIS WITH CONTRAST TECHNIQUE: Multidetector CT imaging of the chest, abdomen and pelvis was performed following the standard protocol during bolus administration of intravenous contrast. CONTRAST:  1030mISOVUE-300 IOPAMIDOL (ISOVUE-300) INJECTION 61% COMPARISON:  None. FINDINGS: CT CHEST FINDINGS Cardiovascular: The heart and great vessels are normal. Mediastinum/Nodes: Normal.  No adenopathy. Lungs/Pleura: Minimal linear opacity anteriorly in the right lung on series 3, image 61 is stable and of doubtful significance. This may represent a tiny region of scar. The lungs are otherwise normal. Musculoskeletal: Normal. CT ABDOMEN PELVIS FINDINGS Hepatobiliary: Normal. Pancreas: Normal. Spleen: Normal. Adrenals/Urinary Tract: Normal. Stomach/Bowel: The stomach and small bowel are normal. Moderate fecal loading is seen throughout the colon. The colon is otherwise normal. The cecum is seen in the right side of the pelvis. Visualized portions of the appendix are normal. Vascular/Lymphatic: No aneurysm or dissection.  No adenopathy. Reproductive:  The patient is status post tubal ligation. The uterus and adnexae are normal. Other: No other abnormalities. Musculoskeletal: No acute bony abnormalities. IMPRESSION: 1. No evidence of lymphoma recurrence in the chest, abdomen, or pelvis. 2. Moderate fecal loading throughout the colon. Electronically Signed   By: DaDorise BullionII M.D   On: 01/15/2016 18:30   Mr Brain W Wo Contrast  Result Date: 01/14/2016 CLINICAL DATA:  Recurrent follicular cutaneous lymphoma treated with chemotherapy. Recent PET scan was negative for disease. Recent onset of difficulty walking and speech issues. 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 EXAM: MRI HEAD WITHOUT AND WITH CONTRAST TECHNIQUE: Multiplanar, multiecho pulse sequences of the brain and surrounding structures were obtained without and with intravenous contrast. CONTRAST:  1099mULTIHANCE GADOBENATE DIMEGLUMINE 529 MG/ML IV SOLN COMPARISON:  12/20/2015. FINDINGS: No acute stroke or hemorrhage.  No mass lesion or extra-axial fluid. Global atrophy, hydrocephalus ex vacuo. Significant premature brain substance loss affecting the cerebral hemispheres and cerebellum. Premature for age subcortical greater than periventricular white matter signal abnormality, also involving the pons. No definite involvement of the cerebellar white matter. There is moderate interval progression of the T2 and  FLAIR hyperintensity in the pons, and to a lesser degree, progression in the subcortical white matter, compared with the study 1 month earlier. These show no restriction, susceptibility, or postcontrast enhancement. Partial empty sella. Flow voids are maintained. Extracranial soft tissues are unremarkable. Post infusion imaging through the entire head demonstrates no abnormal enhancement of brain or meninges. IMPRESSION: Interval progression of white matter signal abnormality, greatest in the pons, compared to the study 1 month earlier. These white matter  lesions show no restriction, susceptibility, or postcontrast enhancement. 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 Global atrophy affecting the cerebral hemispheres, brainstem, and cerebellum, stable from priors. No areas of restricted diffusion or postcontrast enhancement. Electronically Signed   By: Elsie Stain M.D.   On: 01/14/2016 18:37   Ct Abdomen Pelvis W Contrast  Result Date: 01/15/2016 CLINICAL DATA:  Recurrent lymphoma. EXAM: CT CHEST, ABDOMEN, AND PELVIS WITH CONTRAST TECHNIQUE: Multidetector CT imaging of the chest, abdomen and pelvis was performed following the standard protocol during bolus administration of intravenous contrast. CONTRAST:  ISOVUE-300 IOPAMIDOL (ISOVUE-300) INJECTION 61% COMPARISON:  None. FINDINGS: CT CHEST FINDINGS Cardiovascular: The heart and great vessels are normal. Mediastinum/Nodes: Normal.  No adenopathy. Lungs/Pleura: Minimal linear opacity anteriorly in the right lung on series 3, image 61 is stable and of doubtful significance. This may represent a tiny region of scar. The lungs are otherwise normal. Musculoskeletal: Normal. CT ABDOMEN PELVIS FINDINGS Hepatobiliary: Normal. Pancreas: Normal. Spleen: Normal. Adrenals/Urinary Tract: Normal. Stomach/Bowel: The stomach and small bowel are normal. Moderate fecal loading is seen throughout the colon. The colon is otherwise normal. The cecum is seen in the right side of the pelvis. Visualized portions of the appendix are normal. Vascular/Lymphatic: No aneurysm or dissection.  No adenopathy. Reproductive: The patient is status post tubal ligation. The uterus and adnexae are normal. Other: No other abnormalities. Musculoskeletal: No acute bony abnormalities. IMPRESSION: 1. No evidence of lymphoma recurrence in the chest, abdomen, or pelvis. 2. Moderate fecal loading throughout the colon. Electronically Signed   By: Gerome Sam III M.D   On: 01/15/2016 18:30   Ct  Biopsy  Result Date: 01/18/2016 CLINICAL DATA:  History of cutaneous lymphoma. Bone marrow biopsy requested to assess for any evidence of lymphoma recurrence. EXAM: CT GUIDED BONE MARROW ASPIRATION AND CORE BIOPSY ANESTHESIA/SEDATION: Versed 2.0 mg IV, Fentanyl 100 mcg IV Total Moderate Sedation Time 20 minutes. The patient's level of consciousness and physiologic status were continuously monitored during the procedure by Radiology nursing. PROCEDURE: The procedure risks, benefits, and alternatives were explained to the patient's mother. Questions regarding the procedure were encouraged and answered. The patient's mother understands and consents to the procedure. A time-out was performed prior to the procedure. The right gluteal region was prepped with Betadine. Sterile gown and sterile gloves were used for the procedure. Local anesthesia was provided with 1% Lidocaine. Under CT guidance, an 11 gauge OnControl bone cutting needle was advanced from a posterior approach into the right iliac bone. Needle positioning was confirmed with CT. Initial non heparinized and heparinized aspirate samples were obtained of bone marrow. Core biopsy was performed with the 11 gauge needle. COMPLICATIONS: None FINDINGS: Inspection of initial aspirate did reveal visible particles. Intact core biopsy sample was obtained. IMPRESSION: CT guided bone marrow biopsy of right posterior iliac bone with both aspirate and core samples obtained. Electronically Signed   By: Irish Lack M.D.   On: 01/18/2016 11:07   Dg Chest Port 1 View  Result Date: 01/21/2016  CLINICAL DATA:  Fever, confusion, history of cutaneous non-Hodgkin's lymphoma EXAM: PORTABLE CHEST 1 VIEW COMPARISON:  CT scan of the chest of January 15, 2016 FINDINGS: The lungs are adequately inflated. There is no focal infiltrate. There is no pleural effusion. The heart and pulmonary vascularity are normal. The mediastinum is normal in width. The bony thorax is unremarkable.  IMPRESSION: There is no active cardiopulmonary disease. Electronically Signed   By: David  Martinique M.D.   On: 01/21/2016 08:48   Dg Hip Unilat With Pelvis 2-3 Views Left  Result Date: 01/13/2016 CLINICAL DATA:  45 y/o  F; status post fall with anterior hip pain. EXAM: DG HIP (WITH OR WITHOUT PELVIS) 2-3V LEFT COMPARISON:  None. FINDINGS: There is no evidence of hip fracture or dislocation. There is no evidence of arthropathy or other focal bone abnormality. Clips project over the pelvis. IMPRESSION: No acute fracture or dislocation is identified. Electronically Signed   By: Kristine Garbe M.D.   On: 01/13/2016 02:07      Meira Wahba M.D on 01/26/2016 at 9:48 AM  Between 7am to 7pm - Pager - [phone]  After 7pm go to [email or web address] - password [facility]  [facility] -  Office  [phone]

## 2016-01-26 NOTE — Progress Notes (Signed)
Occupational Therapy Treatment Patient Details Name: XOCHITL KOPKO MRN: [id number] DOB: 30-Mar-1971 Today's Date: 01/26/2016    History of present illness Janna D Goodstein is a 45 yo female who was admitted with generalized weakness, difficulty walking, and thoracic pain.  PMH includes non-hodgkin's lymphoma, HSV-2, cerebellar ataxia, anxiety and depression.   OT comments  Pt with improvement in following commands this session, although PTA states pt did not follpw as well as last PT session. Pt ambulated to bathroom with +2 assist hand held for toileting and stood at sink for hygiene. OT to continue to follow acutely  Follow Up Recommendations  SNF;Supervision/Assistance - 24 hour    Equipment Recommendations  Other (comment) (TBD at next venue of care)    Recommendations for Other Services      Precautions / Restrictions Precautions Precautions: Fall Restrictions Weight Bearing Restrictions: No       Mobility Bed Mobility Overal bed mobility: Independent Bed Mobility: Supine to Sit     Supine to sit: Max assist;+2 for physical assistance;HOB elevated     General bed mobility comments: max multimodal cues for hand placement and sequencing; hand over hand assist for pt to use bilat UE for sitting balance once EOB; assist to bring bilat LE to EOB, elevate trunk into sitting, and scoot hips to EOB  Transfers Overall transfer level: Needs assistance Equipment used: 2 person hand held assist Transfers: Sit to/from Stand Sit to Stand: Max assist;+2 physical assistance Stand pivot transfers: +2 physical assistance;Min assist       General transfer comment: from EOB and BSC; multimodal cues for hand placment and initiation of transfer; assist to power up into standing and maintain balance upon stand; when pt comes into stand she takes a step forward each time     Balance     Sitting balance-Leahy Scale: Poor Sitting balance - Comments: mod/max A to maintain sitting  balance     Standing balance-Leahy Scale: Zero                     ADL Overall ADL's : Needs assistance/impaired     Grooming: Wash/dry hands;Wash/dry face;Moderate assistance Grooming Details (indicate cue type and reason): multimodal cues and hand over hand guiding required                 Toilet Transfer: +2 for physical assistance;Ambulation;Maximal assistance;Regular Toilet;Grab bars   Toileting- Clothing Manipulation and Hygiene: Sit to/from stand;Total assistance;Maximal assistance Toileting - Clothing Manipulation Details (indicate cue type and reason): multimodal cues and hand over hand guiding required for hygiene              Vision  no change                               Cognition   Behavior During Therapy: WFL for tasks assessed/performed Overall Cognitive Status: Impaired/Different from baseline Area of Impairment: Attention;Following commands;Safety/judgement;Awareness;Problem solving   Current Attention Level: Focused    Following Commands: Follows one step commands with increased time;Follows one step commands inconsistently Safety/Judgement: Decreased awareness of safety;Decreased awareness of deficits Awareness: Intellectual Problem Solving: Slow processing;Decreased initiation;Requires verbal cues;Requires tactile cues General Comments: pt presented cognitively much like session from last Friday and was less responsive verbally and followed commands inconsistently which is different from her responsiveness 2 days ago; pt initiated tasks with increased time and with inconsistently however had difficulty attending to tasks  General Comments  pt pleasant and cooperative    Pertinent Vitals/ Pain       Pain Assessment: No/denies pain Faces Pain Scale: No hurt Pain Intervention(s): Monitored during session                                                           Frequency Min 2X/week     Progress Toward Goals  OT Goals(current goals can now be found in the care plan section)  Progress towards OT goals: Progressing toward goals  Acute Rehab OT Goals Patient Stated Goal: none stated   Plan Discharge plan remains appropriate    Co-evaluation    PT/OT/SLP Co-Evaluation/Treatment: Yes Reason for Co-Treatment: Complexity of the patient's impairments (multi-system involvement);For patient/therapist safety;Necessary to address cognition/behavior during functional activity PT goals addressed during session: Mobility/safety with mobility;Balance OT goals addressed during session: ADL's and self-care;Proper use of Adaptive equipment and DME      End of Session Equipment Utilized During Treatment: Gait belt   Activity Tolerance Patient tolerated treatment well   Patient Left with call bell/phone within reach;with family/visitor present;with nursing/sitter in room;in chair;with chair alarm set             Time: [id number] OT Time Calculation (min): 16 min  Charges: OT General Charges $OT Visit: 1 Procedure OT Treatments $Self Care/Home Management : 8-22 mins  Britt Bottom 01/26/2016, 2:29 PM

## 2016-01-27 LAB — GLUCOSE, CAPILLARY
GLUCOSE-CAPILLARY: 122 mg/dL — AB (ref 65–99)
GLUCOSE-CAPILLARY: 139 mg/dL — AB (ref 65–99)
GLUCOSE-CAPILLARY: 127 mg/dL — AB (ref 65–99)
Glucose-Capillary: 125 mg/dL — ABNORMAL HIGH (ref 65–99)

## 2016-01-27 MED ORDER — ALBUTEROL SULFATE (2.5 MG/3ML) 0.083% IN NEBU: 2.5000 mg | INHALATION_SOLUTION | RESPIRATORY_TRACT | Status: DC | PRN

## 2016-01-27 MED ORDER — ACETAMINOPHEN 325 MG PO TABS: 650.0000 mg | ORAL_TABLET | Freq: Four times a day (QID) | ORAL | Status: DC | PRN

## 2016-01-27 MED ORDER — HALOPERIDOL LACTATE 2 MG/ML PO CONC
0.5000 mg | ORAL | Status: DC | PRN
  Filled 2016-01-27: qty 0.3

## 2016-01-27 MED ORDER — ONDANSETRON HCL 4 MG/2ML IJ SOLN: 4.0000 mg | Freq: Four times a day (QID) | INTRAMUSCULAR | Status: DC | PRN

## 2016-01-27 MED ORDER — HALOPERIDOL LACTATE 5 MG/ML IJ SOLN: 0.5000 mg | INTRAMUSCULAR | Status: DC | PRN

## 2016-01-27 MED ORDER — MORPHINE SULFATE 25 MG/ML IV SOLN
1.0000 mg/h | INTRAVENOUS | Status: DC
  Administered 2016-01-27: 1 mg/h via INTRAVENOUS
  Filled 2016-01-27: qty 10

## 2016-01-27 MED ORDER — LORAZEPAM 2 MG/ML PO CONC: 1.0000 mg | ORAL | Status: DC | PRN

## 2016-01-27 MED ORDER — LORAZEPAM 1 MG PO TABS: 1.0000 mg | ORAL_TABLET | ORAL | Status: DC | PRN

## 2016-01-27 MED ORDER — HALOPERIDOL 1 MG PO TABS: 0.5000 mg | ORAL_TABLET | ORAL | Status: DC | PRN

## 2016-01-27 MED ORDER — LORAZEPAM 2 MG/ML IJ SOLN
1.0000 mg | INTRAMUSCULAR | Status: DC | PRN
  Administered 2016-01-27 – 2016-01-28 (×2): 1 mg via INTRAVENOUS
  Filled 2016-01-27 (×2): qty 1

## 2016-01-27 MED ORDER — LORAZEPAM 1 MG PO TABS: 2.0000 mg | ORAL_TABLET | Freq: Three times a day (TID) | ORAL | 0 refills | Status: AC | PRN

## 2016-01-27 MED ORDER — POLYVINYL ALCOHOL 1.4 % OP SOLN
1.0000 [drp] | Freq: Four times a day (QID) | OPHTHALMIC | Status: DC | PRN
  Filled 2016-01-27: qty 15

## 2016-01-27 MED ORDER — MORPHINE SULFATE (CONCENTRATE) 10 MG /0.5 ML PO SOLN: 5.0000 mg | ORAL | 0 refills | Status: AC | PRN

## 2016-01-27 MED ORDER — MORPHINE SULFATE (CONCENTRATE) 10 MG/0.5ML PO SOLN
5.0000 mg | ORAL | Status: DC | PRN
  Administered 2016-01-27 – 2016-01-28 (×5): 5 mg via ORAL
  Filled 2016-01-27 (×5): qty 0.5

## 2016-01-27 MED ORDER — ACETAMINOPHEN 650 MG RE SUPP: 650.0000 mg | Freq: Four times a day (QID) | RECTAL | Status: DC | PRN

## 2016-01-27 MED ORDER — BIOTENE DRY MOUTH MT LIQD: 15.0000 mL | OROMUCOSAL | Status: DC | PRN

## 2016-01-27 MED ORDER — ONDANSETRON 4 MG PO TBDP: 4.0000 mg | ORAL_TABLET | Freq: Four times a day (QID) | ORAL | Status: DC | PRN

## 2016-01-27 MED ORDER — GLYCOPYRROLATE 0.2 MG/ML IJ SOLN: 0.2000 mg | INTRAMUSCULAR | Status: DC | PRN

## 2016-01-27 MED ORDER — TRAMADOL HCL 50 MG PO TABS: 50.0000 mg | ORAL_TABLET | Freq: Three times a day (TID) | ORAL | 0 refills | Status: AC | PRN

## 2016-01-27 MED ORDER — GLYCOPYRROLATE 1 MG PO TABS: 1.0000 mg | ORAL_TABLET | ORAL | Status: DC | PRN

## 2016-01-27 NOTE — Clinical Social Work Note (Signed)
CSW met with pt's mother this morning to address dispostion, along with RNCM and Palliative Care. Pt is residential hospice appropriate. Pt's mother chose [facility]. CSW made referral and [facility] made a bed offer. CSW updated MD. Pt will now have a SLP eval. Pt will discharge in the morning to residential hospice. CSW will continue to follow.   Darden Dates, MSW, LCSW  Clinical Social Worker  [phone]

## 2016-01-27 NOTE — Discharge Summary (Addendum)
Physician Discharge Summary  [name] MRN: [id number] DOB/AGE: 43-May-1972 43 y.o.  PCP: Jeoffrey Massed, MD   Admit date: 01/12/2016 Discharge date: 01/27/2016  Discharge Diagnoses:    Principal Problem:   Weakness Active Problems:   Ataxia   Gait abnormality   Fever   Fall   Cerebellar dysmetria   Dysarthria    Follow-up recommendations Patient being discharged to SNF with palliative care services VS hospice facility       Current Discharge Medication List    START taking these medications   Details  bisacodyl (DULCOLAX) 10 MG suppository Place 1 suppository (10 mg total) rectally daily as needed for moderate constipation or severe constipation. Qty: 12 suppository, Refills: 0    Morphine Sulfate (MORPHINE CONCENTRATE) 10 mg / 0.5 ml concentrated solution Place 0.25 mLs (5 mg total) under the tongue every 4 (four) hours as needed for moderate pain or severe pain. Qty: 30 mL, Refills: 0    nicotine (NICODERM CQ - DOSED IN MG/24 HOURS) 14 mg/24hr patch Place 1 patch (14 mg total) onto the skin daily. Qty: 28 patch, Refills: 0    pantoprazole (PROTONIX) 40 MG tablet Take 1 tablet (40 mg total) by mouth daily. Qty: 30 tablet, Refills: 0    polyethylene glycol (MIRALAX / GLYCOLAX) packet Take 17 g by mouth daily. Qty: 14 each, Refills: 0    thiamine 100 MG tablet Take 1 tablet (100 mg total) by mouth daily. Qty: 30 tablet, Refills: 1    traMADol (ULTRAM) 50 MG tablet Take 1 tablet (50 mg total) by mouth every 8 (eight) hours as needed for severe pain. Qty: 30 tablet, Refills: 0      CONTINUE these medications which have CHANGED   Details  LORazepam (ATIVAN) 1 MG tablet Take 2 tablets (2 mg total) by mouth every 8 (eight) hours as needed for anxiety. Qty: 30 tablet, Refills: 0      CONTINUE these medications which have NOT CHANGED   Details  ergocalciferol (VITAMIN D2) 50000 UNITS capsule Take 1 capsule (50,000 Units total) by mouth once a week. Qty:  12 capsule, Refills: 3   Associated Diagnoses: Vitamin D deficiency    potassium chloride SA (K-DUR,KLOR-CON) 20 MEQ tablet One tablet once every three days. Qty: 10 tablet, Refills: 2   Associated Diagnoses: Cutaneous B-cell lymphoma (HCC)         Discharge Condition: Overall prognosis poor Discharge Instructions Get Medicines reviewed and adjusted: Please take all your medications with you for your next visit with your Primary MD  Please request your Primary MD to go over all hospital tests and procedure/radiological results at the follow up, please ask your Primary MD to get all Hospital records sent to his/her office.  If you experience worsening of your admission symptoms, develop shortness of breath, life threatening emergency, suicidal or homicidal thoughts you must seek medical attention immediately by calling 911 or calling your MD immediately if symptoms less severe.  You must read complete instructions/literature along with all the possible adverse reactions/side effects for all the Medicines you take and that have been prescribed to you. Take any new Medicines after you have completely understood and accpet all the possible adverse reactions/side effects.   Do not drive when taking Pain medications.   Do not take more than prescribed Pain, Sleep and Anxiety Medications  Special Instructions: If you have smoked or chewed Tobacco in the last 2 yrs please stop smoking, stop any regular Alcohol and or any Recreational drug  use.  Wear Seat belts while driving.  Please note  You were cared for by a hospitalist during your hospital stay. Once you are discharged, your primary care physician will handle any further medical issues. Please note that NO REFILLS for any discharge medications will be authorized once you are discharged, as it is imperative that you return to your primary care physician (or establish a relationship with a primary care physician if you do not have one)  for your aftercare needs so that they can reassess your need for medications and monitor your lab values.     No Known Allergies    Disposition: SNF   Consults: Oncology Neurology     Significant Diagnostic Studies:  Ct Head Wo Contrast  Result Date: 01/13/2016 CLINICAL DATA:  45 y/o F; increased weakness and difficulty with speech mobility over the last 3 weeks. History of non-Hodgkin's lymphoma. EXAM: CT HEAD WITHOUT CONTRAST TECHNIQUE: Contiguous axial images were obtained from the base of the skull through the vertex without intravenous contrast. COMPARISON:  MRI of the brain dated 12/20/2015. FINDINGS: Brain: No evidence of acute infarction, hemorrhage, hydrocephalus, extra-axial collection or mass lesion/mass effect. Moderate generalized atrophy for patient's age. Few lucencies in white matter and within the pons corresponding FLAIR signal abnormality on prior MRI and are nonspecific. Vascular: No hyperdense vessel or unexpected calcification. Skull: No fracture is identified.  No suspicious lesion. Sinuses/Orbits: No acute finding. Other: No unexpected finding. IMPRESSION: No acute intracranial abnormality is identified. Stable generalized volume loss and mild nonspecific white matter lucencies possibly representing microvascular disease or posttreatment changes. Electronically Signed   By: Kristine Garbe M.D.   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 Staging. Subsequent encounter. EXAM: CT NECK WITH CONTRAST TECHNIQUE: Multidetector CT imaging of the neck was performed using the standard protocol following the bolus administration of intravenous contrast. CONTRAST:  123m ISOVUE-300 IOPAMIDOL (ISOVUE-300) INJECTION 61% in conjunction with contrast enhanced imaging of the chest, abdomen, and pelvis reported separately. COMPARISON:  Brain MRI 01/14/2016.  Head CT without contrast 01/13/2016. PET-CT 11/03/2015. FINDINGS: Pharynx and larynx: Larynx is normal in best seen on series 6, image 70. Pharyngeal soft tissue contours are within normal limits. Negative parapharyngeal and retropharyngeal spaces. Salivary glands: Negative sublingual space, sublingual glands, submandibular glands, and parotid glands. Thyroid: Negative. Lymph nodes: No cervical lymphadenopathy. Only diminutive (4 mm short axis or smaller) bilateral lymph nodes are identified. Vascular: Major vascular structures in the neck and at skullbase are patent and appear normal. Limited intracranial: Stable from yesterday brain MRI. Visualized orbits: Negative. Mastoids and visualized paranasal sinuses: Visualized paranasal sinuses and mastoids are stable and well pneumatized. Skeleton: No osseous abnormality identified. Upper chest: Chest CT is reported separately today. IMPRESSION: 1. Negative neck CT.  No evidence of lymphoma in the neck. 2. CT chest abdomen and pelvis today reported separately. Electronically Signed   By: HGenevie AnnM.D.   On: 01/15/2016 18:23   Ct Chest W Contrast  Result Date: 01/15/2016 CLINICAL DATA:  Recurrent lymphoma. EXAM: CT CHEST, ABDOMEN, AND PELVIS WITH CONTRAST TECHNIQUE: Multidetector CT imaging of the chest, abdomen and pelvis was performed following the standard protocol during bolus administration of intravenous contrast. CONTRAST:  1036mISOVUE-300 IOPAMIDOL (ISOVUE-300) INJECTION 61% COMPARISON:  None. FINDINGS: CT CHEST FINDINGS Cardiovascular: The heart and great vessels are normal. Mediastinum/Nodes: Normal.  No adenopathy. Lungs/Pleura: Minimal linear opacity anteriorly in the  right lung on series 3, image 61 is stable and of doubtful significance. This may represent a tiny region of scar. The lungs are otherwise normal. Musculoskeletal: Normal. CT ABDOMEN PELVIS FINDINGS Hepatobiliary: Normal. Pancreas: Normal. Spleen: Normal. Adrenals/Urinary Tract: Normal.  Stomach/Bowel: The stomach and small bowel are normal. Moderate fecal loading is seen throughout the colon. The colon is otherwise normal. The cecum is seen in the right side of the pelvis. Visualized portions of the appendix are normal. Vascular/Lymphatic: No aneurysm or dissection.  No adenopathy. Reproductive: The patient is status post tubal ligation. The uterus and adnexae are normal. Other: No other abnormalities. Musculoskeletal: No acute bony abnormalities. IMPRESSION: 1. No evidence of lymphoma recurrence in the chest, abdomen, or pelvis. 2. Moderate fecal loading throughout the colon. 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 Recent PET scan was negative for disease. Recent onset of difficulty walking and speech issues. 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 EXAM: MRI HEAD WITHOUT AND WITH CONTRAST TECHNIQUE: Multiplanar, multiecho pulse sequences of the brain and surrounding structures were obtained without and with intravenous contrast. CONTRAST:  81m MULTIHANCE GADOBENATE DIMEGLUMINE 529 MG/ML IV SOLN COMPARISON:  12/20/2015. FINDINGS: No acute stroke or hemorrhage.  No mass lesion or extra-axial fluid. Global atrophy, hydrocephalus ex vacuo. Significant premature brain substance loss affecting the cerebral hemispheres and cerebellum. Premature for age subcortical greater than periventricular white matter signal abnormality, also involving the pons. No definite involvement of the cerebellar white matter. There is moderate interval progression of the T2 and FLAIR hyperintensity in the pons, and to a lesser degree, progression in the subcortical white matter, compared with the study 1 month earlier. These show no restriction, susceptibility, or postcontrast enhancement. Partial empty sella. Flow voids are  maintained. Extracranial soft tissues are unremarkable. Post infusion imaging through the entire head demonstrates no abnormal enhancement of brain or meninges. IMPRESSION: Interval progression of white matter signal abnormality, greatest in the pons, compared to the study 1 month earlier. These white matter lesions show no restriction, susceptibility, or postcontrast enhancement. 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 Global atrophy affecting the cerebral hemispheres, brainstem, and cerebellum, stable from priors. No areas of restricted diffusion or postcontrast enhancement. Electronically Signed   By: JStaci RighterM.D.   On: 01/14/2016 18:37   Ct Abdomen Pelvis W Contrast  Result Date: 01/15/2016 CLINICAL DATA:  Recurrent lymphoma. EXAM: CT CHEST, ABDOMEN, AND PELVIS WITH CONTRAST TECHNIQUE: Multidetector CT imaging of the chest, abdomen and pelvis was performed following the standard protocol during bolus administration of intravenous contrast. CONTRAST:  1055mISOVUE-300 IOPAMIDOL (ISOVUE-300) INJECTION 61% COMPARISON:  None. FINDINGS: CT CHEST FINDINGS Cardiovascular: The heart and great vessels are normal. Mediastinum/Nodes: Normal.  No adenopathy. Lungs/Pleura: Minimal linear opacity anteriorly in the right lung on series 3, image 61 is stable and of doubtful significance. This may represent a tiny region of scar. The lungs are otherwise normal. Musculoskeletal: Normal. CT ABDOMEN PELVIS FINDINGS Hepatobiliary: Normal. Pancreas: Normal. Spleen: Normal. Adrenals/Urinary Tract: Normal. Stomach/Bowel: The stomach and small bowel are normal. Moderate fecal loading is seen throughout the colon. The colon is otherwise normal. The cecum is seen in the right side of the pelvis. Visualized portions of the appendix are normal. Vascular/Lymphatic: No aneurysm or dissection.  No adenopathy. Reproductive: The patient is status post tubal ligation. The  uterus and  adnexae are normal. Other: No other abnormalities. Musculoskeletal: No acute bony abnormalities. IMPRESSION: 1. No evidence of lymphoma recurrence in the chest, abdomen, or pelvis. 2. Moderate fecal loading throughout the colon. Electronically Signed   By: Dorise Bullion III M.D   On: 01/15/2016 18:30   Ct Biopsy  Result Date: 01/18/2016 CLINICAL DATA:  History of cutaneous lymphoma. Bone marrow biopsy requested to assess for any evidence of lymphoma recurrence. EXAM: CT GUIDED BONE MARROW ASPIRATION AND CORE BIOPSY ANESTHESIA/SEDATION: Versed 2.0 mg IV, Fentanyl 100 mcg IV Total Moderate Sedation Time 20 minutes. The patient's level of consciousness and physiologic status were continuously monitored during the procedure by Radiology nursing. PROCEDURE: The procedure risks, benefits, and alternatives were explained to the patient's mother. Questions regarding the procedure were encouraged and answered. The patient's mother understands and consents to the procedure. A time-out was performed prior to the procedure. The right gluteal region was prepped with Betadine. Sterile gown and sterile gloves were used for the procedure. Local anesthesia was provided with 1% Lidocaine. Under CT guidance, an 11 gauge OnControl bone cutting needle was advanced from a posterior approach into the right iliac bone. Needle positioning was confirmed with CT. Initial non heparinized and heparinized aspirate samples were obtained of bone marrow. Core biopsy was performed with the 11 gauge needle. COMPLICATIONS: None FINDINGS: Inspection of initial aspirate did reveal visible particles. Intact core biopsy sample was obtained. IMPRESSION: CT guided bone marrow biopsy of right posterior iliac bone with both aspirate and core samples obtained. Electronically Signed   By: Aletta Edouard M.D.   On: 01/18/2016 11:07   Dg Chest Port 1 View  Result Date: 01/21/2016 CLINICAL DATA:  Fever, confusion, history of cutaneous non-Hodgkin's  lymphoma EXAM: PORTABLE CHEST 1 VIEW COMPARISON:  CT scan of the chest of January 15, 2016 FINDINGS: The lungs are adequately inflated. There is no focal infiltrate. There is no pleural effusion. The heart and pulmonary vascularity are normal. The mediastinum is normal in width. The bony thorax is unremarkable. IMPRESSION: There is no active cardiopulmonary disease. Electronically Signed   By: David  Martinique M.D.   On: 01/21/2016 08:48   Dg Hip Unilat With Pelvis 2-3 Views Left  Result Date: 01/13/2016 CLINICAL DATA:  45 y/o  F; status post fall with anterior hip pain. EXAM: DG HIP (WITH OR WITHOUT PELVIS) 2-3V LEFT COMPARISON:  None. FINDINGS: There is no evidence of hip fracture or dislocation. There is no evidence of arthropathy or other focal bone abnormality. Clips project over the pelvis. IMPRESSION: No acute fracture or dislocation is identified. Electronically Signed   By: Kristine Garbe M.D.   On: 01/13/2016 02:07       Filed Weights   01/17/16 1500 01/18/16 0538 01/22/16 0500  Weight: 51.1 kg (112 lb 9 oz) 51.2 kg (112 lb 12.8 oz) 48.4 kg (106 lb 12.8 oz)     Microbiology: No results found for this or any previous visit (from the past 240 hour(s)).     Blood Culture No results found for: SDES, Wilder, CULT, REPTSTATUS    Labs: Results for orders placed or performed during the hospital encounter of 01/12/16 (from the past 48 hour(s))  Glucose, capillary     Status: Abnormal   Collection Time: 01/25/16 11:38 AM  Result Value Ref Range   Glucose-Capillary 105 (H) 65 - 99 mg/dL   Comment 1 Notify RN    Comment 2 Document in Chart   Glucose, capillary  Status: Abnormal   Collection Time: 01/25/16  4:42 PM  Result Value Ref Range   Glucose-Capillary 110 (H) 65 - 99 mg/dL   Comment 1 Notify RN    Comment 2 Document in Chart   Glucose, capillary     Status: Abnormal   Collection Time: 01/25/16  9:44 PM  Result Value Ref Range   Glucose-Capillary 121 (H) 65  - 99 mg/dL   Comment 1 Notify RN    Comment 2 Document in Chart   Glucose, capillary     Status: Abnormal   Collection Time: 01/26/16  6:02 AM  Result Value Ref Range   Glucose-Capillary 103 (H) 65 - 99 mg/dL   Comment 1 Notify RN    Comment 2 Document in Chart   Glucose, capillary     Status: Abnormal   Collection Time: 01/26/16 11:52 AM  Result Value Ref Range   Glucose-Capillary 137 (H) 65 - 99 mg/dL   Comment 1 Notify RN    Comment 2 Document in Chart   Glucose, capillary     Status: Abnormal   Collection Time: 01/26/16  4:37 PM  Result Value Ref Range   Glucose-Capillary 124 (H) 65 - 99 mg/dL   Comment 1 Notify RN    Comment 2 Document in Chart   Glucose, capillary     Status: Abnormal   Collection Time: 01/26/16  9:44 PM  Result Value Ref Range   Glucose-Capillary 112 (H) 65 - 99 mg/dL   Comment 1 Notify RN    Comment 2 Document in Chart   Glucose, capillary     Status: Abnormal   Collection Time: 01/27/16  6:25 AM  Result Value Ref Range   Glucose-Capillary 122 (H) 65 - 99 mg/dL   Comment 1 Notify RN    Comment 2 Document in Chart      Lipid Panel  No results found for: CHOL, TRIG, HDL, CHOLHDL, VLDL, LDLCALC, LDLDIRECT   No results found for: HGBA1C   Lab Results  Component Value Date   CREATININE 0.51 01/25/2016     HPI :  [name] is a 45 y.o. 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 Patient followed by  Christin Bach, MD .She had a history of recurrent follicular cutaneous lymphoma. We treated her with Gazyva/bendamustine. She had 4 cycles of chemotherapy. Her chemotherapy finished back in October 2016. She got 2 additional cycles of Gazyva. This was last given back in January.PET scan done in June which did not show any evidence of lymphoma.MRI brain 8/18 => Interval progression of white matter signal abnormality, greatest in  the pons, compared to the study 1 month earlier. 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  HOSPITAL COURSE:    Ataxia/Gait problems with subacute progressive cerebral dysfunction - MRI brain from 01/14/16 shows abnormal T2 signal in the pons . 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 Paraneoplastic panel still pending, send out to [facility] , expectorated and on 9/8. 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 ANCA panel pending SSA weakly positive and SSB negative. ANA negative ANA  negative Disposition- as per neurology, no additional testing or treatment is planned. Unfortunately patient does not need any referral to a tertiary care center as per neurologist Dr. Shon Hale, for any further diagnostic testing. Therefore patient will be discharged either to SNF vs   hospice facility.  Patient has been changed to a DO NOT RESUSCITATE. At this time her prognosis is less than 4 weeks   H/o recurrent follicular continuous lymphoma (non-Hodgkin's) Patient was treated with Gazyva/bendamustine. She had 4 cycles of chemotherapy. Her chemotherapy finished back in October 2016. She got 2 additional  cycles of Gazyva. This was last given back in January - Neurology input greatly appreciated, CT scans with no evidence of lymphoma recurrence, PET scan 2 months with no evidence of recurrence, LP with no evidence of malignant cells , bone marrow biopsy 8/22 per Dr. Marin Olp, no evidence of malignancy  Hypokalemia Repleted  Fever No fever, no leukocytosis, chest x-ray with no evidence of infiltrate, urinalysis unremarkable as well  Elevated AST/ALT Repeat LFTs unchanged   Discharge Exam:   Blood pressure 110/69, pulse 84, temperature 99 F (37.2 C), temperature source Axillary, resp. rate 18, height '5\' 2"'$  (1.575 m), weight 48.4 kg (106 lb 12.8 oz), SpO2 99 %.  Awake, but not oriented, more calm and conversant today. Supple Neck,No JV Symmetrical Chest wall movement, Good air movement bilaterally, CTAB RRR,No Gallops,Rubs or new Murmurs, No Parasternal Heave +ve B.Sounds, Abd Soft, No tenderness, No rebound - guarding or rigidity. No Cyanosis, Clubbing or edema, No new Rash or bruise  , poor coordination, dysmetria.     Contact information for follow-up providers    MCGOWEN,PHILIP H, MD. Schedule an appointment as soon as possible for a visit in 3 day(s).   Specialty:  Family Medicine Why:  Hospital follow-up Contact information: [address] [place] [address] [phone]        [facility]. Schedule an appointment as soon as possible for a visit in 2 day(s).   Contact information:   Address: [address] [place], [place] [address]    Phone: [phone]           Contact information for after-discharge care    Destination    [facility] .   Specialty:  [place] information: [address] [phone]                  Signed: Reyne Dumas 01/27/2016, 8:49 AM        Time spent >45 mins

## 2016-01-27 NOTE — Progress Notes (Signed)
md made aware that pt not swallowing enough to take medications.

## 2016-01-27 NOTE — Evaluation (Signed)
Clinical/Bedside Swallow Evaluation Patient Details  Name: [name] MRN: [id number] Date of Birth: 05-Oct-1970  Today's Date: 01/27/2016 Time: SLP Start Time (ACUTE ONLY): 24 SLP Stop Time (ACUTE ONLY): 1628 SLP Time Calculation (min) (ACUTE ONLY): 12 min  Past Medical History:  Past Medical History:  Diagnosis Date  . Adult ADHD   . Anxiety and depression   . 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 Leonie Man.  Paraneoplastic lab panel pending.  Marland Kitchen HSV-2 (herpes simplex virus 2) infection   . Menorrhagia    Regular: 7 days of bleeding (2 heavy and 5 light).  . 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  Initial dx age 74--treated with chemo.  Recurrence x 2 after the birth of each of her children--different chemo regimen used.  Recurrence again spring 2016-in the mid/upper back, and as of 10/2015 PET scan showed no sign of dz.  Dr. Marin Olp wants her on maintenance Gavyza to prevent recurrence but as of 12/27/15 pt has not chosen to do this yet.  . Tobacco dependence in remission    Past Surgical History:  Past Surgical History:  Procedure Laterality Date  . BASAL CELL CARCINOMA EXCISION    . COLONOSCOPY  appox age [age] yrs   Normal  . ENDOMETRIAL ABLATION  06/2008  . HYSTEROSCOPY  08/2007   polyp  . RADIOLOGY WITH ANESTHESIA N/A 01/14/2016   Procedure: RADIOLOGY WITH ANESTHESIA;  Surgeon: Medication Radiologist, MD;  Location: [facility];  Service: Radiology;  Laterality: N/A;  . TUBAL LIGATION     HPI:  [name] is a 45 y.o. 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 Patient followed by  Lattie Haw, MD .She had a history of recurrent follicular cutaneous lymphoma. We treated her with Gazyva/bendamustine. She had 4 cycles of chemotherapy. Her chemotherapy finished back in October 2016. She got 2 additional cycles of  Gazyva. This was last given back in January.PET scan done in June which did not show any evidence of lymphoma.MRI brain 8/18 => Interval progression of white matter signal abnormality, greatest in the pons, compared to the study 1 month earlier. 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 Pt discharging to residential hospice and SLP consulted to determine if any PO appropriate at this time.    Assessment / Plan / Recommendation Clinical Impression  Pt referred for clinical swallow assessment given concern for aspiration after family provided pt with water. Pt is transferring to residential hospice tomorrow so comfort is of primary concern. Pt was positioned upright, oral care completed. Pt trialed with small ice chip. Despite multiple attempts to swallow, pt was unable to initiate/complete pharyngeal response. Pt then appeared to have difficulty clearing her secretions and required base of tongue suctioning to relieve respiratory status. Recommend: NPO for pt comfort as she is no longer able to swallow, but continues to have sensation for airway compromise. Recommend careful oral care with suction for pt comfort. No further services indicated.     Aspiration Risk  Severe aspiration risk    Diet Recommendation NPO   Medication Administration: Via alternative means    Other  Recommendations Oral Care Recommendations: Oral care BID  Follow up Recommendations  24 hour supervision/assistance (inpt hospice)    Frequency and Duration            Prognosis Prognosis for Safe Diet Advancement: Guarded      Swallow Study   General Date of Onset: 01/24/16 HPI: [name] is a 45 y.o. 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 Patient followed by  Lattie Haw, MD .She had a history of recurrent follicular cutaneous lymphoma. We treated her with Gazyva/bendamustine. She had 4 cycles of chemotherapy. Her chemotherapy finished back in October 2016. She got 2 additional cycles of Gazyva. This was last given back in January.PET scan done in June which did not show any evidence of lymphoma.MRI brain 8/18 => Interval progression of white matter signal abnormality, greatest in the pons, compared to the study 1 month earlier. 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 Pt discharging to residential hospice and SLP consulted to determine if any PO appropriate at this time.  Type of Study: Bedside Swallow Evaluation Previous Swallow Assessment: none Diet Prior to this Study: NPO Temperature Spikes Noted: No Respiratory Status: Room air History of Recent Intubation: No Behavior/Cognition: Alert Oral Cavity Assessment: Within Functional Limits Oral Care Completed by SLP: Yes Oral Cavity - Dentition: Adequate natural dentition Self-Feeding Abilities: Total assist Patient Positioning: Upright in bed Baseline Vocal Quality: Not observed Volitional Cough: Cognitively unable to elicit Volitional Swallow: Unable to elicit    Oral/Motor/Sensory Function Overall Oral Motor/Sensory Function: Severe impairment   Ice Chips Ice chips: Impaired Presentation: Spoon Oral Phase Impairments: Reduced lingual movement/coordination Pharyngeal Phase Impairments: Suspected delayed Swallow;Unable to trigger swallow;Cough - Delayed   Thin Liquid Thin Liquid: Not tested    Nectar Thick Nectar Thick Liquid: Not tested   Honey  Thick Honey Thick Liquid: Not tested   Puree Puree: Not tested   Solid   GO   Solid: Not tested        Vinetta Bergamo MA, [place] Pager [phone] 01/27/2016,4:38 PM

## 2016-01-27 NOTE — Progress Notes (Signed)
Mother attempted to give pt drink of water. Mother called out, when staff entered room pt coughing and making choking noises. Pt sat upright, rn asked if mother had given pt food or drink. Mother had given drink. rn instructed mother  Not to give any more fluids by mouth.   After a few minutes pt breathing calmed down. Pt still sitting upright at this time.

## 2016-01-28 LAB — GLUCOSE, CAPILLARY: Glucose-Capillary: 123 mg/dL — ABNORMAL HIGH (ref 65–99)

## 2016-01-28 MED ORDER — POLYVINYL ALCOHOL 1.4 % OP SOLN: 1.0000 [drp] | Freq: Four times a day (QID) | OPHTHALMIC | 0 refills | Status: AC | PRN

## 2016-01-28 MED ORDER — GLYCOPYRROLATE 1 MG PO TABS: 1.0000 mg | ORAL_TABLET | ORAL | 1 refills | Status: AC | PRN

## 2016-01-28 NOTE — Discharge Summary (Signed)
Physician Discharge Summary  [name] MRN: [id number] DOB/AGE: 06/28/1970 45 y.o.  PCP: Erin Sou, MD   Admit date: 01/12/2016 Discharge date: 01/28/2016  Discharge Diagnoses:    Principal Problem:   Weakness Active Problems:   Ataxia   Gait abnormality   Fever   Fall   Cerebellar dysmetria   Dysarthria    Follow-up recommendations Patient being discharged to  [facility]        Current Discharge Medication List    START taking these medications   Details  bisacodyl (DULCOLAX) 10 MG suppository Place 1 suppository (10 mg total) rectally daily as needed for moderate constipation or severe constipation. Qty: 12 suppository, Refills: 0    glycopyrrolate (ROBINUL) 1 MG tablet Take 1 tablet (1 mg total) by mouth every 4 (four) hours as needed (excessive secretions). Qty: 30 tablet, Refills: 1    Morphine Sulfate (MORPHINE CONCENTRATE) 10 mg / 0.5 ml concentrated solution Place 0.25 mLs (5 mg total) under the tongue every 4 (four) hours as needed for moderate pain or severe pain. Qty: 30 mL, Refills: 0    pantoprazole (PROTONIX) 40 MG tablet Take 1 tablet (40 mg total) by mouth daily. Qty: 30 tablet, Refills: 0    polyethylene glycol (MIRALAX / GLYCOLAX) packet Take 17 g by mouth daily. Qty: 14 each, Refills: 0    polyvinyl alcohol (LIQUIFILM TEARS) 1.4 % ophthalmic solution Place 1 drop into both eyes 4 (four) times daily as needed for dry eyes. Qty: 15 mL, Refills: 0    traMADol (ULTRAM) 50 MG tablet Take 1 tablet (50 mg total) by mouth every 8 (eight) hours as needed for severe pain. Qty: 30 tablet, Refills: 0      CONTINUE these medications which have CHANGED   Details  LORazepam (ATIVAN) 1 MG tablet Take 2 tablets (2 mg total) by mouth every 8 (eight) hours as needed for anxiety. Qty: 30 tablet, Refills: 0      CONTINUE these medications which have NOT CHANGED   Details  potassium chloride SA (K-DUR,KLOR-CON) 20 MEQ tablet One tablet  once every three days. 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  If you experience worsening of your admission symptoms, develop shortness of breath, life threatening emergency, suicidal or homicidal thoughts you must seek medical attention immediately by calling 911 or calling your MD immediately if symptoms less severe.  You must read complete instructions/literature along with all the possible adverse reactions/side effects for all the Medicines you take and that have been prescribed to you. Take any new Medicines after you have completely understood and accpet all the possible adverse reactions/side effects.   Do not drive when taking Pain medications.   Do not take more than prescribed Pain, Sleep and Anxiety Medications  Special Instructions: If you have smoked or chewed Tobacco in the last 2 yrs please stop smoking, stop any regular Alcohol and or any Recreational drug use.  Wear Seat  belts while driving.  Please note  You were cared for by a hospitalist during your hospital stay. Once you are discharged, your primary care physician will handle any further medical issues. Please note that NO REFILLS for any discharge medications will be authorized once you are discharged, as it is imperative that you return to your primary care physician (or establish a relationship with a primary care physician if you do not have one) for your aftercare needs so that they can  reassess your need for medications and monitor your lab values.  Discharge Instructions    Diet - low sodium heart healthy    Complete by:  As directed   Increase activity slowly    Complete by:  As directed       No Known Allergies    Disposition:  [facility]    Consults: Oncology Neurology     Significant Diagnostic Studies:  Ct Head Wo Contrast  Result Date: 01/13/2016 CLINICAL DATA:  45 y/o F; increased weakness and difficulty with speech mobility over the last 3 weeks. History of non-Hodgkin's lymphoma. EXAM: CT HEAD WITHOUT CONTRAST TECHNIQUE: Contiguous axial images were obtained from the base of the skull through the vertex without intravenous contrast. COMPARISON:  MRI of the brain dated 12/20/2015. FINDINGS: Brain: No evidence of acute infarction, hemorrhage, hydrocephalus, extra-axial collection or mass lesion/mass effect. Moderate generalized atrophy for patient's age. Few lucencies in white matter and within the pons corresponding FLAIR signal abnormality on prior MRI and are nonspecific. Vascular: No hyperdense vessel or unexpected calcification. Skull: No fracture is identified.  No suspicious lesion. Sinuses/Orbits: No acute finding. Other: No unexpected finding. IMPRESSION: No acute intracranial abnormality is identified. Stable generalized volume loss and mild nonspecific white matter lucencies possibly representing microvascular disease or posttreatment changes. Electronically Signed   By: Kristine Garbe M.D.   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 Staging. Subsequent encounter. EXAM: CT NECK WITH CONTRAST TECHNIQUE: Multidetector CT imaging of the neck was performed using the standard protocol following the bolus administration of intravenous contrast. CONTRAST:  165m ISOVUE-300 IOPAMIDOL (ISOVUE-300) INJECTION 61% in  conjunction with contrast enhanced imaging of the chest, abdomen, and pelvis reported separately. COMPARISON:  Brain MRI 01/14/2016. Head CT without contrast 01/13/2016. PET-CT 11/03/2015. FINDINGS: Pharynx and larynx: Larynx is normal in best seen on series 6, image 70. Pharyngeal soft tissue contours are within normal limits. Negative parapharyngeal and retropharyngeal spaces. Salivary glands: Negative sublingual space, sublingual glands, submandibular glands, and parotid glands. Thyroid: Negative. Lymph nodes: No cervical lymphadenopathy. Only diminutive (4 mm short axis or smaller) bilateral lymph nodes are identified. Vascular: Major vascular structures in the neck and at skullbase are patent and appear normal. Limited intracranial: Stable from yesterday brain MRI. Visualized orbits: Negative. Mastoids and visualized paranasal sinuses: Visualized paranasal sinuses and mastoids are stable and well pneumatized. Skeleton: No osseous abnormality identified. Upper chest: Chest CT is reported separately today. IMPRESSION: 1. Negative neck CT.  No evidence of lymphoma in the neck. 2. CT chest abdomen and pelvis today reported separately. Electronically Signed   By: HGenevie AnnM.D.   On: 01/15/2016 18:23   Ct Chest W Contrast  Result Date: 01/15/2016 CLINICAL DATA:  Recurrent lymphoma. EXAM: CT CHEST, ABDOMEN, AND PELVIS WITH CONTRAST TECHNIQUE: Multidetector CT imaging of the chest, abdomen and pelvis was performed following the standard protocol during bolus administration of intravenous contrast.  CONTRAST:  172m ISOVUE-300 IOPAMIDOL (ISOVUE-300) INJECTION 61% COMPARISON:  None. FINDINGS: CT CHEST FINDINGS Cardiovascular: The heart and great vessels are normal. Mediastinum/Nodes: Normal.  No adenopathy. Lungs/Pleura: Minimal linear opacity anteriorly in the right lung on series 3, image 61 is stable and of doubtful significance. This may represent a tiny region of scar. The lungs are otherwise normal.  Musculoskeletal: Normal. CT ABDOMEN PELVIS FINDINGS Hepatobiliary: Normal. Pancreas: Normal. Spleen: Normal. Adrenals/Urinary Tract: Normal. Stomach/Bowel: The stomach and small bowel are normal. Moderate fecal loading is seen throughout the colon. The colon is otherwise normal. The cecum is seen in the right side of the pelvis. Visualized portions of the appendix are normal. Vascular/Lymphatic: No aneurysm or dissection.  No adenopathy. Reproductive: The patient is status post tubal ligation. The uterus and adnexae are normal. Other: No other abnormalities. Musculoskeletal: No acute bony abnormalities. IMPRESSION: 1. No evidence of lymphoma recurrence in the chest, abdomen, or pelvis. 2. Moderate fecal loading throughout the colon. 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 Recent PET scan was negative for disease. Recent onset of difficulty walking and speech issues. 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 EXAM: MRI HEAD WITHOUT AND WITH CONTRAST TECHNIQUE: Multiplanar, multiecho pulse sequences of the brain and surrounding structures were obtained without and with intravenous contrast. CONTRAST:  160mMULTIHANCE GADOBENATE DIMEGLUMINE 529 MG/ML IV SOLN COMPARISON:  12/20/2015. FINDINGS: No acute stroke or hemorrhage.  No mass lesion or extra-axial fluid. Global atrophy, hydrocephalus ex vacuo. Significant premature brain substance loss affecting the cerebral hemispheres and cerebellum. Premature for age subcortical greater than periventricular white matter signal abnormality, also involving the pons. No definite involvement of the cerebellar white matter. There is moderate interval progression of the T2 and FLAIR hyperintensity in the pons, and to a lesser degree, progression in the subcortical white matter, compared  with the study 1 month earlier. These show no restriction, susceptibility, or postcontrast enhancement. Partial empty sella. Flow voids are maintained. Extracranial soft tissues are unremarkable. Post infusion imaging through the entire head demonstrates no abnormal enhancement of brain or meninges. IMPRESSION: Interval progression of white matter signal abnormality, greatest in the pons, compared to the study 1 month earlier. These white matter lesions show no restriction, susceptibility, or postcontrast enhancement. 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 Global atrophy affecting the cerebral hemispheres, brainstem, and cerebellum, stable from priors. No areas of restricted diffusion or postcontrast enhancement. Electronically Signed   By: JoStaci Righter.D.   On: 01/14/2016 18:37   Ct Abdomen Pelvis W Contrast  Result Date: 01/15/2016 CLINICAL DATA:  Recurrent lymphoma. EXAM: CT CHEST, ABDOMEN, AND PELVIS WITH CONTRAST TECHNIQUE: Multidetector CT imaging of the chest, abdomen and pelvis was performed following the standard protocol during bolus administration of intravenous contrast. CONTRAST:  10031mSOVUE-300 IOPAMIDOL (ISOVUE-300) INJECTION 61% COMPARISON:  None. FINDINGS: CT CHEST FINDINGS Cardiovascular: The heart and great vessels are normal. Mediastinum/Nodes: Normal.  No adenopathy. Lungs/Pleura: Minimal linear opacity anteriorly in the right lung on series 3, image 61 is stable and of doubtful significance. This may represent a tiny region of scar. The lungs are otherwise normal. Musculoskeletal: Normal. CT ABDOMEN PELVIS FINDINGS Hepatobiliary: Normal. Pancreas: Normal. Spleen: Normal. Adrenals/Urinary Tract: Normal. Stomach/Bowel: The stomach and small bowel are normal. Moderate fecal loading is seen throughout the colon. The colon is otherwise normal.  The cecum is seen in the right side of the pelvis. Visualized portions of the appendix are normal.  Vascular/Lymphatic: No aneurysm or dissection.  No adenopathy. Reproductive: The patient is status post tubal ligation. The uterus and adnexae are normal. Other: No other abnormalities. Musculoskeletal: No acute bony abnormalities. IMPRESSION: 1. No evidence of lymphoma recurrence in the chest, abdomen, or pelvis. 2. Moderate fecal loading throughout the colon. Electronically Signed   By: Dorise Bullion III M.D   On: 01/15/2016 18:30   Ct Biopsy  Result Date: 01/18/2016 CLINICAL DATA:  History of cutaneous lymphoma. Bone marrow biopsy requested to assess for any evidence of lymphoma recurrence. EXAM: CT GUIDED BONE MARROW ASPIRATION AND CORE BIOPSY ANESTHESIA/SEDATION: Versed 2.0 mg IV, Fentanyl 100 mcg IV Total Moderate Sedation Time 20 minutes. The patient's level of consciousness and physiologic status were continuously monitored during the procedure by Radiology nursing. PROCEDURE: The procedure risks, benefits, and alternatives were explained to the patient's mother. Questions regarding the procedure were encouraged and answered. The patient's mother understands and consents to the procedure. A time-out was performed prior to the procedure. The right gluteal region was prepped with Betadine. Sterile gown and sterile gloves were used for the procedure. Local anesthesia was provided with 1% Lidocaine. Under CT guidance, an 11 gauge OnControl bone cutting needle was advanced from a posterior approach into the right iliac bone. Needle positioning was confirmed with CT. Initial non heparinized and heparinized aspirate samples were obtained of bone marrow. Core biopsy was performed with the 11 gauge needle. COMPLICATIONS: None FINDINGS: Inspection of initial aspirate did reveal visible particles. Intact core biopsy sample was obtained. IMPRESSION: CT guided bone marrow biopsy of right posterior iliac bone with both aspirate and core samples obtained. Electronically Signed   By: Aletta Edouard M.D.   On:  01/18/2016 11:07   Dg Chest Port 1 View  Result Date: 01/21/2016 CLINICAL DATA:  Fever, confusion, history of cutaneous non-Hodgkin's lymphoma EXAM: PORTABLE CHEST 1 VIEW COMPARISON:  CT scan of the chest of January 15, 2016 FINDINGS: The lungs are adequately inflated. There is no focal infiltrate. There is no pleural effusion. The heart and pulmonary vascularity are normal. The mediastinum is normal in width. The bony thorax is unremarkable. IMPRESSION: There is no active cardiopulmonary disease. Electronically Signed   By: David  Martinique M.D.   On: 01/21/2016 08:48   Dg Hip Unilat With Pelvis 2-3 Views Left  Result Date: 01/13/2016 CLINICAL DATA:  45 y/o  F; status post fall with anterior hip pain. EXAM: DG HIP (WITH OR WITHOUT PELVIS) 2-3V LEFT COMPARISON:  None. FINDINGS: There is no evidence of hip fracture or dislocation. There is no evidence of arthropathy or other focal bone abnormality. Clips project over the pelvis. IMPRESSION: No acute fracture or dislocation is identified. Electronically Signed   By: Kristine Garbe M.D.   On: 01/13/2016 02:07       Filed Weights   01/18/16 0538 01/22/16 0500 01/28/16 0500  Weight: 51.2 kg (112 lb 12.8 oz) 48.4 kg (106 lb 12.8 oz) 47.9 kg (105 lb 9.6 oz)     Microbiology: No results found for this or any previous visit (from the past 240 hour(s)).     Blood Culture No results found for: SDES, [place], CULT, REPTSTATUS    Labs: Results for orders placed or performed during the hospital encounter of 01/12/16 (from the past 48 hour(s))  Glucose, capillary     Status: Abnormal   Collection Time: 01/26/16 11:52 AM  Result Value Ref Range   Glucose-Capillary 137 (H) 65 - 99 mg/dL   Comment 1 Notify RN    Comment 2 Document in Chart   Glucose, capillary     Status: Abnormal   Collection Time: 01/26/16  4:37 PM  Result Value Ref Range   Glucose-Capillary 124 (H) 65 - 99 mg/dL   Comment 1 Notify RN    Comment 2 Document in  Chart   Glucose, capillary     Status: Abnormal   Collection Time: 01/26/16  9:44 PM  Result Value Ref Range   Glucose-Capillary 112 (H) 65 - 99 mg/dL   Comment 1 Notify RN    Comment 2 Document in Chart   Glucose, capillary     Status: Abnormal   Collection Time: 01/27/16  6:25 AM  Result Value Ref Range   Glucose-Capillary 122 (H) 65 - 99 mg/dL   Comment 1 Notify RN    Comment 2 Document in Chart   Glucose, capillary     Status: Abnormal   Collection Time: 01/27/16 11:39 AM  Result Value Ref Range   Glucose-Capillary 125 (H) 65 - 99 mg/dL   Comment 1 Notify RN    Comment 2 Document in Chart   Glucose, capillary     Status: Abnormal   Collection Time: 01/27/16  4:36 PM  Result Value Ref Range   Glucose-Capillary 139 (H) 65 - 99 mg/dL   Comment 1 Notify RN    Comment 2 Document in Chart   Glucose, capillary     Status: Abnormal   Collection Time: 01/27/16  9:44 PM  Result Value Ref Range   Glucose-Capillary 127 (H) 65 - 99 mg/dL   Comment 1 Notify RN    Comment 2 Document in Chart   Glucose, capillary     Status: Abnormal   Collection Time: 01/28/16  6:15 AM  Result Value Ref Range   Glucose-Capillary 123 (H) 65 - 99 mg/dL   Comment 1 Notify RN    Comment 2 Document in Chart      Lipid Panel  No results found for: CHOL, TRIG, HDL, CHOLHDL, VLDL, LDLCALC, LDLDIRECT   No results found for: HGBA1C   Lab Results  Component Value Date   CREATININE 0.51 01/25/2016     HPI :  [name] is a 45 y.o. 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 Patient followed by  Lattie Haw, MD .She had a history of recurrent follicular cutaneous lymphoma. We treated her with Gazyva/bendamustine. She had 4 cycles of chemotherapy. Her chemotherapy finished back in October 2016. She got 2 additional cycles of Gazyva. This was last given back in January.PET scan  done in June which did not show any evidence of lymphoma.MRI brain 8/18 => Interval progression of white matter signal abnormality, greatest in the pons, compared to the study 1 month earlier. 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  HOSPITAL COURSE:    Ataxia/Gait problems with subacute progressive cerebral dysfunction - MRI brain from 01/14/16 shows abnormal T2 signal in the pons . 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 Paraneoplastic panel still pending, send out to [facility] , expectorated and on  9/8. 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 ANCA panel pending SSA weakly positive and SSB negative. ANA negative ANA  negative Disposition- as per neurology, no additional testing or treatment is planned. Unfortunately patient does not need any referral to a tertiary care center as per neurologist Dr. Shon Hale, for any further diagnostic testing. Therefore patient will be discharged either to   hospice facility.  Patient has been changed to a DO NOT RESUSCITATE. At this time her prognosis is less than 4 weeks   H/o recurrent follicular continuous lymphoma (non-Hodgkin's) Patient was  treated with Gazyva/bendamustine. She had 4 cycles of chemotherapy. Her chemotherapy finished back in October 2016. She got 2 additional cycles of Gazyva. This was last given back in January - Neurology input greatly appreciated, CT scans with no evidence of lymphoma recurrence, PET scan 2 months with no evidence of recurrence, LP with no evidence of malignant cells , bone marrow biopsy 8/22 per Dr. Marin Olp, no evidence of malignancy  Hypokalemia Repleted  Fever No fever, no leukocytosis, chest x-ray with no evidence of infiltrate, urinalysis unremarkable as well  Elevated AST/ALT Repeat LFTs unchanged   Discharge Exam:   Blood pressure (!) 95/47, pulse 88, temperature 99.3 F (37.4 C), temperature source Oral, resp. rate 14, height '5\' 2"'$  (1.575 m), weight 47.9 kg (105 lb 9.6 oz), SpO2 98 %.  Awake, but not oriented, more calm and conversant today. Supple Neck,No JV Symmetrical Chest wall movement, Good air movement bilaterally, CTAB RRR,No Gallops,Rubs or new Murmurs, No Parasternal Heave +ve B.Sounds, Abd Soft, No tenderness, No rebound - guarding or rigidity. No Cyanosis, Clubbing or edema, No new Rash or bruise  , poor coordination, dysmetria.     Contact information for follow-up providers    MCGOWEN,PHILIP H, MD. Schedule an appointment as soon as possible for a visit in 3 day(s).   Specialty:  Family Medicine Why:  Hospital follow-up Contact information: [address] [place] [address] [phone]        [facility]. Schedule an appointment as soon as possible for a visit in 2 day(s).   Contact information:   Address: [address], [place], [place] [address]    Phone: [phone]           Contact information for after-discharge care    Destination    [facility] .   Specialty:  [place] information: [address] [place] [phone]                   Signed: Reyne Dumas 01/28/2016, 8:26 AM        Time spent >45 mins

## 2016-01-28 NOTE — Clinical Social Work Note (Signed)
Clinical Social Worker facilitated patient discharge including contacting patient family and facility to confirm patient discharge plans.  Clinical information faxed to facility and family agreeable with plan.  CSW arranged ambulance transport via [facility] to [facility].  RN to call report prior to discharge.  Clinical Social Worker will sign off for now as social work intervention is no longer needed. Please consult us again if new need arises.  Jesse Tavita Eastham, LCSW [phone] 

## 2016-01-28 NOTE — Progress Notes (Signed)
Pt discharged to facility. [facility] arrived. Discharge instructions and report given to nurse at facility, all questions answered. IV removed. Foley still in place. 2 printed prescriptions given to [facility]. Telemetry removed.  Time of discharge: [place], toy stuffed cat, and fan only belongings being sent with [facility]. All other belongings taken by pts mother.   Mother tearful upon departure. rn gave mother a hug.

## 2016-01-28 NOTE — Progress Notes (Signed)
Report given to Maudie Mercury, RN hospice nurse

## 2016-01-28 NOTE — Progress Notes (Signed)
Currently patient is resting comfortably. Patient has now been transferred to comfort care and is awaiting hospice placement. At this time no further recommendations per neurology. Neurology will sign off please call us with any questions.  Etta Quill PA-C [facility] [phone]  01/28/2016, 9:09 AM

## 2016-01-28 NOTE — Progress Notes (Signed)
I am personally devastated by the events that have happened with Mrs. Ricard Dillon. Despite everything that we have done, she still has declined neurologically. She is now not responding to voice or pain. She appears to be comfortable. She is on a morphine drip.  It is still unclear as to what has happened. The paraneoplastic panel is still pending. She does not have any obvious recurrent lymphoma.  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 Again this would be incredibly rare to have PML happen this far out from therapy. 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  I talked to her mom at length this morning. It sounds like Ms. Colquitt will be going to hospice home in [place]. I think this is incredibly reasonable. It will help out the family.  Her mom is really showing a lot of strength. I can understand where Ms. Devonshire gets her strength from as she was dealing with her husband's illness and his death from malignancy.  I'm just incredibly upset that she has declined like this. When I saw her 6-8 weeks ago, she looked quite good.  Her mother is incredibly appreciative of all the care that her daughter has been given.  I was incredibly humbled when her mother asked me to speak at her daughter's funeral whenever that may be. It will clearly be a true honor to be able to speak some words of encouragement and inspiration. I have always had the highest respect for Ms. Laneve as she was dealing with her own cancer and try to help her husband. She had always put her husband's health before her own.  I'm glad that she is on a morphine drip. I know this will give her that comfort that she needs.  Again this is just a very personal disappointment for me. I just never would have thought that we would be in this situation with Mrs. Ricard Dillon.  Hopefully she will be able to go to the hospice home today. I would think that her ultimate destination probably will be in a week or so at  most.  Lattie Haw, MD  2 Timothy 4:16-18

## 2016-01-28 NOTE — Progress Notes (Signed)
IV morphine drip wasted, [employer] witnessed.

## 2016-01-28 NOTE — Care Management Note (Signed)
Case Management Note  Patient Details  Name: [name] MRN: [id number] Date of Birth: 06/07/1970  Subjective/Objective:                    Action/Plan: Pt discharging to [facility]. No furhter needs per CM.  Expected Discharge Date:   (unknown)               Expected Discharge Plan:  [place]  In-House Referral:     Discharge planning Services     Post Acute Care Choice:    Choice offered to:     DME Arranged:    DME Agency:     HH Arranged:    [place] Agency:     Status of Service:  In process, will continue to follow  If discussed at Long Length of Stay Meetings, dates discussed:    Additional Comments:  Pollie Friar, RN 01/28/2016, 11:28 AM

## 2016-02-01 ENCOUNTER — Encounter (HOSPITAL_COMMUNITY): Payer: Self-pay

## 2016-02-02 ENCOUNTER — Telehealth: Payer: Self-pay | Admitting: *Deleted

## 2016-02-02 NOTE — Telephone Encounter (Signed)
Received notification from [facility] that patient has passed away on Feb 20, 2016 .  Dr. Niel Hummer notified.  Flowers sent

## 2016-02-27 DEATH — deceased

## 2016-03-13 ENCOUNTER — Telehealth: Payer: Self-pay | Admitting: Hematology & Oncology

## 2016-03-13 NOTE — Telephone Encounter (Signed)
I left a phone message for Ms. [place] regarding crystals test for JC virus. We sent off her spinal fluid to see if this was a positive as regards to a reason for her quick and rapid neurological decompensation. 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 This can be seen with immunotherapy with the Gazyva/Rituxan.  Again, it typically is seen when patients are on treatment not 8 months after they received her last treatment.  The test was negative. This is a fairly accurate test for the JC virus.  In my message I again offered my prayers and I told her again how much we enjoyed having Keshayla in her lives and how this world is now a better place because of Donnae.  I know where she is. She is with her husband, Sherren Mocha, who passed away in several months before she with metastatic esophageal cancer.  It is anything else that we can do to try to help out, we certainly will.  We all Ms. Jennet quite a bit. She was a "breath of fresh air" with her attitude, with her personality, and with her convictions.  I told her if there is anything that we can do to please let us know.  Lattie Haw, MD

## 2016-03-21 ENCOUNTER — Ambulatory Visit: Payer: BLUE CROSS/BLUE SHIELD | Admitting: Neurology

## 2016-03-23 ENCOUNTER — Encounter: Payer: Self-pay | Admitting: Neurology

## 2016-03-28 ENCOUNTER — Telehealth: Payer: Self-pay | Admitting: Neurology

## 2016-03-28 NOTE — Telephone Encounter (Signed)
thanks

## 2016-03-28 NOTE — Telephone Encounter (Signed)
Patient's mother called to advise that the patient passed away on 02-26-2016.

## 2016-09-08 ENCOUNTER — Other Ambulatory Visit: Payer: Self-pay | Admitting: Nurse Practitioner

## 2017-10-31 IMAGING — MR MR HEAD WO/W CM
10 of 12 series · 33 of 48 positions shown · IV contrast (Yes   MULTIHANCE)
Comparison: 12/20/2015.

CLINICAL DATA: Recurrent follicular cutaneous lymphoma treated with
chemotherapy. Recent PET scan was negative for disease. Recent onset
of difficulty walking and speech issues. Query paraneoplastic
syndrome with autoimmune cerebellar degeneration.

EXAM:
MRI HEAD WITHOUT AND WITH CONTRAST
TECHNIQUE: Multiplanar, multiecho pulse sequences of the brain and surrounding
structures were obtained without and with intravenous contrast.
CONTRAST:  10mL MULTIHANCE GADOBENATE DIMEGLUMINE 529 MG/ML IV SOLN

[Series 3: T1 · sagittal · 5.0mm · 0.47mm/px · 3 of 23 slices shown]
[im 1/23]
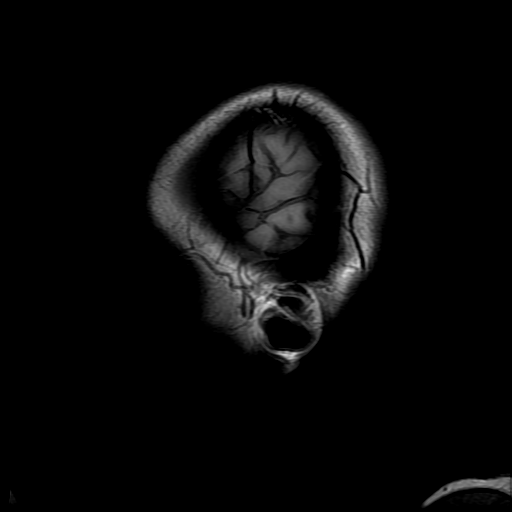
[im 12/23]
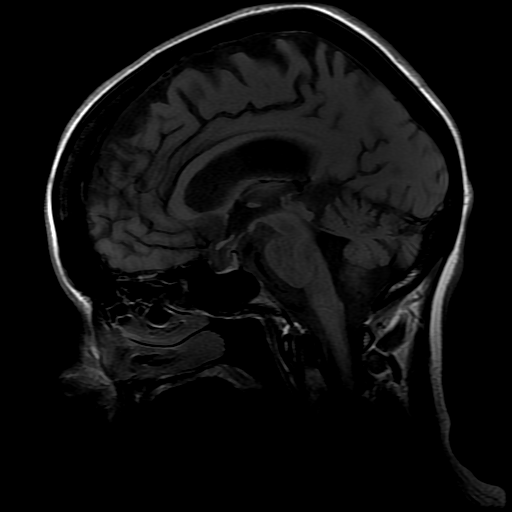
[im 23/23]
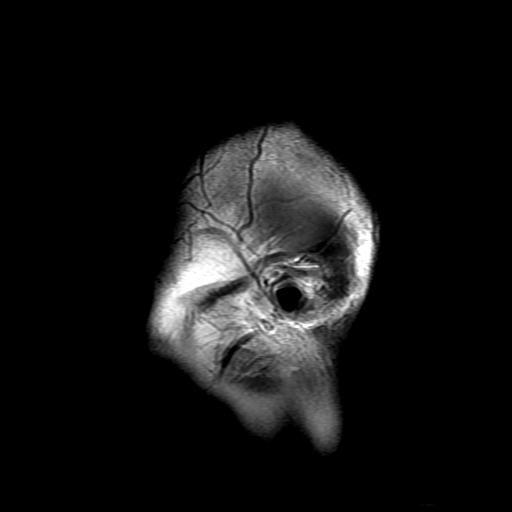

[Series 4: DWI · axial · 3.0mm · 1.09mm/px · z∈[-40,+94]mm · 8 of 94 slices shown (1 of 4)]
[im 1/94]
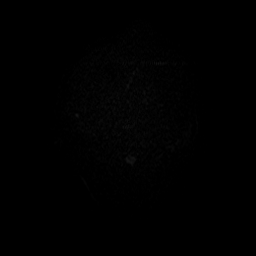
[im 14/94]
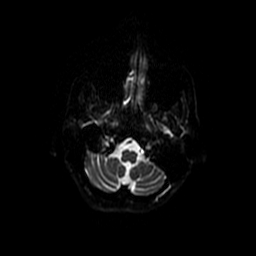
[im 27/94]
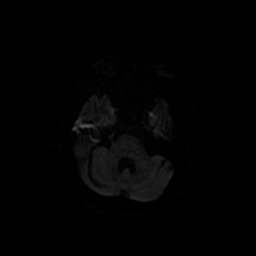
[im 40/94]
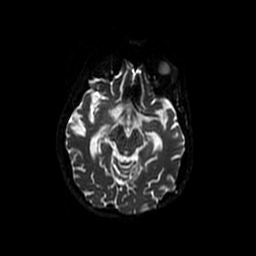
[im 54/94]
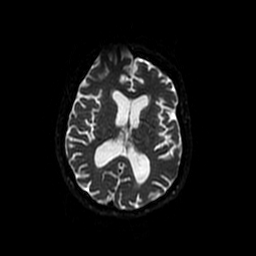
[im 67/94]
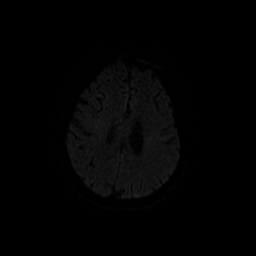
[im 80/94]
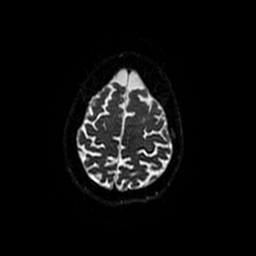
[im 94/94]
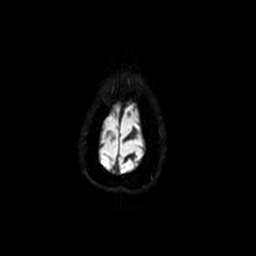

[Series 5: DWI · coronal · 5.0mm · 1.09mm/px · 6 of 66 slices shown (2 of 4)]
[im 1/66]
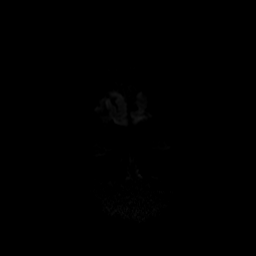
[im 14/66]
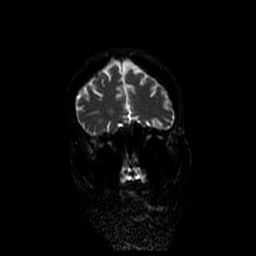
[im 27/66]
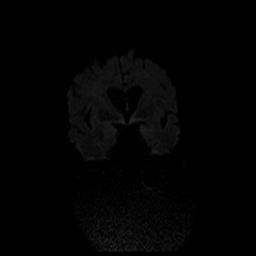
[im 40/66]
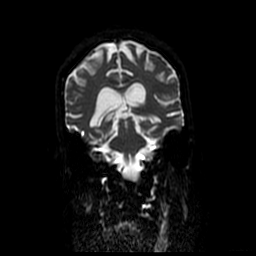
[im 53/66]
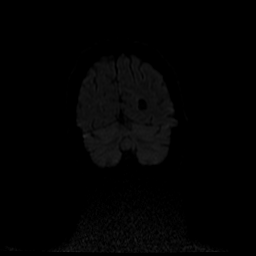
[im 66/66]
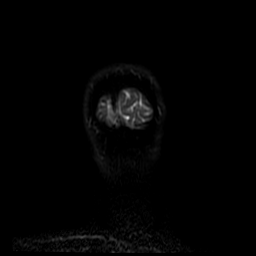

[Series 6: T2 · axial · 5.0mm · 0.43mm/px · z∈[-35,+111]mm · 2 of 26 slices shown]
[im 1/26]
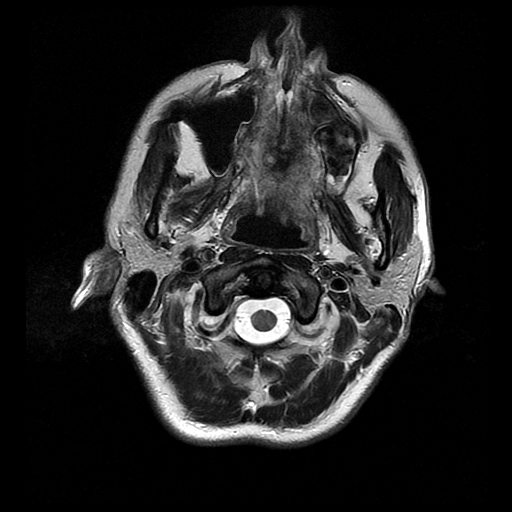
[im 26/26]
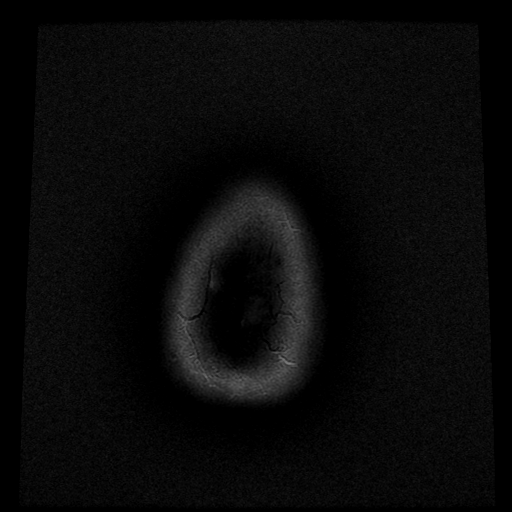

[Series 7: FLAIR · axial · 5.0mm · 0.43mm/px · z∈[-35,+111]mm · 2 of 26 slices shown]
[im 1/26]
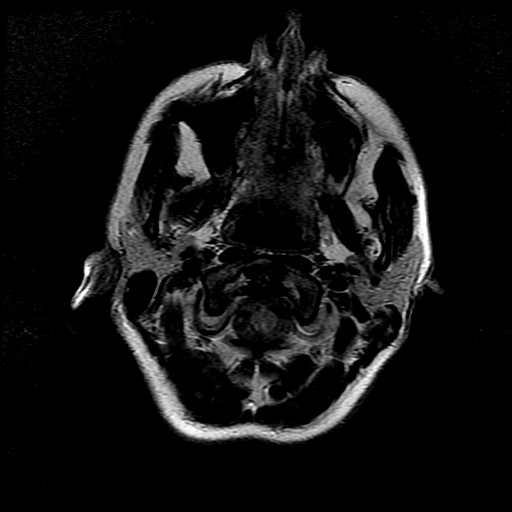
[im 26/26]
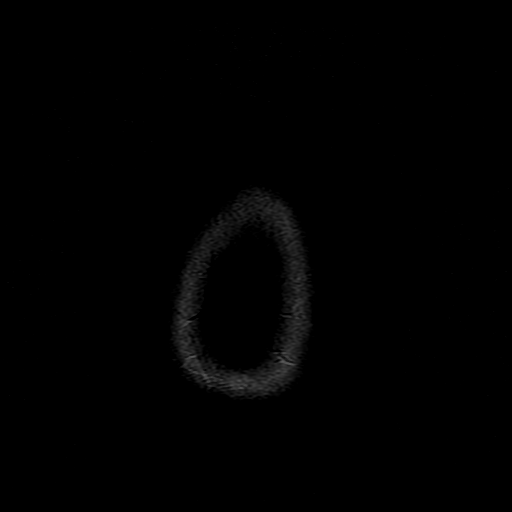

[Series 8: ax mpgr · axial · 5.0mm · 0.43mm/px · 1 of 26 slices shown]
[im 1/26]
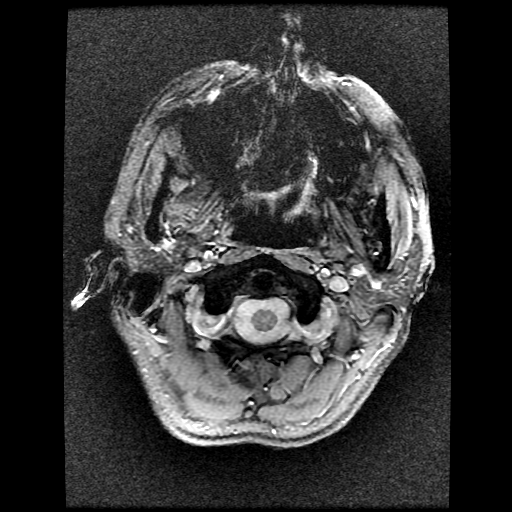

[Series 11: T2 post-contrast · coronal · 5.0mm · 0.43mm/px · 2 of 25 slices shown]
[im 1/25]
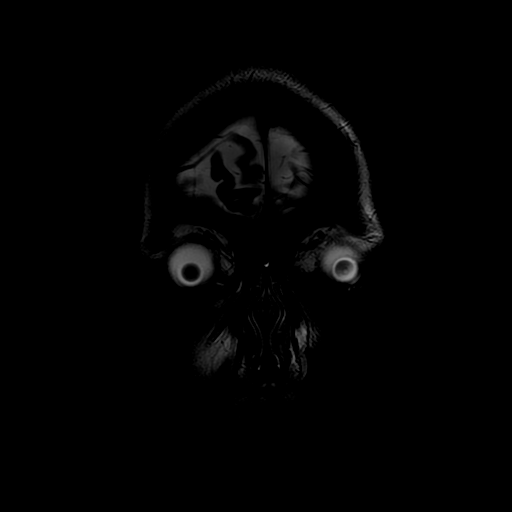
[im 25/25]
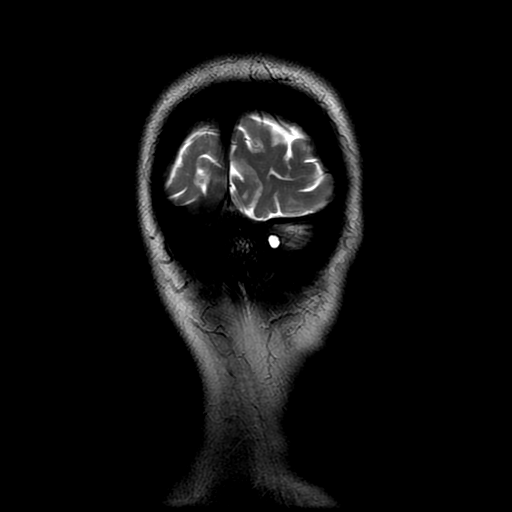

[Series 14: T1 post-contrast · coronal · 5.0mm · 0.43mm/px · 2 of 25 slices shown]
[im 1/25]
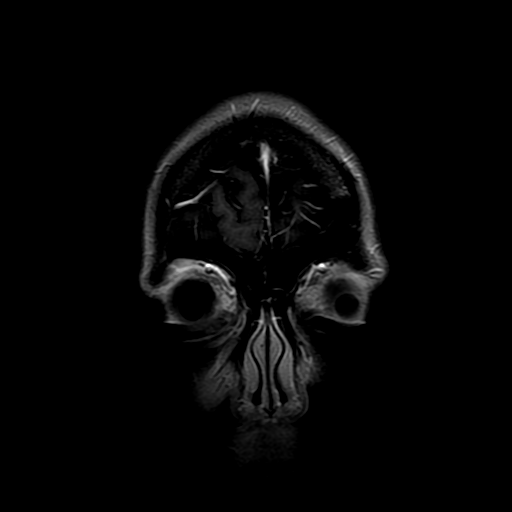
[im 25/25]
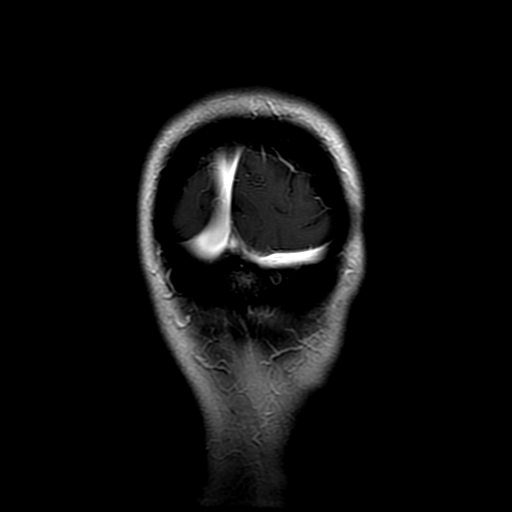

[Series 400: DWI · axial · 3.0mm · 1.09mm/px · z∈[-40,+94]mm · 4 of 47 slices shown (3 of 4)]
[im 1/47]
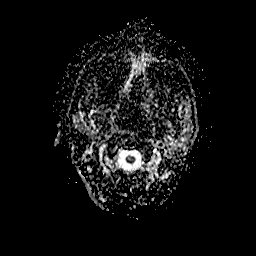
[im 16/47]
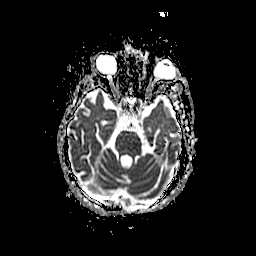
[im 31/47]
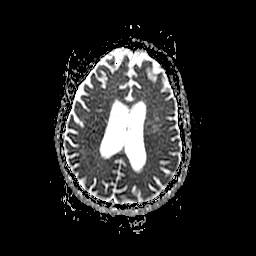
[im 47/47]
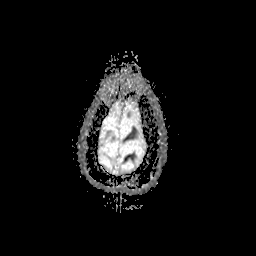

[Series 500: DWI · coronal · 5.0mm · 1.09mm/px · 3 of 33 slices shown (4 of 4)]
[im 1/33]
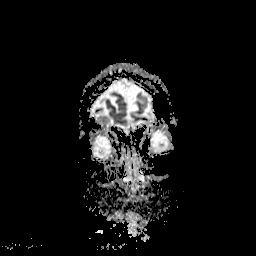
[im 17/33]
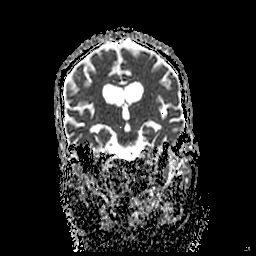
[im 33/33]
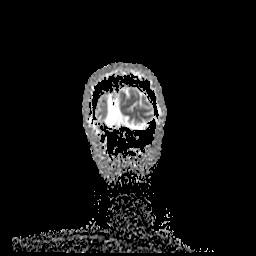

[33 of 48 positions shown; findings below may reference images not displayed]

FINDINGS: No acute stroke or hemorrhage.  No mass lesion or extra-axial fluid.

Global atrophy, hydrocephalus ex vacuo. Significant premature brain
substance loss affecting the cerebral hemispheres and cerebellum.

Premature for age subcortical greater than periventricular white
matter signal abnormality, also involving the pons. No definite
involvement of the cerebellar white matter. There is moderate
interval progression of the T2 and FLAIR hyperintensity in the pons,
and to a lesser degree, progression in the subcortical white matter,
compared with the study 1 month earlier. These show no restriction,
susceptibility, or postcontrast enhancement.

Partial empty sella. Flow voids are maintained. Extracranial soft
tissues are unremarkable.

Post infusion imaging through the entire head demonstrates no
abnormal enhancement of brain or meninges.
IMPRESSION: Interval progression of white matter signal abnormality, greatest in
the pons, compared to the study 1 month earlier. These white matter
lesions show no restriction, susceptibility, or postcontrast
enhancement. An autoimmune or paraneoplastic leukoencephalopathy
would be a reasonable consideration.

Global atrophy affecting the cerebral hemispheres, brainstem, and
cerebellum, stable from priors.

No areas of restricted diffusion or postcontrast enhancement.
